# Patient Record
Sex: Female | Born: 1980
Health system: Southern US, Community
[De-identification: ages and names within clinical notes are randomized; demographics above are authoritative.]

## PROBLEM LIST (undated history)

## (undated) ENCOUNTER — Inpatient Hospital Stay (HOSPITAL_COMMUNITY): Payer: Self-pay

## (undated) DIAGNOSIS — I10 Essential (primary) hypertension: Secondary | ICD-10-CM

## (undated) DIAGNOSIS — J45909 Unspecified asthma, uncomplicated: Secondary | ICD-10-CM

## (undated) DIAGNOSIS — R51 Headache: Secondary | ICD-10-CM

## (undated) DIAGNOSIS — D649 Anemia, unspecified: Secondary | ICD-10-CM

## (undated) DIAGNOSIS — R519 Headache, unspecified: Secondary | ICD-10-CM

## (undated) DIAGNOSIS — Z8619 Personal history of other infectious and parasitic diseases: Secondary | ICD-10-CM

## (undated) HISTORY — DX: Anemia, unspecified: D64.9

## (undated) HISTORY — PX: NO PAST SURGERIES: SHX2092

## (undated) HISTORY — DX: Personal history of other infectious and parasitic diseases: Z86.19

---

## 2004-12-06 ENCOUNTER — Ambulatory Visit (HOSPITAL_COMMUNITY): Admission: RE | Admit: 2004-12-06 | Discharge: 2004-12-06 | Payer: Self-pay | Admitting: *Deleted

## 2005-01-28 ENCOUNTER — Ambulatory Visit (HOSPITAL_COMMUNITY): Admission: RE | Admit: 2005-01-28 | Discharge: 2005-01-28 | Payer: Self-pay | Admitting: *Deleted

## 2005-05-05 ENCOUNTER — Inpatient Hospital Stay (HOSPITAL_COMMUNITY): Admission: AD | Admit: 2005-05-05 | Discharge: 2005-05-05 | Payer: Self-pay | Admitting: *Deleted

## 2005-05-05 ENCOUNTER — Ambulatory Visit: Payer: Self-pay | Admitting: Obstetrics and Gynecology

## 2005-05-05 ENCOUNTER — Inpatient Hospital Stay (HOSPITAL_COMMUNITY): Admission: AD | Admit: 2005-05-05 | Discharge: 2005-05-08 | Payer: Self-pay | Admitting: *Deleted

## 2007-01-02 ENCOUNTER — Inpatient Hospital Stay (HOSPITAL_COMMUNITY): Admission: AD | Admit: 2007-01-02 | Discharge: 2007-01-02 | Payer: Self-pay | Admitting: Obstetrics & Gynecology

## 2007-03-11 ENCOUNTER — Ambulatory Visit (HOSPITAL_COMMUNITY): Admission: RE | Admit: 2007-03-11 | Discharge: 2007-03-11 | Payer: Self-pay | Admitting: Family Medicine

## 2007-04-20 ENCOUNTER — Ambulatory Visit (HOSPITAL_COMMUNITY): Admission: RE | Admit: 2007-04-20 | Discharge: 2007-04-20 | Payer: Self-pay | Admitting: Family Medicine

## 2007-04-23 ENCOUNTER — Observation Stay: Payer: Self-pay

## 2007-05-19 ENCOUNTER — Inpatient Hospital Stay (HOSPITAL_COMMUNITY): Admission: AD | Admit: 2007-05-19 | Discharge: 2007-05-22 | Payer: Self-pay | Admitting: Obstetrics & Gynecology

## 2007-05-19 ENCOUNTER — Ambulatory Visit: Payer: Self-pay | Admitting: Obstetrics & Gynecology

## 2007-05-20 ENCOUNTER — Encounter: Payer: Self-pay | Admitting: Obstetrics & Gynecology

## 2007-05-20 DIAGNOSIS — O1415 Severe pre-eclampsia, complicating the puerperium: Secondary | ICD-10-CM

## 2009-02-19 IMAGING — US US OB FOLLOW-UP
1 series · 14 of 28 positions shown · non-contrast
Comparison: none

OBSTETRICAL ULTRASOUND:

 This ultrasound exam was performed in the [HOSPITAL] Ultrasound Department.  The OB US report was generated in the AS system, and faxed to the ordering physician.  This report is also available in [REDACTED] PACS.

[Series 1: us ob follow-up · 0.32mm/px · 14 of 38 slices shown]
[im 2/38]
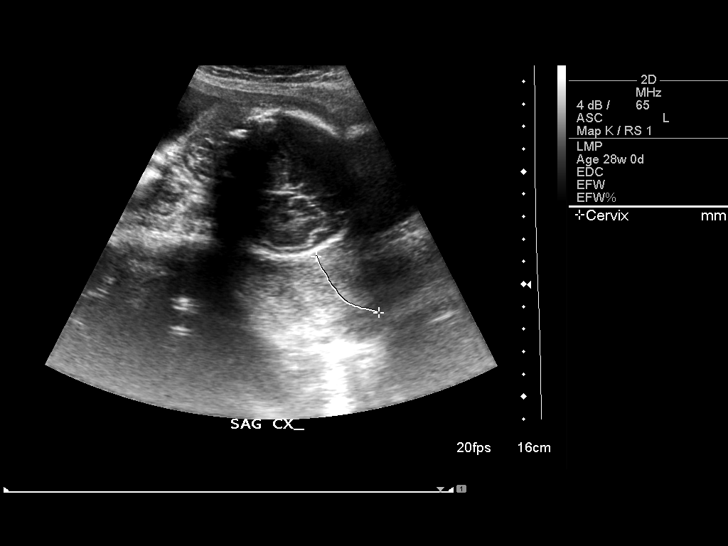
[im 5/38]
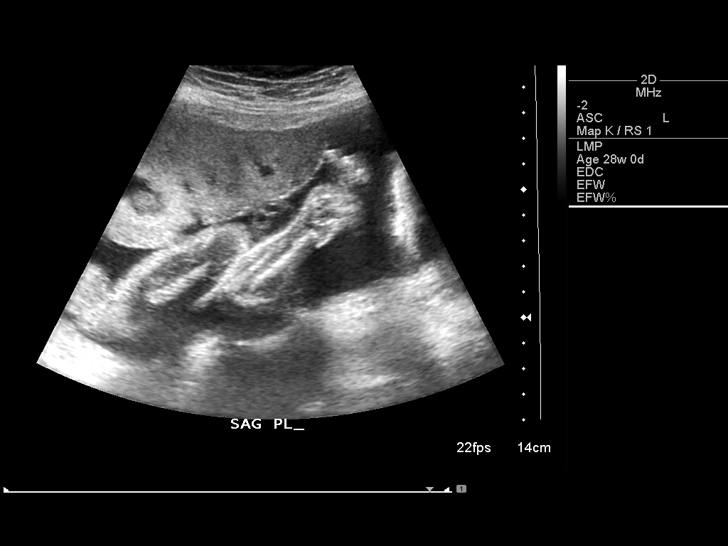
[im 7/38]
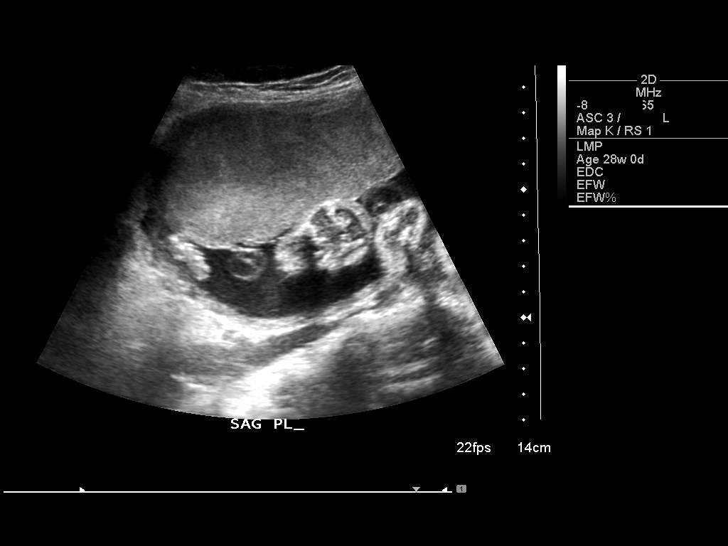
[im 10/38]
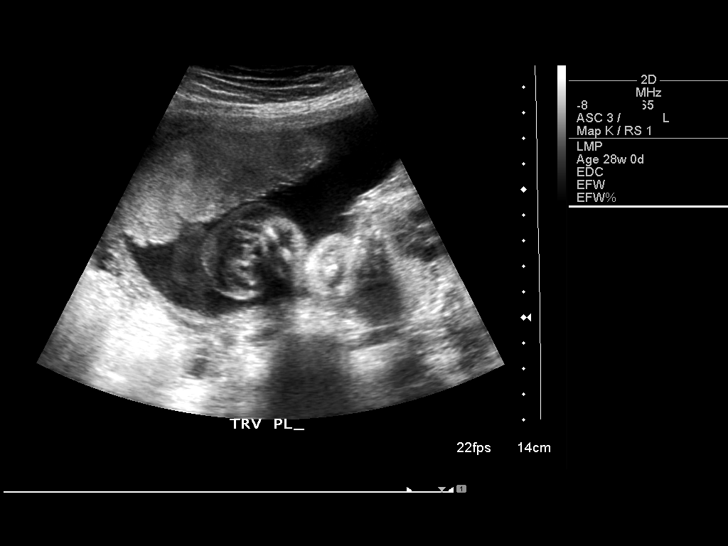
[im 13/38]
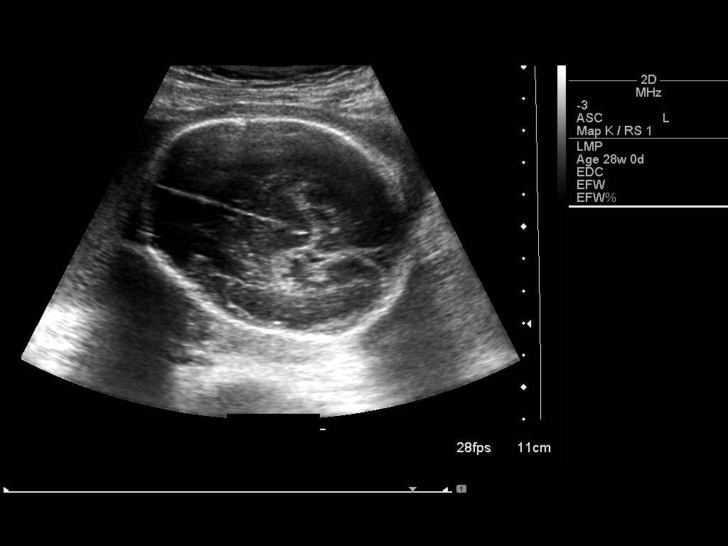
[im 16/38]
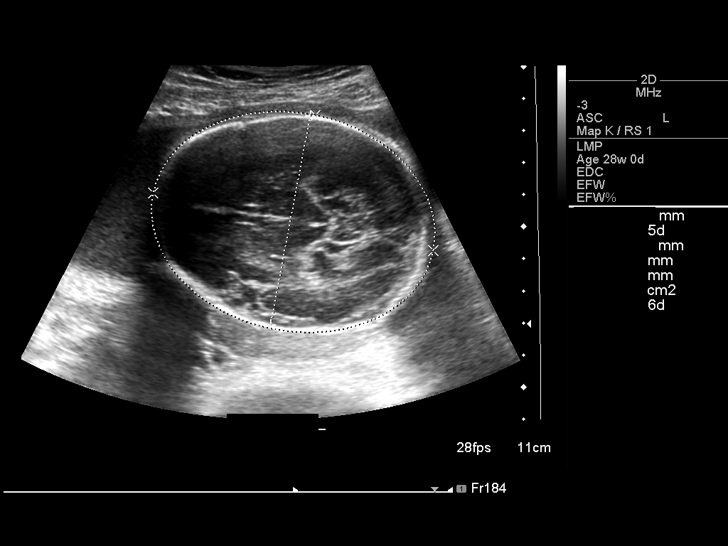
[im 18/38]
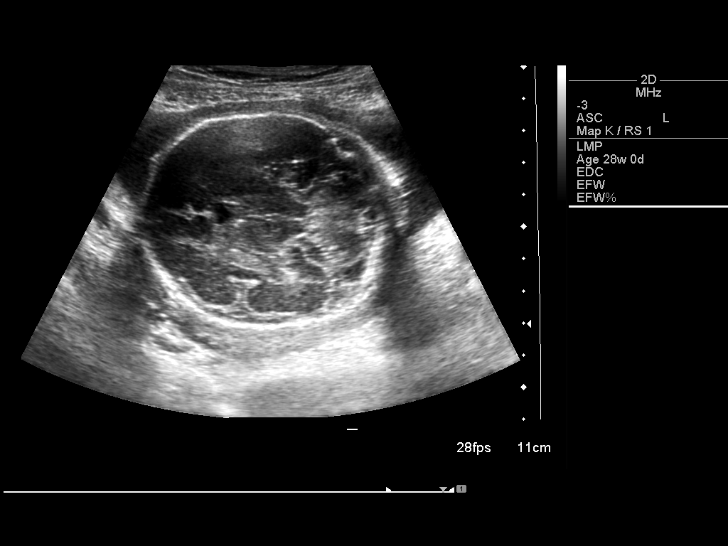
[im 21/38]
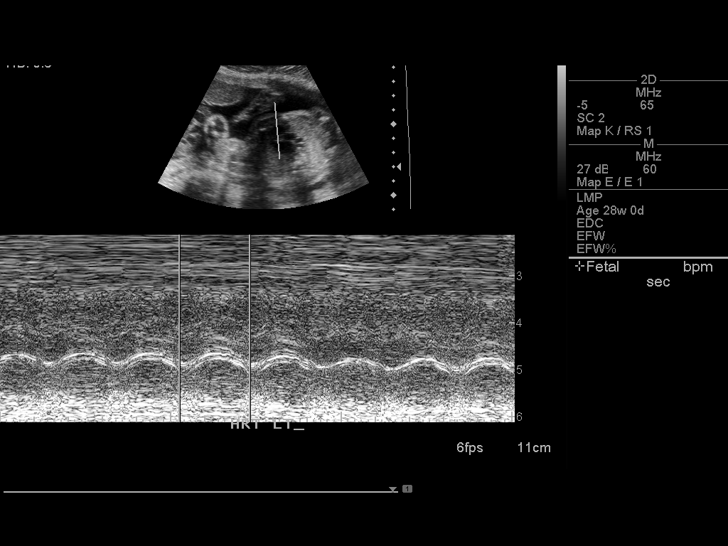
[im 24/38]
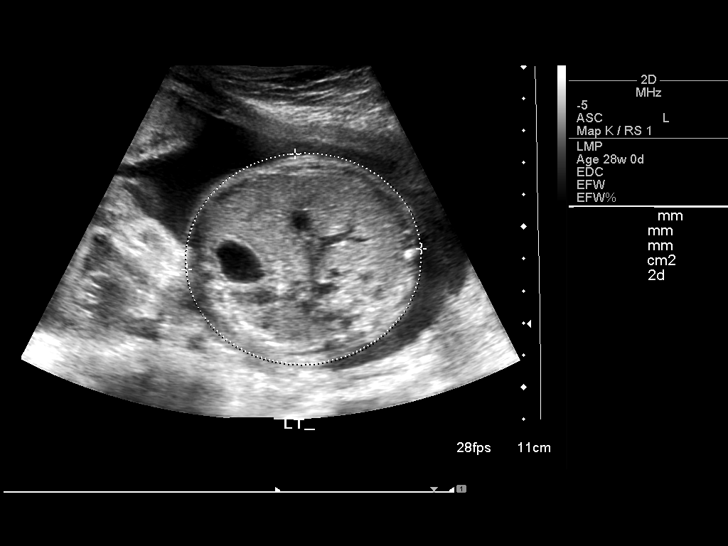
[im 27/38]
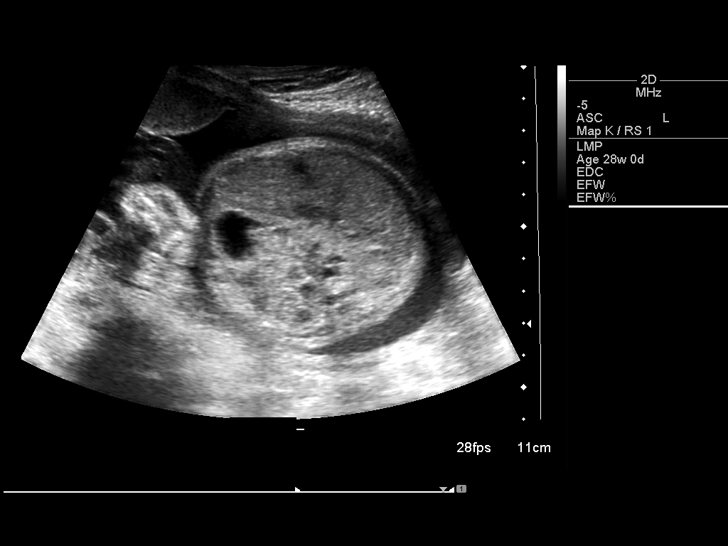
[im 29/38]
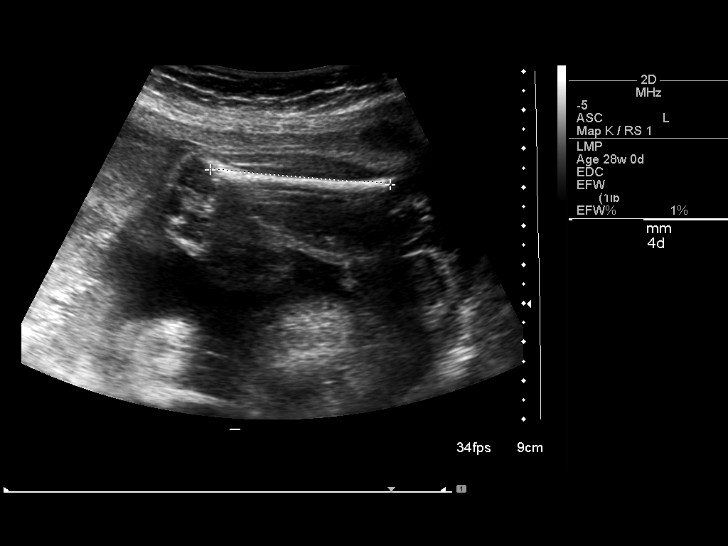
[im 32/38]
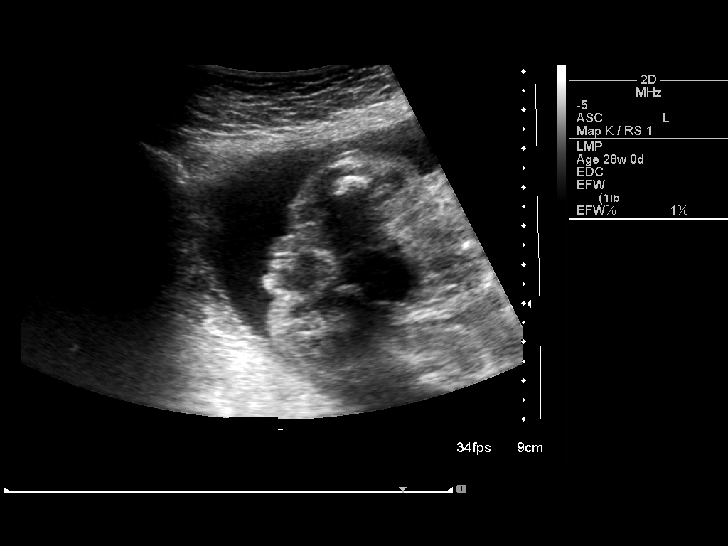
[im 35/38]
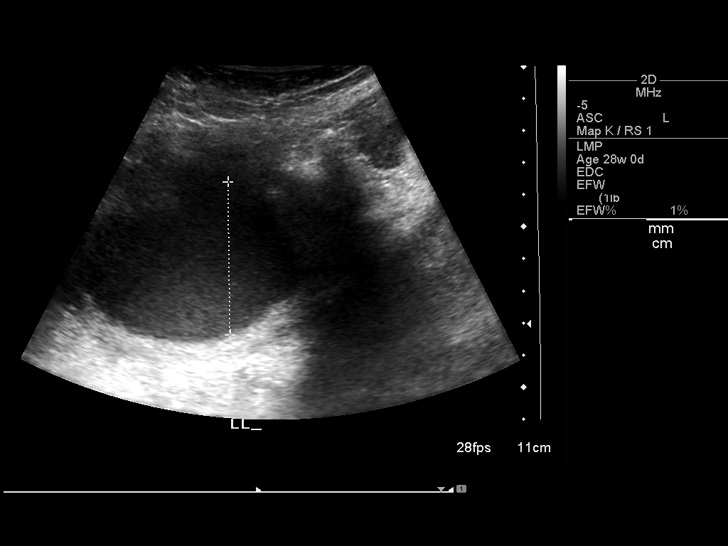
[im 38/38]
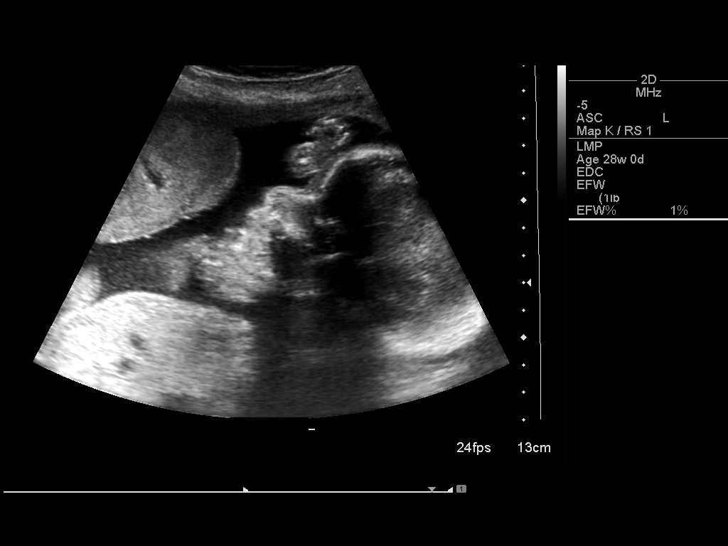

[14 of 28 positions shown; findings below may reference images not displayed]

IMPRESSION: See AS Obstetric US report.

## 2010-12-12 ENCOUNTER — Other Ambulatory Visit: Payer: Self-pay | Admitting: Family Medicine

## 2010-12-12 DIAGNOSIS — Z3689 Encounter for other specified antenatal screening: Secondary | ICD-10-CM

## 2010-12-13 ENCOUNTER — Ambulatory Visit (HOSPITAL_COMMUNITY)
Admission: RE | Admit: 2010-12-13 | Discharge: 2010-12-13 | Disposition: A | Discharge status: Home / Self Care | Payer: Medicaid Other | Source: Ambulatory Visit | Attending: Family Medicine | Admitting: Family Medicine

## 2010-12-13 DIAGNOSIS — O09299 Supervision of pregnancy with other poor reproductive or obstetric history, unspecified trimester: Secondary | ICD-10-CM | POA: Insufficient documentation

## 2010-12-13 DIAGNOSIS — Z363 Encounter for antenatal screening for malformations: Secondary | ICD-10-CM | POA: Insufficient documentation

## 2010-12-13 DIAGNOSIS — Z1389 Encounter for screening for other disorder: Secondary | ICD-10-CM | POA: Insufficient documentation

## 2010-12-13 DIAGNOSIS — Z3689 Encounter for other specified antenatal screening: Secondary | ICD-10-CM

## 2010-12-13 DIAGNOSIS — O358XX0 Maternal care for other (suspected) fetal abnormality and damage, not applicable or unspecified: Secondary | ICD-10-CM | POA: Insufficient documentation

## 2010-12-20 ENCOUNTER — Other Ambulatory Visit: Payer: Self-pay

## 2010-12-20 DIAGNOSIS — O09299 Supervision of pregnancy with other poor reproductive or obstetric history, unspecified trimester: Secondary | ICD-10-CM

## 2010-12-20 LAB — POCT URINALYSIS DIPSTICK
Nitrite: NEGATIVE
Urine Glucose, Fasting: NEGATIVE mg/dL
Urobilinogen, UA: 0.2 mg/dL (ref 0.0–1.0)
pH: 7 (ref 5.0–8.0)

## 2011-01-10 ENCOUNTER — Encounter: Payer: Self-pay | Admitting: Obstetrics & Gynecology

## 2011-01-10 ENCOUNTER — Other Ambulatory Visit: Payer: Self-pay

## 2011-01-10 DIAGNOSIS — O09299 Supervision of pregnancy with other poor reproductive or obstetric history, unspecified trimester: Secondary | ICD-10-CM

## 2011-01-10 LAB — POCT URINALYSIS DIPSTICK
Nitrite: NEGATIVE
Protein, ur: 100 mg/dL — AB
pH: 7.5 (ref 5.0–8.0)

## 2011-01-31 ENCOUNTER — Other Ambulatory Visit: Payer: Self-pay | Admitting: Family Medicine

## 2011-01-31 ENCOUNTER — Other Ambulatory Visit: Payer: Self-pay | Admitting: Obstetrics & Gynecology

## 2011-01-31 DIAGNOSIS — O09299 Supervision of pregnancy with other poor reproductive or obstetric history, unspecified trimester: Secondary | ICD-10-CM

## 2011-01-31 DIAGNOSIS — Z331 Pregnant state, incidental: Secondary | ICD-10-CM

## 2011-01-31 DIAGNOSIS — O4440 Low lying placenta NOS or without hemorrhage, unspecified trimester: Secondary | ICD-10-CM

## 2011-01-31 LAB — POCT URINALYSIS DIP (DEVICE)
Nitrite: NEGATIVE
Protein, ur: 30 mg/dL — AB
Urobilinogen, UA: 1 mg/dL (ref 0.0–1.0)

## 2011-02-21 ENCOUNTER — Other Ambulatory Visit: Payer: Self-pay | Admitting: Obstetrics & Gynecology

## 2011-02-21 ENCOUNTER — Ambulatory Visit (HOSPITAL_COMMUNITY)
Admission: RE | Admit: 2011-02-21 | Discharge: 2011-02-21 | Disposition: A | Discharge status: Home / Self Care | Payer: Medicaid Other | Source: Ambulatory Visit | Attending: Obstetrics & Gynecology | Admitting: Obstetrics & Gynecology

## 2011-02-21 DIAGNOSIS — O09299 Supervision of pregnancy with other poor reproductive or obstetric history, unspecified trimester: Secondary | ICD-10-CM

## 2011-02-21 DIAGNOSIS — O4400 Placenta previa specified as without hemorrhage, unspecified trimester: Secondary | ICD-10-CM | POA: Insufficient documentation

## 2011-02-21 DIAGNOSIS — O4440 Low lying placenta NOS or without hemorrhage, unspecified trimester: Secondary | ICD-10-CM

## 2011-02-21 DIAGNOSIS — Z331 Pregnant state, incidental: Secondary | ICD-10-CM

## 2011-02-21 LAB — POCT URINALYSIS DIP (DEVICE)
Glucose, UA: NEGATIVE mg/dL
Nitrite: NEGATIVE
Urobilinogen, UA: 0.2 mg/dL (ref 0.0–1.0)

## 2011-02-28 ENCOUNTER — Other Ambulatory Visit: Payer: Self-pay | Admitting: Family Medicine

## 2011-02-28 DIAGNOSIS — O09299 Supervision of pregnancy with other poor reproductive or obstetric history, unspecified trimester: Secondary | ICD-10-CM

## 2011-02-28 DIAGNOSIS — Z331 Pregnant state, incidental: Secondary | ICD-10-CM

## 2011-02-28 LAB — POCT URINALYSIS DIP (DEVICE)
Bilirubin Urine: NEGATIVE
Glucose, UA: NEGATIVE mg/dL
Nitrite: NEGATIVE
pH: 7.5 (ref 5.0–8.0)

## 2011-03-04 ENCOUNTER — Other Ambulatory Visit: Payer: Medicaid Other

## 2011-03-04 DIAGNOSIS — O09299 Supervision of pregnancy with other poor reproductive or obstetric history, unspecified trimester: Secondary | ICD-10-CM

## 2011-03-07 ENCOUNTER — Other Ambulatory Visit: Payer: Self-pay | Admitting: Obstetrics and Gynecology

## 2011-03-07 ENCOUNTER — Other Ambulatory Visit: Payer: Medicaid Other

## 2011-03-07 DIAGNOSIS — Z331 Pregnant state, incidental: Secondary | ICD-10-CM

## 2011-03-07 DIAGNOSIS — O09299 Supervision of pregnancy with other poor reproductive or obstetric history, unspecified trimester: Secondary | ICD-10-CM

## 2011-03-07 DIAGNOSIS — O368190 Decreased fetal movements, unspecified trimester, not applicable or unspecified: Secondary | ICD-10-CM

## 2011-03-07 LAB — POCT URINALYSIS DIP (DEVICE)
Ketones, ur: NEGATIVE mg/dL
Protein, ur: NEGATIVE mg/dL

## 2011-03-11 ENCOUNTER — Other Ambulatory Visit: Payer: Medicaid Other

## 2011-03-11 DIAGNOSIS — O09299 Supervision of pregnancy with other poor reproductive or obstetric history, unspecified trimester: Secondary | ICD-10-CM

## 2011-03-14 ENCOUNTER — Other Ambulatory Visit: Payer: Self-pay | Admitting: Physician Assistant

## 2011-03-14 ENCOUNTER — Other Ambulatory Visit: Payer: Self-pay | Admitting: Obstetrics & Gynecology

## 2011-03-14 ENCOUNTER — Other Ambulatory Visit: Payer: Medicaid Other

## 2011-03-14 DIAGNOSIS — Z331 Pregnant state, incidental: Secondary | ICD-10-CM

## 2011-03-14 DIAGNOSIS — O09299 Supervision of pregnancy with other poor reproductive or obstetric history, unspecified trimester: Secondary | ICD-10-CM

## 2011-03-14 LAB — POCT URINALYSIS DIP (DEVICE)
Glucose, UA: NEGATIVE mg/dL
Nitrite: NEGATIVE
Specific Gravity, Urine: 1.02 (ref 1.005–1.030)
Urobilinogen, UA: 0.2 mg/dL (ref 0.0–1.0)

## 2011-03-18 ENCOUNTER — Other Ambulatory Visit: Payer: Medicaid Other

## 2011-03-18 DIAGNOSIS — O09299 Supervision of pregnancy with other poor reproductive or obstetric history, unspecified trimester: Secondary | ICD-10-CM

## 2011-03-21 ENCOUNTER — Other Ambulatory Visit: Payer: Medicaid Other

## 2011-03-21 ENCOUNTER — Other Ambulatory Visit: Payer: Self-pay | Admitting: Obstetrics & Gynecology

## 2011-03-21 DIAGNOSIS — O09299 Supervision of pregnancy with other poor reproductive or obstetric history, unspecified trimester: Secondary | ICD-10-CM

## 2011-03-21 LAB — POCT URINALYSIS DIP (DEVICE)
Bilirubin Urine: NEGATIVE
Glucose, UA: NEGATIVE mg/dL
Nitrite: NEGATIVE

## 2011-03-22 ENCOUNTER — Ambulatory Visit (HOSPITAL_COMMUNITY)
Admission: RE | Admit: 2011-03-22 | Discharge: 2011-03-22 | Disposition: A | Discharge status: Home / Self Care | Payer: Medicaid Other | Source: Ambulatory Visit | Attending: Obstetrics & Gynecology | Admitting: Obstetrics & Gynecology

## 2011-03-22 DIAGNOSIS — Z3689 Encounter for other specified antenatal screening: Secondary | ICD-10-CM | POA: Insufficient documentation

## 2011-03-22 DIAGNOSIS — O09299 Supervision of pregnancy with other poor reproductive or obstetric history, unspecified trimester: Secondary | ICD-10-CM

## 2011-03-25 ENCOUNTER — Other Ambulatory Visit: Payer: Medicaid Other

## 2011-03-25 DIAGNOSIS — O09299 Supervision of pregnancy with other poor reproductive or obstetric history, unspecified trimester: Secondary | ICD-10-CM

## 2011-03-28 ENCOUNTER — Other Ambulatory Visit: Payer: Self-pay | Admitting: Obstetrics and Gynecology

## 2011-03-28 ENCOUNTER — Other Ambulatory Visit: Payer: Medicaid Other

## 2011-03-28 DIAGNOSIS — O09299 Supervision of pregnancy with other poor reproductive or obstetric history, unspecified trimester: Secondary | ICD-10-CM

## 2011-03-28 LAB — POCT URINALYSIS DIP (DEVICE)
Bilirubin Urine: NEGATIVE
Glucose, UA: NEGATIVE mg/dL
Ketones, ur: NEGATIVE mg/dL

## 2011-04-02 ENCOUNTER — Other Ambulatory Visit: Payer: Medicaid Other

## 2011-04-02 DIAGNOSIS — O09299 Supervision of pregnancy with other poor reproductive or obstetric history, unspecified trimester: Secondary | ICD-10-CM

## 2011-04-04 ENCOUNTER — Other Ambulatory Visit: Payer: Self-pay | Admitting: Obstetrics and Gynecology

## 2011-04-04 ENCOUNTER — Other Ambulatory Visit: Payer: Medicaid Other

## 2011-04-04 DIAGNOSIS — O09299 Supervision of pregnancy with other poor reproductive or obstetric history, unspecified trimester: Secondary | ICD-10-CM

## 2011-04-04 LAB — POCT URINALYSIS DIP (DEVICE)
Bilirubin Urine: NEGATIVE
Glucose, UA: NEGATIVE mg/dL
Ketones, ur: NEGATIVE mg/dL
Specific Gravity, Urine: 1.015 (ref 1.005–1.030)

## 2011-04-08 ENCOUNTER — Other Ambulatory Visit: Payer: Medicaid Other

## 2011-04-08 DIAGNOSIS — O09299 Supervision of pregnancy with other poor reproductive or obstetric history, unspecified trimester: Secondary | ICD-10-CM

## 2011-04-11 ENCOUNTER — Other Ambulatory Visit: Payer: Medicaid Other

## 2011-04-11 ENCOUNTER — Other Ambulatory Visit: Payer: Self-pay | Admitting: Obstetrics & Gynecology

## 2011-04-11 DIAGNOSIS — O09299 Supervision of pregnancy with other poor reproductive or obstetric history, unspecified trimester: Secondary | ICD-10-CM

## 2011-04-11 LAB — POCT URINALYSIS DIP (DEVICE)
Bilirubin Urine: NEGATIVE
Glucose, UA: NEGATIVE mg/dL
Specific Gravity, Urine: 1.01 (ref 1.005–1.030)

## 2011-04-15 ENCOUNTER — Other Ambulatory Visit: Payer: Self-pay | Admitting: Obstetrics & Gynecology

## 2011-04-15 ENCOUNTER — Other Ambulatory Visit: Payer: Medicaid Other

## 2011-04-15 DIAGNOSIS — O4190X0 Disorder of amniotic fluid and membranes, unspecified, unspecified trimester, not applicable or unspecified: Secondary | ICD-10-CM

## 2011-04-15 DIAGNOSIS — O09299 Supervision of pregnancy with other poor reproductive or obstetric history, unspecified trimester: Secondary | ICD-10-CM

## 2011-04-16 ENCOUNTER — Ambulatory Visit (HOSPITAL_COMMUNITY): Payer: Medicaid Other

## 2011-04-18 ENCOUNTER — Ambulatory Visit (HOSPITAL_COMMUNITY)
Admission: RE | Admit: 2011-04-18 | Discharge: 2011-04-18 | Disposition: A | Discharge status: Home / Self Care | Payer: Medicaid Other | Source: Ambulatory Visit | Attending: Obstetrics and Gynecology | Admitting: Obstetrics and Gynecology

## 2011-04-18 ENCOUNTER — Other Ambulatory Visit: Payer: Self-pay | Admitting: Obstetrics & Gynecology

## 2011-04-18 ENCOUNTER — Other Ambulatory Visit: Payer: Medicaid Other

## 2011-04-18 DIAGNOSIS — O09299 Supervision of pregnancy with other poor reproductive or obstetric history, unspecified trimester: Secondary | ICD-10-CM | POA: Insufficient documentation

## 2011-04-18 DIAGNOSIS — Z3689 Encounter for other specified antenatal screening: Secondary | ICD-10-CM | POA: Insufficient documentation

## 2011-04-18 LAB — POCT URINALYSIS DIP (DEVICE)
Bilirubin Urine: NEGATIVE
Glucose, UA: NEGATIVE mg/dL
Ketones, ur: NEGATIVE mg/dL
Leukocytes, UA: NEGATIVE
pH: 7 (ref 5.0–8.0)

## 2011-04-22 ENCOUNTER — Other Ambulatory Visit: Payer: Medicaid Other

## 2011-04-22 DIAGNOSIS — O09299 Supervision of pregnancy with other poor reproductive or obstetric history, unspecified trimester: Secondary | ICD-10-CM

## 2011-04-25 ENCOUNTER — Other Ambulatory Visit: Payer: Self-pay | Admitting: Family Medicine

## 2011-04-25 ENCOUNTER — Ambulatory Visit (HOSPITAL_COMMUNITY)
Admission: RE | Admit: 2011-04-25 | Discharge: 2011-04-25 | Disposition: A | Discharge status: Home / Self Care | Payer: Medicaid Other | Source: Ambulatory Visit | Attending: Obstetrics & Gynecology | Admitting: Obstetrics & Gynecology

## 2011-04-25 ENCOUNTER — Other Ambulatory Visit: Payer: Medicaid Other

## 2011-04-25 DIAGNOSIS — O09299 Supervision of pregnancy with other poor reproductive or obstetric history, unspecified trimester: Secondary | ICD-10-CM

## 2011-04-25 DIAGNOSIS — O4190X0 Disorder of amniotic fluid and membranes, unspecified, unspecified trimester, not applicable or unspecified: Secondary | ICD-10-CM

## 2011-04-25 DIAGNOSIS — Z3689 Encounter for other specified antenatal screening: Secondary | ICD-10-CM | POA: Insufficient documentation

## 2011-04-25 LAB — POCT URINALYSIS DIP (DEVICE)
Bilirubin Urine: NEGATIVE
Ketones, ur: NEGATIVE mg/dL
Protein, ur: NEGATIVE mg/dL
Specific Gravity, Urine: 1.01 (ref 1.005–1.030)

## 2011-04-29 ENCOUNTER — Other Ambulatory Visit: Payer: Medicaid Other

## 2011-04-29 DIAGNOSIS — O09299 Supervision of pregnancy with other poor reproductive or obstetric history, unspecified trimester: Secondary | ICD-10-CM

## 2011-05-02 ENCOUNTER — Other Ambulatory Visit: Payer: Medicaid Other

## 2011-05-02 ENCOUNTER — Other Ambulatory Visit: Payer: Self-pay | Admitting: Obstetrics & Gynecology

## 2011-05-02 ENCOUNTER — Ambulatory Visit (HOSPITAL_COMMUNITY)
Admission: RE | Admit: 2011-05-02 | Discharge: 2011-05-02 | Disposition: A | Discharge status: Home / Self Care | Payer: Medicaid Other | Source: Ambulatory Visit | Attending: Obstetrics & Gynecology | Admitting: Obstetrics & Gynecology

## 2011-05-02 DIAGNOSIS — Z3689 Encounter for other specified antenatal screening: Secondary | ICD-10-CM | POA: Insufficient documentation

## 2011-05-02 DIAGNOSIS — O4190X0 Disorder of amniotic fluid and membranes, unspecified, unspecified trimester, not applicable or unspecified: Secondary | ICD-10-CM

## 2011-05-02 DIAGNOSIS — O09299 Supervision of pregnancy with other poor reproductive or obstetric history, unspecified trimester: Secondary | ICD-10-CM

## 2011-05-05 ENCOUNTER — Inpatient Hospital Stay (HOSPITAL_COMMUNITY)
Admission: AD | Admit: 2011-05-05 | Discharge: 2011-05-07 | DRG: 775 | Disposition: A | Discharge status: Home / Self Care | Payer: Medicaid Other | Source: Ambulatory Visit | Attending: Obstetrics & Gynecology | Admitting: Obstetrics & Gynecology

## 2011-05-05 LAB — CBC
HCT: 33.2 % — ABNORMAL LOW (ref 36.0–46.0)
Hemoglobin: 10.8 g/dL — ABNORMAL LOW (ref 12.0–15.0)
MCV: 76 fL — ABNORMAL LOW (ref 78.0–100.0)
RDW: 15 % (ref 11.5–15.5)
WBC: 10.3 10*3/uL (ref 4.0–10.5)

## 2011-05-05 LAB — RPR: RPR Ser Ql: NONREACTIVE

## 2011-05-06 ENCOUNTER — Other Ambulatory Visit: Payer: Medicaid Other

## 2011-05-06 LAB — CBC
Hemoglobin: 9.5 g/dL — ABNORMAL LOW (ref 12.0–15.0)
MCH: 24.7 pg — ABNORMAL LOW (ref 26.0–34.0)
MCHC: 32.4 g/dL (ref 30.0–36.0)
RDW: 15 % (ref 11.5–15.5)

## 2011-05-31 DIAGNOSIS — F32A Depression, unspecified: Secondary | ICD-10-CM

## 2011-05-31 DIAGNOSIS — F329 Major depressive disorder, single episode, unspecified: Secondary | ICD-10-CM | POA: Insufficient documentation

## 2011-06-12 ENCOUNTER — Ambulatory Visit (INDEPENDENT_AMBULATORY_CARE_PROVIDER_SITE_OTHER): Payer: Medicaid Other | Admitting: Obstetrics and Gynecology

## 2011-06-12 ENCOUNTER — Encounter: Payer: Self-pay | Admitting: Obstetrics and Gynecology

## 2011-06-12 MED ORDER — NATALCARE PIC 60-1 MG PO TABS: 1.0000 | ORAL_TABLET | Freq: Every day | ORAL | Status: DC

## 2011-06-12 NOTE — Progress Notes (Signed)
Subjective:     Patient ID: Danielle Hoover, female   DOB: 1980/12/15, 30 y.o.   MRN: [id number]  HPI the patient is a gravida 3 para 12/05/2000 and delivered on July 1 L. infant weighing 6 lbs. 14 oz. Apgar 99. Patient had no tears delivery went fine and she has done well with her nursing the past 6 weeks her only complaint is a mild vaginal itch. She did not need a Pap smear in collects February her last 1 was normal   Review of Systems Review of systems is negative with exception of present illness    Objective:   Physical Exam Patient's blood pressure was normal at 107/76 pulse is 86.  Reason is a well-developed [place] young woman in no distress. Breasts are lactating symmetrical no masses. Abdomen is soft nontender without masses no organomegaly. Genitalia external normal BUS within normal limits vagina is clean and well rugated cervix is clean and parous no significant vaginal discharge wet prep was taken. Uterus is normal size shape consistency.    Assessment:    normal postpartum examination    Plan:     Patient wants no contraception so we've told her to continue on her birth control pills and was sent in order to her pharmacy.

## 2011-06-14 LAB — WET PREP, GENITAL
Trich, Wet Prep: NONE SEEN
Yeast Wet Prep HPF POC: NONE SEEN

## 2011-06-16 ENCOUNTER — Inpatient Hospital Stay (INDEPENDENT_AMBULATORY_CARE_PROVIDER_SITE_OTHER)
Admission: RE | Admit: 2011-06-16 | Discharge: 2011-06-16 | Disposition: A | Discharge status: Home / Self Care | Payer: Medicaid Other | Source: Ambulatory Visit | Attending: Family Medicine | Admitting: Family Medicine

## 2011-06-16 ENCOUNTER — Emergency Department (HOSPITAL_COMMUNITY): Payer: Medicaid Other

## 2011-06-16 ENCOUNTER — Emergency Department (HOSPITAL_COMMUNITY)
Admission: EM | Admit: 2011-06-16 | Discharge: 2011-06-17 | Disposition: A | Discharge status: Home / Self Care | Payer: Medicaid Other | Attending: Emergency Medicine | Admitting: Emergency Medicine

## 2011-06-16 DIAGNOSIS — R109 Unspecified abdominal pain: Secondary | ICD-10-CM | POA: Insufficient documentation

## 2011-06-16 DIAGNOSIS — R1011 Right upper quadrant pain: Secondary | ICD-10-CM

## 2011-06-16 DIAGNOSIS — R319 Hematuria, unspecified: Secondary | ICD-10-CM

## 2011-06-16 DIAGNOSIS — K819 Cholecystitis, unspecified: Secondary | ICD-10-CM | POA: Insufficient documentation

## 2011-06-16 LAB — POCT URINALYSIS DIP (DEVICE)
Bilirubin Urine: NEGATIVE
Bilirubin Urine: NEGATIVE
Glucose, UA: NEGATIVE mg/dL
Glucose, UA: NEGATIVE mg/dL
Ketones, ur: NEGATIVE mg/dL
Leukocytes, UA: NEGATIVE
Nitrite: NEGATIVE
Nitrite: NEGATIVE
Protein, ur: 30 mg/dL — AB
Specific Gravity, Urine: 1.02 (ref 1.005–1.030)
Specific Gravity, Urine: 1.02 (ref 1.005–1.030)
Urobilinogen, UA: 0.2 mg/dL (ref 0.0–1.0)
Urobilinogen, UA: 0.2 mg/dL (ref 0.0–1.0)
pH: 7 (ref 5.0–8.0)
pH: 7.5 (ref 5.0–8.0)

## 2011-06-17 LAB — CBC
HCT: 36.4 % (ref 36.0–46.0)
Hemoglobin: 12.2 g/dL (ref 12.0–15.0)
MCH: 24.4 pg — ABNORMAL LOW (ref 26.0–34.0)
MCHC: 33.5 g/dL (ref 30.0–36.0)
MCV: 72.9 fL — ABNORMAL LOW (ref 78.0–100.0)
RDW: 15.1 % (ref 11.5–15.5)

## 2011-06-17 LAB — URINALYSIS, ROUTINE W REFLEX MICROSCOPIC
Bilirubin Urine: NEGATIVE
Leukocytes, UA: NEGATIVE
Nitrite: NEGATIVE
Specific Gravity, Urine: 1.018 (ref 1.005–1.030)
Urobilinogen, UA: 0.2 mg/dL (ref 0.0–1.0)

## 2011-06-17 LAB — COMPREHENSIVE METABOLIC PANEL
Alkaline Phosphatase: 103 U/L (ref 39–117)
BUN: 9 mg/dL (ref 6–23)
Calcium: 9.3 mg/dL (ref 8.4–10.5)
Creatinine, Ser: <0.47 mg/dL — ABNORMAL LOW (ref 0.50–1.10)
Glucose, Bld: 93 mg/dL (ref 70–99)
Total Protein: 6.6 g/dL (ref 6.0–8.3)

## 2011-06-17 LAB — URINE MICROSCOPIC-ADD ON

## 2011-06-17 LAB — DIFFERENTIAL
Eosinophils Relative: 4 % (ref 0–5)
Lymphocytes Relative: 31 % (ref 12–46)
Monocytes Absolute: 0.5 10*3/uL (ref 0.1–1.0)
Monocytes Relative: 5 % (ref 3–12)
Neutro Abs: 6 10*3/uL (ref 1.7–7.7)

## 2011-06-17 LAB — LIPASE, BLOOD: Lipase: 34 U/L (ref 11–59)

## 2011-08-19 LAB — COMPREHENSIVE METABOLIC PANEL
ALT: 13
AST: 23
Albumin: 2.1 — ABNORMAL LOW
Alkaline Phosphatase: 281 — ABNORMAL HIGH
BUN: 5 — ABNORMAL LOW
CO2: 27
Calcium: 6.8 — ABNORMAL LOW
Calcium: 8.2 — ABNORMAL LOW
Chloride: 104
Creatinine, Ser: 0.49
GFR calc Af Amer: >60
GFR calc non Af Amer: >60
Glucose, Bld: 83
Potassium: 3.8
Total Bilirubin: 0.4
Total Protein: 5.5 — ABNORMAL LOW

## 2011-08-19 LAB — TYPE AND SCREEN
ABO/RH(D): AB POS
PT AG Type: NEGATIVE

## 2011-08-19 LAB — CBC
HCT: 31.2 — ABNORMAL LOW
HCT: 32.5 — ABNORMAL LOW
Hemoglobin: 10.9 — ABNORMAL LOW
MCHC: 32.8
MCHC: 33.4
MCV: 77.4 — ABNORMAL LOW
Platelets: 231
RBC: 4.03
RDW: 14.1 — ABNORMAL HIGH
WBC: 11.7 — ABNORMAL HIGH

## 2011-08-19 LAB — TORCH-IGM(TOXO/ RUB/ CMV/ HSV) W TITER
HSV IgM Antibody Titer: 0.54 IV
Rubella IgM Index: 0.39 IV
Toxoplasma IgM: 0.48 IV

## 2011-08-19 LAB — URINALYSIS, DIPSTICK ONLY
Bilirubin Urine: NEGATIVE
Glucose, UA: NEGATIVE
Protein, ur: 100 — AB
Urobilinogen, UA: 0.2

## 2011-08-19 LAB — CARDIAC PANEL(CRET KIN+CKTOT+MB+TROPI)
CK, MB: 0.8
CK, MB: 1.4
Relative Index: INVALID
Total CK: 75
Total CK: 76
Troponin I: 0.02
Troponin I: 0.04

## 2011-08-19 LAB — MAGNESIUM: Magnesium: 6 — ABNORMAL HIGH

## 2011-08-19 LAB — URIC ACID: Uric Acid, Serum: 7

## 2011-08-19 LAB — TORCH TITERS-IGG(TOXO/ RUB/ CMV/ HSV): Rubella IgG Scr: 15 IU/mL

## 2011-08-19 LAB — LACTATE DEHYDROGENASE: LDH: 180

## 2014-08-15 ENCOUNTER — Inpatient Hospital Stay (HOSPITAL_COMMUNITY)
Admission: AD | Admit: 2014-08-15 | Discharge: 2014-08-15 | Disposition: A | Discharge status: Home / Self Care | Payer: BC Managed Care – PPO | Source: Ambulatory Visit | Attending: Family Medicine | Admitting: Family Medicine

## 2014-08-15 ENCOUNTER — Inpatient Hospital Stay (HOSPITAL_COMMUNITY): Payer: BC Managed Care – PPO

## 2014-08-15 ENCOUNTER — Encounter (HOSPITAL_COMMUNITY): Payer: Self-pay | Admitting: *Deleted

## 2014-08-15 DIAGNOSIS — O468X1 Other antepartum hemorrhage, first trimester: Secondary | ICD-10-CM

## 2014-08-15 DIAGNOSIS — Z3A01 Less than 8 weeks gestation of pregnancy: Secondary | ICD-10-CM | POA: Diagnosis not present

## 2014-08-15 DIAGNOSIS — O208 Other hemorrhage in early pregnancy: Secondary | ICD-10-CM | POA: Diagnosis not present

## 2014-08-15 DIAGNOSIS — O418X10 Other specified disorders of amniotic fluid and membranes, first trimester, not applicable or unspecified: Secondary | ICD-10-CM

## 2014-08-15 DIAGNOSIS — O4691 Antepartum hemorrhage, unspecified, first trimester: Secondary | ICD-10-CM

## 2014-08-15 DIAGNOSIS — R1033 Periumbilical pain: Secondary | ICD-10-CM | POA: Insufficient documentation

## 2014-08-15 DIAGNOSIS — R109 Unspecified abdominal pain: Secondary | ICD-10-CM | POA: Diagnosis present

## 2014-08-15 DIAGNOSIS — O209 Hemorrhage in early pregnancy, unspecified: Secondary | ICD-10-CM

## 2014-08-15 HISTORY — DX: Unspecified asthma, uncomplicated: J45.909

## 2014-08-15 HISTORY — DX: Headache, unspecified: R51.9

## 2014-08-15 HISTORY — DX: Headache: R51

## 2014-08-15 LAB — CBC
HEMATOCRIT: 39.5 % (ref 36.0–46.0)
Hemoglobin: 13.7 g/dL (ref 12.0–15.0)
MCH: 26.6 pg (ref 26.0–34.0)
MCHC: 34.7 g/dL (ref 30.0–36.0)
MCV: 76.7 fL — AB (ref 78.0–100.0)
Platelets: 287 10*3/uL (ref 150–400)
RBC: 5.15 MIL/uL — ABNORMAL HIGH (ref 3.87–5.11)
RDW: 14.4 % (ref 11.5–15.5)
WBC: 10 10*3/uL (ref 4.0–10.5)

## 2014-08-15 LAB — URINALYSIS, ROUTINE W REFLEX MICROSCOPIC
Bilirubin Urine: NEGATIVE
GLUCOSE, UA: NEGATIVE mg/dL
Ketones, ur: NEGATIVE mg/dL
NITRITE: NEGATIVE
PH: 6.5 (ref 5.0–8.0)
Protein, ur: NEGATIVE mg/dL
Specific Gravity, Urine: <1.005 — ABNORMAL LOW (ref 1.005–1.030)
Urobilinogen, UA: 0.2 mg/dL (ref 0.0–1.0)

## 2014-08-15 LAB — WET PREP, GENITAL
CLUE CELLS WET PREP: NONE SEEN
Trich, Wet Prep: NONE SEEN
Yeast Wet Prep HPF POC: NONE SEEN

## 2014-08-15 LAB — URINE MICROSCOPIC-ADD ON

## 2014-08-15 LAB — HIV ANTIBODY (ROUTINE TESTING W REFLEX): HIV 1&2 Ab, 4th Generation: NONREACTIVE

## 2014-08-15 LAB — POCT PREGNANCY, URINE: Preg Test, Ur: POSITIVE — AB

## 2014-08-15 LAB — HCG, QUANTITATIVE, PREGNANCY: HCG, BETA CHAIN, QUANT, S: 125952 m[IU]/mL — AB (ref <5)

## 2014-08-15 NOTE — Discharge Instructions (Signed)

## 2014-08-15 NOTE — MAU Note (Signed)
Sees blood when wiping post voiding.

## 2014-08-15 NOTE — MAU Provider Note (Signed)
Chief Complaint: Abdominal Pain   None    SUBJECTIVE HPI: Danielle Hoover is a 33 y.o. [id number] at Unknown by LMP who presents to maternity admissions reporting abdominal cramping off and on x 2-3 days.  Today she noticed light pink spotting when wiping.  Most of her pain is low in the front of her abdomen but she also has umbilical pain and burning pain in her chest which are both intermittent.  She denies vaginal bleeding, vaginal itching/burning, urinary symptoms, h/a, dizziness, n/v, or fever/chills.    Past Medical History  Diagnosis Date  . Anemia     during pregnancy 2006  . Headache   . Asthma    Past Surgical History  Procedure Laterality Date  . No past surgeries     History   Social History  . Marital Status: Legally Separated    Spouse Name: N/A    Number of Children: N/A  . Years of Education: N/A   Occupational History  . Not on file.   Social History Main Topics  . Smoking status: Never Smoker   . Smokeless tobacco: Never Used  . Alcohol Use: No  . Drug Use: No  . Sexual Activity: Yes    Birth Control/ Protection: None     Comment: last week   Other Topics Concern  . Not on file   Social History Narrative  . No narrative on file   No current facility-administered medications on file prior to encounter.   No current outpatient prescriptions on file prior to encounter.   No Known Allergies  ROS: Pertinent items in HPI  OBJECTIVE Blood pressure 119/69, pulse 80, temperature 98.5 F (36.9 C), temperature source Oral, resp. rate 16, height 4\' 10"  (1.473 m), weight 46.777 kg (103 lb 2 oz), last menstrual period 06/21/2014, not currently breastfeeding. GENERAL: Well-developed, well-nourished female in no acute distress.  HEENT: Normocephalic HEART: normal rate RESP: normal effort ABDOMEN: Soft, non-tender EXTREMITIES: Nontender, no edema NEURO: Alert and oriented Pelvic exam: Cervix pink, visually closed, without lesion, small amount thin clear discharge,  vaginal walls and external genitalia normal Bimanual exam: Cervix 0/long/high, firm, anterior, positive CMT, uterus mildly tender, slightly enlarged, adnexa without tenderness, enlargement, or mass  LAB RESULTS Results for orders placed during the hospital encounter of 08/15/14 (from the past 24 hour(s))  URINALYSIS, ROUTINE W REFLEX MICROSCOPIC     Status: Abnormal   Collection Time    08/15/14  3:30 PM      Result Value Ref Range   Color, Urine STRAW (*) YELLOW   APPearance CLEAR  CLEAR   Specific Gravity, Urine <1.005 (*) 1.005 - 1.030   pH 6.5  5.0 - 8.0   Glucose, UA NEGATIVE  NEGATIVE mg/dL   Hgb urine dipstick LARGE (*) NEGATIVE   Bilirubin Urine NEGATIVE  NEGATIVE   Ketones, ur NEGATIVE  NEGATIVE mg/dL   Protein, ur NEGATIVE  NEGATIVE mg/dL   Urobilinogen, UA 0.2  0.0 - 1.0 mg/dL   Nitrite NEGATIVE  NEGATIVE   Leukocytes, UA TRACE (*) NEGATIVE  URINE MICROSCOPIC-ADD ON     Status: Abnormal   Collection Time    08/15/14  3:30 PM      Result Value Ref Range   Squamous Epithelial / LPF FEW (*) RARE   WBC, UA 0-2  <3 WBC/hpf   RBC / HPF 0-2  <3 RBC/hpf  POCT PREGNANCY, URINE     Status: Abnormal   Collection Time    08/15/14  3:40 PM  Result Value Ref Range   Preg Test, Ur POSITIVE (*) NEGATIVE  HCG, QUANTITATIVE, PREGNANCY     Status: Abnormal   Collection Time    08/15/14  4:10 PM      Result Value Ref Range   hCG, Beta Francene Finders [id number] (*) <5 mIU/mL  CBC     Status: Abnormal   Collection Time    08/15/14  4:10 PM      Result Value Ref Range   WBC 10.0  4.0 - 10.5 K/uL   RBC 5.15 (*) 3.87 - 5.11 MIL/uL   Hemoglobin 13.7  12.0 - 15.0 g/dL   HCT 78.2  95.6 - 21.3 %   MCV 76.7 (*) 78.0 - 100.0 fL   MCH 26.6  26.0 - 34.0 pg   MCHC 34.7  30.0 - 36.0 g/dL   RDW 08.6  57.8 - 46.9 %   Platelets 287  150 - 400 K/uL  WET PREP, GENITAL     Status: Abnormal   Collection Time    08/15/14  4:16 PM      Result Value Ref Range   Yeast Wet Prep HPF POC NONE SEEN   NONE SEEN   Trich, Wet Prep NONE SEEN  NONE SEEN   Clue Cells Wet Prep HPF POC NONE SEEN  NONE SEEN   WBC, Wet Prep HPF POC FEW (*) NONE SEEN    IMAGING US Ob Comp Less 14 Wks  08/15/2014   CLINICAL DATA:  Heavy bleeding.  Early pregnancy.  Abdominal pain.  EXAM: OBSTETRIC <14 WK ULTRASOUND  TECHNIQUE: Transabdominal ultrasound was performed for evaluation of the gestation as well as the maternal uterus and adnexal regions.  COMPARISON:  None.  FINDINGS: Intrauterine gestational sac: Present  Yolk sac:  Present  Embryo:  Present  Cardiac Activity: Present  Heart Rate: 162 bpm  MSD:   mm    w     d  CRL:   1.0 cm     7 w 1 d                  [place] EDC: 04/02/2015  Maternal uterus/adnexae: Moderate size subchorionic hemorrhage, 2.9 by 0.7 by 2.1 cm. Corpus luteum cyst noted in the right ovary. No free pelvic fluid.  IMPRESSION: 1. Single living intrauterine pregnancy measuring at 7 weeks 1 day gestation. Moderate size subchorionic hemorrhage.   Electronically Signed   By: Herbie Baltimore M.D.   On: 08/15/2014 17:29   US Ob Transvaginal  08/15/2014   CLINICAL DATA:  Heavy bleeding.  Early pregnancy.  Abdominal pain.  EXAM: OBSTETRIC <14 WK ULTRASOUND  TECHNIQUE: Transabdominal ultrasound was performed for evaluation of the gestation as well as the maternal uterus and adnexal regions.  COMPARISON:  None.  FINDINGS: Intrauterine gestational sac: Present  Yolk sac:  Present  Embryo:  Present  Cardiac Activity: Present  Heart Rate: 162 bpm  MSD:   mm    w     d  CRL:   1.0 cm     7 w 1 d                  [place] EDC: 04/02/2015  Maternal uterus/adnexae: Moderate size subchorionic hemorrhage, 2.9 by 0.7 by 2.1 cm. Corpus luteum cyst noted in the right ovary. No free pelvic fluid.  IMPRESSION: 1. Single living intrauterine pregnancy measuring at 7 weeks 1 day gestation. Moderate size subchorionic hemorrhage.   Electronically Signed   By: Herbie Baltimore  M.D.   On: 08/15/2014 17:29    ASSESSMENT 1. Vaginal bleeding  in pregnancy, first trimester   2. Subchorionic hemorrhage in first trimester     PLAN Discharge home with bleeding precautions Begin care with prenatal provider of your choice Zantac BID PRN for heartburn   Follow-up Information   [facility]. (As needed for emergencies)    Contact information:   [address] [id number] [place] [id number] [phone]      Sharen CounterLisa Leftwich-Kirby Certified Nurse-Midwife 08/15/2014  5:54 PM

## 2014-08-15 NOTE — MAU Note (Signed)
Pt states here for lower abd pain and bleeding that began today. Denies abnormal vaginal discharge. Has suprapubic pain every time she voids.

## 2014-08-15 NOTE — MAU Provider Note (Signed)
Attestation of Attending Supervision of Advanced Practitioner (PA/CNM/NP): Evaluation and management procedures were performed by the Advanced Practitioner under my supervision and collaboration.  I have reviewed the Advanced Practitioner's note and chart, and I agree with the management and plan.  Amiri Riechers S, MD [facility] Attending 08/15/2014 7:47 PM   

## 2014-08-16 ENCOUNTER — Encounter (HOSPITAL_COMMUNITY): Payer: Self-pay | Admitting: *Deleted

## 2014-08-16 LAB — URINE CULTURE
CULTURE: NO GROWTH
Colony Count: NO GROWTH

## 2014-08-16 LAB — GC/CHLAMYDIA PROBE AMP
CT Probe RNA: POSITIVE — AB
GC PROBE AMP APTIMA: NEGATIVE

## 2014-08-17 ENCOUNTER — Telehealth: Payer: Self-pay | Admitting: General Practice

## 2014-08-17 DIAGNOSIS — A749 Chlamydial infection, unspecified: Secondary | ICD-10-CM

## 2014-08-17 DIAGNOSIS — O98811 Other maternal infectious and parasitic diseases complicating pregnancy, first trimester: Principal | ICD-10-CM

## 2014-08-17 MED ORDER — AZITHROMYCIN 250 MG PO TABS: 1000.0000 mg | ORAL_TABLET | Freq: Once | ORAL | Status: DC

## 2014-08-17 NOTE — Telephone Encounter (Signed)
Med ordered. Called patient and informed her of medication being sent to pharmacy. Patient verbalized understanding and had no other questions

## 2014-08-17 NOTE — Telephone Encounter (Signed)
Message copied by Kathee DeltonHILLMAN, CARRIE L on Wed Aug 17, 2014  9:56 AM ------      Message from: ArcadiaSMITH, MARNI W      Created: Wed Aug 17, 2014  9:26 AM       Patient returned call, notified her of positive chlamydia.  Patient did speak [place].  Patient has not been treated and will [facility]  Instructed patient to notify her partner for treatment.  Instructed patient to abstain from sex for seven days after both have completed their treatment. ------

## 2014-09-05 ENCOUNTER — Encounter (HOSPITAL_COMMUNITY): Payer: Self-pay | Admitting: *Deleted

## 2014-10-03 LAB — OB RESULTS CONSOLE RUBELLA ANTIBODY, IGM: Rubella: IMMUNE

## 2014-10-03 LAB — OB RESULTS CONSOLE ABO/RH: RH TYPE: POSITIVE

## 2014-10-03 LAB — OB RESULTS CONSOLE GC/CHLAMYDIA
Chlamydia: POSITIVE
GC PROBE AMP, GENITAL: NEGATIVE

## 2014-10-03 LAB — CULTURE, OB URINE: Urine Culture, OB: NO GROWTH

## 2014-10-03 LAB — OB RESULTS CONSOLE VARICELLA ZOSTER ANTIBODY, IGG: Varicella: IMMUNE

## 2014-10-03 LAB — OB RESULTS CONSOLE ANTIBODY SCREEN: Antibody Screen: NEGATIVE

## 2014-10-03 LAB — GLUCOSE TOLERANCE, 1 HOUR (50G) W/O FASTING: GLUCOSE 1 HOUR GTT: 81

## 2014-10-03 LAB — OB RESULTS CONSOLE HGB/HCT, BLOOD
HEMATOCRIT: 38 %
Hemoglobin: 12.5 g/dL

## 2014-10-03 LAB — OB RESULTS CONSOLE HIV ANTIBODY (ROUTINE TESTING): HIV: NONREACTIVE

## 2014-10-03 LAB — CYTOLOGY - PAP: CYTOLOGY - PAP: NEGATIVE

## 2014-10-03 LAB — DRUG SCREEN, URINE
DRUG SCREEN, URINE: NEGATIVE
DRUG SCREEN, URINE: NEGATIVE

## 2014-10-03 LAB — OB RESULTS CONSOLE HEPATITIS B SURFACE ANTIGEN: Hepatitis B Surface Ag: NEGATIVE

## 2014-10-03 LAB — CYSTIC FIBROSIS DIAGNOSTIC STUDY: Interpretation-CFDNA:: NEGATIVE

## 2014-10-03 LAB — SICKLE CELL SCREEN: Sickle Cell Screen: NORMAL

## 2014-10-03 LAB — OB RESULTS CONSOLE PLATELET COUNT: PLATELETS: 221 10*3/uL

## 2014-10-10 DIAGNOSIS — A749 Chlamydial infection, unspecified: Secondary | ICD-10-CM

## 2014-10-10 DIAGNOSIS — O09292 Supervision of pregnancy with other poor reproductive or obstetric history, second trimester: Secondary | ICD-10-CM

## 2014-10-10 DIAGNOSIS — Z603 Acculturation difficulty: Secondary | ICD-10-CM

## 2014-10-10 DIAGNOSIS — O09299 Supervision of pregnancy with other poor reproductive or obstetric history, unspecified trimester: Secondary | ICD-10-CM | POA: Insufficient documentation

## 2014-10-10 DIAGNOSIS — O09899 Supervision of other high risk pregnancies, unspecified trimester: Secondary | ICD-10-CM | POA: Insufficient documentation

## 2014-10-10 DIAGNOSIS — Z758 Other problems related to medical facilities and other health care: Secondary | ICD-10-CM | POA: Insufficient documentation

## 2014-10-10 DIAGNOSIS — O09892 Supervision of other high risk pregnancies, second trimester: Secondary | ICD-10-CM

## 2014-10-10 DIAGNOSIS — Z789 Other specified health status: Secondary | ICD-10-CM

## 2014-10-13 ENCOUNTER — Ambulatory Visit (INDEPENDENT_AMBULATORY_CARE_PROVIDER_SITE_OTHER): Payer: BC Managed Care – PPO | Admitting: Family Medicine

## 2014-10-13 ENCOUNTER — Encounter: Payer: Self-pay | Admitting: Family Medicine

## 2014-10-13 VITALS — BP 111/65 | HR 97 | Temp 98.0°F | Wt 101.7 lb

## 2014-10-13 DIAGNOSIS — R319 Hematuria, unspecified: Secondary | ICD-10-CM

## 2014-10-13 DIAGNOSIS — O09299 Supervision of pregnancy with other poor reproductive or obstetric history, unspecified trimester: Secondary | ICD-10-CM | POA: Insufficient documentation

## 2014-10-13 DIAGNOSIS — O0991 Supervision of high risk pregnancy, unspecified, first trimester: Secondary | ICD-10-CM

## 2014-10-13 DIAGNOSIS — O09291 Supervision of pregnancy with other poor reproductive or obstetric history, first trimester: Secondary | ICD-10-CM

## 2014-10-13 DIAGNOSIS — O09292 Supervision of pregnancy with other poor reproductive or obstetric history, second trimester: Secondary | ICD-10-CM

## 2014-10-13 DIAGNOSIS — O0990 Supervision of high risk pregnancy, unspecified, unspecified trimester: Secondary | ICD-10-CM | POA: Insufficient documentation

## 2014-10-13 LAB — POCT URINALYSIS DIP (DEVICE)
BILIRUBIN URINE: NEGATIVE
Glucose, UA: NEGATIVE mg/dL
Ketones, ur: NEGATIVE mg/dL
LEUKOCYTES UA: NEGATIVE
Nitrite: NEGATIVE
PH: 7 (ref 5.0–8.0)
Protein, ur: 30 mg/dL — AB
Specific Gravity, Urine: 1.025 (ref 1.005–1.030)
Urobilinogen, UA: 1 mg/dL (ref 0.0–1.0)

## 2014-10-13 MED ORDER — ASPIRIN 81 MG PO CHEW: 81.0000 mg | CHEWABLE_TABLET | Freq: Every day | ORAL | Status: DC

## 2014-10-13 NOTE — Progress Notes (Signed)
Anatomy U/S with [facility] 10/27/14 @ 11a.  [facility]  Estanislado SpireJarai interpreter Joaquin BendLek Siu present.

## 2014-10-13 NOTE — Progress Notes (Signed)
[facility] for h/o 32 wk IUFD with pre-eclampsia Quad today Schedule u/s for anatomy Will need serial u/s for growth and 2x/wk testing at 32 wks. Urine culture for hematuria today

## 2014-10-13 NOTE — Patient Instructions (Signed)
Second Trimester of Pregnancy The second trimester is from week 13 through week 28, months 4 through 6. The second trimester is often a time when you feel your best. Your body has also adjusted to being pregnant, and you begin to feel better physically. Usually, morning sickness has lessened or quit completely, you may have more energy, and you may have an increase in appetite. The second trimester is also a time when the fetus is growing rapidly. At the end of the sixth month, the fetus is about 9 inches long and weighs about 1 pounds. You will likely begin to feel the baby move (quickening) between 18 and 20 weeks of the pregnancy. BODY CHANGES Your body goes through many changes during pregnancy. The changes vary from woman to woman.   Your weight will continue to increase. You will notice your lower abdomen bulging out.  You may begin to get stretch marks on your hips, abdomen, and breasts.  You may develop headaches that can be relieved by medicines approved by your health care provider.  You may urinate more often because the fetus is pressing on your bladder.  You may develop or continue to have heartburn as a result of your pregnancy.  You may develop constipation because certain hormones are causing the muscles that push waste through your intestines to slow down.  You may develop hemorrhoids or swollen, bulging veins (varicose veins).  You may have back pain because of the weight gain and pregnancy hormones relaxing your joints between the bones in your pelvis and as a result of a shift in weight and the muscles that support your balance.  Your breasts will continue to grow and be tender.  Your gums may bleed and may be sensitive to brushing and flossing.  Dark spots or blotches (chloasma, mask of pregnancy) may develop on your face. This will likely fade after the baby is born.  A dark line from your belly button to the pubic area (linea nigra) may appear. This will likely fade  after the baby is born.  You may have changes in your hair. These can include thickening of your hair, rapid growth, and changes in texture. Some women also have hair loss during or after pregnancy, or hair that feels dry or thin. Your hair will most likely return to normal after your baby is born. WHAT TO EXPECT AT YOUR PRENATAL VISITS During a routine prenatal visit:  You will be weighed to make sure you and the fetus are growing normally.  Your blood pressure will be taken.  Your abdomen will be measured to track your baby's growth.  The fetal heartbeat will be listened to.  Any test results from the previous visit will be discussed. Your health care provider may ask you:  How you are feeling.  If you are feeling the baby move.  If you have had any abnormal symptoms, such as leaking fluid, bleeding, severe headaches, or abdominal cramping.  If you have any questions. Other tests that may be performed during your second trimester include:  Blood tests that check for:  Low iron levels (anemia).  Gestational diabetes (between 24 and 28 weeks).  Rh antibodies.  Urine tests to check for infections, diabetes, or protein in the urine.  An ultrasound to confirm the proper growth and development of the baby.  An amniocentesis to check for possible genetic problems.  Fetal screens for spina bifida and Down syndrome. HOME CARE INSTRUCTIONS   Avoid all smoking, herbs, alcohol, and unprescribed   drugs. These chemicals affect the formation and growth of the baby.  Follow your health care provider's instructions regarding medicine use. There are medicines that are either safe or unsafe to take during pregnancy.  Exercise only as directed by your health care provider. Experiencing uterine cramps is a good sign to stop exercising.  Continue to eat regular, healthy meals.  Wear a good support bra for breast tenderness.  Do not use hot tubs, steam rooms, or saunas.  Wear your  seat belt at all times when driving.  Avoid raw meat, uncooked cheese, cat litter boxes, and soil used by cats. These carry germs that can cause birth defects in the baby.  Take your prenatal vitamins.  Try taking a stool softener (if your health care provider approves) if you develop constipation. Eat more high-fiber foods, such as fresh vegetables or fruit and whole grains. Drink plenty of fluids to keep your urine clear or pale yellow.  Take warm sitz baths to soothe any pain or discomfort caused by hemorrhoids. Use hemorrhoid cream if your health care provider approves.  If you develop varicose veins, wear support hose. Elevate your feet for 15 minutes, 3-4 times a day. Limit salt in your diet.  Avoid heavy lifting, wear low heel shoes, and practice good posture.  Rest with your legs elevated if you have leg cramps or low back pain.  Visit your dentist if you have not gone yet during your pregnancy. Use a soft toothbrush to brush your teeth and be gentle when you floss.  A sexual relationship may be continued unless your health care provider directs you otherwise.  Continue to go to all your prenatal visits as directed by your health care provider. SEEK MEDICAL CARE IF:   You have dizziness.  You have mild pelvic cramps, pelvic pressure, or nagging pain in the abdominal area.  You have persistent nausea, vomiting, or diarrhea.  You have a bad smelling vaginal discharge.  You have pain with urination. SEEK IMMEDIATE MEDICAL CARE IF:   You have a fever.  You are leaking fluid from your vagina.  You have spotting or bleeding from your vagina.  You have severe abdominal cramping or pain.  You have rapid weight gain or loss.  You have shortness of breath with chest pain.  You notice sudden or extreme swelling of your face, hands, ankles, feet, or legs.  You have not felt your baby move in over an hour.  You have severe headaches that do not go away with  medicine.  You have vision changes. Document Released: 10/15/2001 Document Revised: 10/26/2013 Document Reviewed: 12/22/2012 ExitCare Patient Information 2015 [employer]. This information is not intended to replace advice given to you by your health care provider. Make sure you discuss any questions you have with your health care provider.  

## 2014-10-13 NOTE — Progress Notes (Signed)
C/o of occasional pelvic pain.  [facility] visit. [facility] Records in hand-- have already been abstracted but need to be scanned in.  Up to date on lab work.  Joaquin BendLek Siu used as interpreter for this encounter.

## 2014-10-15 LAB — CULTURE, OB URINE
Colony Count: NO GROWTH
Organism ID, Bacteria: NO GROWTH

## 2014-10-18 ENCOUNTER — Encounter: Payer: Self-pay | Admitting: *Deleted

## 2014-10-18 LAB — AFP, QUAD SCREEN
AFP: 66 ng/mL
Age Alone: 1:534 {titer}
Curr Gest Age: 15.4 wks.days
Down Syndrome Scr Risk Est: 1:23500 {titer}
HCG, Total: 80.28 IU/mL
INH: 260.1 pg/mL
INTERPRETATION-AFP: NEGATIVE
MOM FOR HCG: 1.24
MoM for AFP: 1.44
MoM for INH: 1.14
Open Spina bifida: NEGATIVE
TRI 18 SCR RISK EST: NEGATIVE
Trisomy 18 (Edward) Syndrome Interp.: 1:73100 {titer}
UE3 MOM: 1.05
UE3 VALUE: 1.09 ng/mL

## 2014-10-27 ENCOUNTER — Ambulatory Visit (HOSPITAL_COMMUNITY)
Admission: RE | Admit: 2014-10-27 | Discharge: 2014-10-27 | Disposition: A | Discharge status: Home / Self Care | Payer: BC Managed Care – PPO | Source: Ambulatory Visit | Attending: Family Medicine | Admitting: Family Medicine

## 2014-10-27 ENCOUNTER — Encounter (HOSPITAL_COMMUNITY): Payer: Self-pay

## 2014-10-27 DIAGNOSIS — O0991 Supervision of high risk pregnancy, unspecified, first trimester: Secondary | ICD-10-CM | POA: Insufficient documentation

## 2014-10-27 NOTE — ED Notes (Signed)
Language interpreter Fernanda DrumH Wier Siu with pt.

## 2014-10-30 DIAGNOSIS — O09299 Supervision of pregnancy with other poor reproductive or obstetric history, unspecified trimester: Secondary | ICD-10-CM | POA: Insufficient documentation

## 2014-10-30 DIAGNOSIS — Z3A18 18 weeks gestation of pregnancy: Secondary | ICD-10-CM | POA: Insufficient documentation

## 2014-10-30 DIAGNOSIS — Z3689 Encounter for other specified antenatal screening: Secondary | ICD-10-CM | POA: Insufficient documentation

## 2014-11-04 DIAGNOSIS — Z8619 Personal history of other infectious and parasitic diseases: Secondary | ICD-10-CM

## 2014-11-04 HISTORY — DX: Personal history of other infectious and parasitic diseases: Z86.19

## 2014-11-04 NOTE — L&D Delivery Note (Signed)
Patient is 34 y.o. [id number] 4363w2d admitted in active labor, hx of IUFD at 32w, hx of chlamydia x 2 current pregnancy   Delivery Note At 11:54 AM a viable female was delivered via Vaginal, Spontaneous Delivery (Presentation: ; Occiput Anterior).  APGAR: 8, 9; weight pending Placenta status: Intact, Spontaneous.  Cord: 3 vessels with the following complications: None.    Mother was afebrile, however at time of delivery was tachycardic with fetal tachycardia to 180-190s.  No abx or diagnosis of chorioamnionitis, no foul smelling amniotic fluid at time of delivery.  Anesthesia: Epidural  Episiotomy: None Lacerations: Labial Suture Repair: 4.0 monocryl Est. Blood Loss (mL):  258mL   Mom to postpartum.  Baby to Couplet care / Skin to Skin.  Danielle Hoover ROCIO 03/16/2015, 12:18 PM

## 2014-11-10 ENCOUNTER — Ambulatory Visit (INDEPENDENT_AMBULATORY_CARE_PROVIDER_SITE_OTHER): Payer: Medicaid Other | Admitting: Family

## 2014-11-10 VITALS — BP 109/65 | HR 97 | Temp 98.4°F | Wt 102.0 lb

## 2014-11-10 DIAGNOSIS — O0991 Supervision of high risk pregnancy, unspecified, first trimester: Secondary | ICD-10-CM

## 2014-11-10 LAB — POCT URINALYSIS DIP (DEVICE)
Glucose, UA: NEGATIVE mg/dL
KETONES UR: NEGATIVE mg/dL
Leukocytes, UA: NEGATIVE
Nitrite: NEGATIVE
PH: 6.5 (ref 5.0–8.0)
PROTEIN: 30 mg/dL — AB
Specific Gravity, Urine: 1.02 (ref 1.005–1.030)
Urobilinogen, UA: 2 mg/dL — ABNORMAL HIGH (ref 0.0–1.0)

## 2014-11-10 NOTE — Progress Notes (Signed)
Pt reports doing well.  Reviewed anatomy ultrasound result and labs.  Taking ASA as directed.  Plan to do chlamydia test of cure at next visit.

## 2014-11-10 NOTE — Progress Notes (Signed)
No [place] interpreter available per [employer]. Pacific interpreter Gravity(vietnamese) [phone]#[id number].

## 2014-12-01 ENCOUNTER — Ambulatory Visit (INDEPENDENT_AMBULATORY_CARE_PROVIDER_SITE_OTHER): Payer: Medicaid Other | Admitting: Obstetrics & Gynecology

## 2014-12-01 VITALS — BP 104/55 | HR 93 | Temp 97.8°F | Wt 106.1 lb

## 2014-12-01 DIAGNOSIS — Z3492 Encounter for supervision of normal pregnancy, unspecified, second trimester: Secondary | ICD-10-CM

## 2014-12-01 DIAGNOSIS — Z3A23 23 weeks gestation of pregnancy: Secondary | ICD-10-CM

## 2014-12-01 DIAGNOSIS — A749 Chlamydial infection, unspecified: Secondary | ICD-10-CM

## 2014-12-01 DIAGNOSIS — O98812 Other maternal infectious and parasitic diseases complicating pregnancy, second trimester: Secondary | ICD-10-CM

## 2014-12-01 LAB — POCT URINALYSIS DIP (DEVICE)
Glucose, UA: NEGATIVE mg/dL
Ketones, ur: NEGATIVE mg/dL
Leukocytes, UA: NEGATIVE
NITRITE: NEGATIVE
PH: 6.5 (ref 5.0–8.0)
Protein, ur: 30 mg/dL — AB
Specific Gravity, Urine: 1.02 (ref 1.005–1.030)
Urobilinogen, UA: 4 mg/dL — ABNORMAL HIGH (ref 0.0–1.0)

## 2014-12-01 NOTE — Progress Notes (Signed)
Sciatic pain on left side for 4 weeks. Instructions for comfort  Measures given. Robitussin o k cough

## 2014-12-01 NOTE — Progress Notes (Signed)
Patient reports pain starting at left hip and down to feet

## 2014-12-01 NOTE — Patient Instructions (Signed)
?au Th?n Kinh T?a (Sciatica) ?au th?n kinh t?a l ?au, y?u, t ho?c ?au bu?t d?c theo ???ng ?i c?a dy th?n kinh hng. Dy th?n kinh b?t ??u ? th?t l?ng v ch?y xu?ng pha m?t sau m?i chn. Dy th?n kinh ny ?i?u khi?n cc c? ? c?ng chn v ? m?t sau c?a ??u g?i, trong khi c?ng cho c?m gic ? m?t sau c?a ?i, c?ng chn v lng bn chn. ?au th?n kinh t?a l tri?u ch?ng c?a m?t ch?ng b?nh khc. V d?, t?n th??ng dy th?n kinh ho?c m?t s? tnh tr?ng c? th?, ch?ng h?n nh? thot v? ??a ??m ho?c m? x??ng trn c?t s?ng, k?p ho?c t ? ln dy th?n kinh hng. ?i?u ny gy ?au, y?u ho?c nh?ng c?m gic khc, th??ng k?t h?p v?i ?au th?n kinh t?a. Ni chung, ?au th?n kinh t?a ch? ?nh h??ng ??n m?t bn c?a c? th?.  NGUYN NHNRadicular Pain Radicular pain in either the arm or leg is usually from a bulging or herniated disk in the spine. A piece of the herniated disk may press against the nerves as the nerves exit the spine. This causes pain which is felt at the tips of the nerves down the arm or leg. Other causes of radicular pain may include:  Fractures.  Heart disease.  Cancer.  An abnormal and usually degenerative state of the nervous system or nerves (neuropathy). Diagnosis may require CT or MRI scanning to determine the primary cause.  Nerves that start at the neck (nerve roots) may cause radicular pain in the outer shoulder and arm. It can spread down to the thumb and fingers. The symptoms vary depending on which nerve root has been affected. In most cases radicular pain improves with conservative treatment. Neck problems may require physical therapy, a neck collar, or cervical traction. Treatment may take many weeks, and surgery may be considered if the symptoms do not improve.  Conservative treatment is also recommended for sciatica. Sciatica causes pain to radiate from the lower back or buttock area down the leg into the foot. Often there is a history of back problems. Most patients with sciatica are  better after 2 to 4 weeks of rest and other supportive care. Short term bed rest can reduce the disk pressure considerably. Sitting, however, is not a good position since this increases the pressure on the disk. You should avoid bending, lifting, and all other activities which make the problem worse. Traction can be used in severe cases. Surgery is usually reserved for patients who do not improve within the first months of treatment. Only take over-the-counter or prescription medicines for pain, discomfort, or fever as directed by your caregiver. Narcotics and muscle relaxants may help by relieving more severe pain and spasm and by providing mild sedation. Cold or massage can give significant relief. Spinal manipulation is not recommended. It can increase the degree of disc protrusion. Epidural steroid injections are often effective treatment for radicular pain. These injections deliver medicine to the spinal nerve in the space between the protective covering of the spinal cord and back bones (vertebrae). Your caregiver can give you more information about steroid injections. These injections are most effective when given within two weeks of the onset of pain.  You should see your caregiver for follow up care as recommended. A program for neck and back injury rehabilitation with stretching and strengthening exercises is an important part of management.  SEEK IMMEDIATE MEDICAL CARE IF:  You develop increin.[employer]Care, [employer]. This information  is not intended to replace advice given to you by your health care provider. Make sure you discuss any questions you have with your heaased pain, weakness, or numbness in your arm or leg.  You develop difficulty with bladder or bowel control.  You develop abdominal palth care prov Document Released: 11/28/2004 Document Revised: 01/13/2012 Document Reviewed: 02/13/2009 ExitCare Patient Information 2015 [employer].   Thot v? ho?c tr??t ??a ??m.  B?nh thoi ha ??a  ??m.  R?i lo?n ?au lin quan ??n c? h?p ? mng (h?i ch?ng piriformis).  Ch?n th??ng ho?c gy x??ng ch?u.  [place]ang [place].  Kh?i u (hi?m). TRI?U CH?NG Cc tri?u ch?ng c th? khc nhau t? nh? ??n r?t n?ng. Cc tri?u ch?ng th??ng ?i t? th?t l?ng xu?ng mng v xu?ng m?t sau c?a chn. Cc tri?u ch?ng c th? bao g?m:  ?au bu?t nh? ho?c ?au m ? ? th?t l?ng, chn ho?c hng.  T ? m?t sau c?a b?p chn ho?c gan bn chn.  C?m gic nng rt ? th?t l?ng, chn ho?c hng.  ?au nhi ? th?t l?ng, chn ho?c hng.  Y?u chn.  ?au l?ng nghim tr?ng gy kh kh?n cho vi?c ?i l?i. Cc tri?u ch?ng ny c th? t?i t? h?n khi c ho, h?t h?i, c??i ho?c ng?i ho?c ??ng lu. Ngoi ra, bo ph c th? lm tr?m tr?ng thm cc tri?u ch?ng. CH?N ?ON Chuyn gia ch?m [place] s?c kh?e s? khm th?c th? ?? tm cc tri?u ch?ng ph? bi?n c?a ?au th?n kinh t?a. Chuyn gia ch?m [place] s?c kh?e c th? yu c?u b?n th?c hi?n nh?ng ??ng tc ho?c ho?t ??ng nh?t ??nh gy ?au dy th?n kinh hng. Cc ki?m tra khc c th? ???c th?c hi?n ?? tm ra nguyn nhn ?au th?n kinh t?a. Cc bi t?p ny c th? bao g?m:  Xt nghi?m mu.  Ch?p X quang.  Ki?m tra hnh ?nh, ch?ng h?n nh? MRI ho?c ch?p CT. ?I?U TR? ?i?u tr? nh?m vo nguyn nhn gy ?au th?n kinh t?a. ?i khi, khng c?n ?i?u tr?, c?n ?au v c?m gic kh ch?u s? t? h?t. N?u c?n ?i?u tr?, chuyn gia ch?m [place] s?c kh?e c th? ?? ngh?:  Thu?c gi?m ?au khng c?n k toa.  Thu?c k toa, ch?ng h?n nh? thu?c khng vim, gin c? ho?c thu?c gi?m ?au gy ng?.  Ch??m nng ho?c ? l?nh vo ch? ?au.  Tim steroid ?? gi?m ?au, gi?m kch thch v gi?m vim quanh dy th?n kinh.  Gi?m ho?t ??ng trong th?i gian ?au.  T?p th? d?c v ko gin ?? t?ng s?c b?n ? b?ng v c?i thi?n ?? m?m d?o c?a c?t s?ng. Chuyn gia ch?m [place] s?c kh?e c th? ?? ngh? gi?m cn n?u th?a cn khi?n ?au l?ng t?i t? h?n.  V?t l tr? li?u.  Ph?u thu?t ?? lo?i b? nh?ng g ?ang gy s?c p ho?c k?p dy th?n kinh, ch?ng h?n nh? m?  x??ng ho?c m?t ph?n c?a thot v? ??a ??m. H??NG D?N CH?M [place] T?I NH  Ch? s? d?ng thu?c khng c?n k toa ho?c thu?c c?n k toa ?? gi?m ?au ho?c gi?m c?m gic kh ch?u theo ch? d?n c?a chuyn gia ch?m [place] s?c kh?e c?a b?n.  Ch??m ? vo khu v?c b? ?nh h??ng trong 20 pht, 3-4 l?n m?i ngy trong 48-72 gi? ??u tin. Sau ? th? ch??m nng theo cng cch.  T?p th? d?c, ko gin ho?c th?c hi?n cc ho?t ??ng thng th??ng  c?a b?n n?u nh?ng ho?t ??ng ny khng lm tr?m tr?ng thm c?n ?au c?a b?n.  Tham d? cc bu?i v?t l tr? li?u theo ch? d?n c?a chuyn gia ch?m [place] s?c kh?e.  Tun th? m?i cu?c h?n khm l?i theo ch? d?n c?a chuyn gia ch?m [place] s?c kh?e.  Khng mang giy cao gt ho?c giy khng h? tr? thch h?p.  Ki?m tra n?m c?a b?n xem n c qu m?m khng. N?m c?ng ch?c c th? gi?m ?au v gi?m c?m gic kh ch?u c?a b?n. HY NGAY L?P T?C ?I KHM N?U:  B?n ?i ngoi ho?c ?i ti?u m?t ki?m sot (khng t? ch?).  B?n b? y?u gia t?ng ? th?t l?ng, khung ch?u, mng v chn.  B?n c b? t?y ?? ho?c s?ng ? l?ng.  B?n c c?m gic nng rt khi ?i ti?u.  B?n b? ?au n?ng h?n khi n?m xu?ng ho?c khi t?nh gi?c vo ban ?m.  C?n ?au t?i t? h?n so v?i c? ?au b?n b? trong qu kh?.  C?n ?au ko di h?n 4 tu?n.  B?n ??t nhin gi?m cn m khng c l do. ??M B?O B?N:  Hi?u cc h??ng d?n ny.  S? theo di tnh tr?ng c?a mnh.  S? yu c?u tr? gip ngay l?p t?c n?u b?n c?m th?y khng ?? ho?c tnh tr?ng tr?m tr?ng h?n. Document Released: 04/21/2012 Document Revised: 06/23/2013 [facility] Patient Information 2015 [place] PaulExitCare, MarylandLLC. This information is not intended to replace advice given to you by your health care provider. Make sure you discuss any questions you have with your health care provider.

## 2014-12-02 LAB — GC/CHLAMYDIA PROBE AMP
CT Probe RNA: POSITIVE — AB
GC Probe RNA: NEGATIVE

## 2014-12-05 ENCOUNTER — Telehealth: Payer: Self-pay

## 2014-12-05 DIAGNOSIS — A749 Chlamydial infection, unspecified: Secondary | ICD-10-CM

## 2014-12-05 MED ORDER — AZITHROMYCIN 500 MG PO TABS: 1000.0000 mg | ORAL_TABLET | Freq: Once | ORAL | Status: DC

## 2014-12-05 NOTE — Telephone Encounter (Signed)
Zithromax 1gm e-prescribed to pharmacy per protocol for +chlamydia. Attempted to contact patient (per prior telephone encounter in 08/2014 patient does speak english). No answer. Left message stating we are calling with results, please call clinic. Per chart review, patient tested positive for this back on 08/15/14 as well. Patient needs to be informed of results and medication at pharmacy-- also advised to have partner treated and abstain from intercourse for at least 7 days after treatment to prevent re-infection. [facility]

## 2014-12-05 NOTE — Telephone Encounter (Signed)
-----   Message from Adam PhenixJames G Arnold, MD sent at 12/02/2014 12:48 PM EST ----- Needs treatment per protocol

## 2014-12-07 NOTE — Telephone Encounter (Signed)
Attempted to contact patient. Left message stating we are calling with results, please call clinic. Called EC who states patient is at work-- states he will have patient call when she gets home. Due to being unable to reach patient during business hours, will send letter.

## 2014-12-29 ENCOUNTER — Ambulatory Visit (INDEPENDENT_AMBULATORY_CARE_PROVIDER_SITE_OTHER): Payer: Self-pay | Admitting: Family Medicine

## 2014-12-29 VITALS — BP 101/68 | HR 90 | Temp 97.9°F | Wt 107.3 lb

## 2014-12-29 DIAGNOSIS — O368121 Decreased fetal movements, second trimester, fetus 1: Secondary | ICD-10-CM

## 2014-12-29 DIAGNOSIS — Z23 Encounter for immunization: Secondary | ICD-10-CM

## 2014-12-29 DIAGNOSIS — O0991 Supervision of high risk pregnancy, unspecified, first trimester: Secondary | ICD-10-CM

## 2014-12-29 LAB — POCT URINALYSIS DIP (DEVICE)
Bilirubin Urine: NEGATIVE
Glucose, UA: NEGATIVE mg/dL
KETONES UR: NEGATIVE mg/dL
Leukocytes, UA: NEGATIVE
Nitrite: NEGATIVE
Protein, ur: NEGATIVE mg/dL
SPECIFIC GRAVITY, URINE: 1.015 (ref 1.005–1.030)
Urobilinogen, UA: 0.2 mg/dL (ref 0.0–1.0)
pH: 6.5 (ref 5.0–8.0)

## 2014-12-29 MED ORDER — TETANUS-DIPHTH-ACELL PERTUSSIS 5-2.5-18.5 LF-MCG/0.5 IM SUSP
0.5000 mL | Freq: Once | INTRAMUSCULAR | Status: AC
  Administered 2014-12-29: 0.5 mL via INTRAMUSCULAR

## 2014-12-29 NOTE — Progress Notes (Signed)
Interpreter Lek Siu Pain- ligament pain on R side Pt reports not feeling baby move this am

## 2014-12-29 NOTE — Progress Notes (Signed)
Patient is 34 y.o. [id number].  denies LOF, VB, contractions, vaginal discharge. - reports no fetal movement x 2 days, reports falling down stairs 2-10.  When she fell she hit her bottom and unclear if she hit her belly or not => reinforced fetal kick counts and need to go to [facility] for evaluation for trauma - Left sided pain: nothing improves/worsens, irregular BM => discussed round ligament comfort measures = >NST reactive, 2 contractions noted, encourage PO hydration

## 2014-12-29 NOTE — Progress Notes (Signed)
Dr. Loreta AveAcosta reviewed strip, no new orders.  Pt reports feeling some fetal movement during testing.

## 2014-12-30 LAB — HIV ANTIBODY (ROUTINE TESTING W REFLEX): HIV 1&2 Ab, 4th Generation: NONREACTIVE

## 2014-12-30 LAB — CBC
HEMATOCRIT: 31.5 % — AB (ref 36.0–46.0)
Hemoglobin: 10.6 g/dL — ABNORMAL LOW (ref 12.0–15.0)
MCH: 27.9 pg (ref 26.0–34.0)
MCHC: 33.7 g/dL (ref 30.0–36.0)
MCV: 82.9 fL (ref 78.0–100.0)
MPV: 9.1 fL (ref 8.6–12.4)
Platelets: 198 10*3/uL (ref 150–400)
RBC: 3.8 MIL/uL — ABNORMAL LOW (ref 3.87–5.11)
RDW: 14.4 % (ref 11.5–15.5)
WBC: 8.9 10*3/uL (ref 4.0–10.5)

## 2014-12-30 LAB — GLUCOSE TOLERANCE, 1 HOUR (50G) W/O FASTING: Glucose, 1 Hour GTT: 91 mg/dL (ref 70–140)

## 2014-12-30 LAB — RPR

## 2015-01-03 ENCOUNTER — Encounter: Payer: Self-pay | Admitting: General Practice

## 2015-01-26 ENCOUNTER — Ambulatory Visit (INDEPENDENT_AMBULATORY_CARE_PROVIDER_SITE_OTHER): Payer: Self-pay | Admitting: Obstetrics & Gynecology

## 2015-01-26 VITALS — BP 108/62 | HR 98 | Temp 98.2°F | Wt 111.6 lb

## 2015-01-26 DIAGNOSIS — A749 Chlamydial infection, unspecified: Secondary | ICD-10-CM

## 2015-01-26 DIAGNOSIS — O98813 Other maternal infectious and parasitic diseases complicating pregnancy, third trimester: Secondary | ICD-10-CM

## 2015-01-26 DIAGNOSIS — O0993 Supervision of high risk pregnancy, unspecified, third trimester: Secondary | ICD-10-CM

## 2015-01-26 LAB — POCT URINALYSIS DIP (DEVICE)
BILIRUBIN URINE: NEGATIVE
Glucose, UA: NEGATIVE mg/dL
Ketones, ur: NEGATIVE mg/dL
Leukocytes, UA: NEGATIVE
NITRITE: NEGATIVE
PH: 7 (ref 5.0–8.0)
PROTEIN: NEGATIVE mg/dL
Specific Gravity, Urine: 1.02 (ref 1.005–1.030)
UROBILINOGEN UA: 1 mg/dL (ref 0.0–1.0)

## 2015-01-26 NOTE — Progress Notes (Signed)
Interpreter- [employer]Lus [place] Pain- "belly tightens"  Pt reports having pink-tinged discharge

## 2015-01-26 NOTE — Progress Notes (Signed)
Slight spotting, irregular contractions noted. Scant pink mucus noted, CT andGC tested, precautions given. US  And NST next week

## 2015-01-26 NOTE — Patient Instructions (Signed)
Third Trimester of Pregnancy The third trimester is from week 29 through week 42, months 7 through 9. The third trimester is a time when the fetus is growing rapidly. At the end of the ninth month, the fetus is about 20 inches in length and weighs 6-10 pounds.  BODY CHANGES Your body goes through many changes during pregnancy. The changes vary from woman to woman.   Your weight will continue to increase. You can expect to gain 25-35 pounds (11-16 kg) by the end of the pregnancy.  You may begin to get stretch marks on your hips, abdomen, and breasts.  You may urinate more often because the fetus is moving lower into your pelvis and pressing on your bladder.  You may develop or continue to have heartburn as a result of your pregnancy.  You may develop constipation because certain hormones are causing the muscles that push waste through your intestines to slow down.  You may develop hemorrhoids or swollen, bulging veins (varicose veins).  You may have pelvic pain because of the weight gain and pregnancy hormones relaxing your joints between the bones in your pelvis. Backaches may result from overexertion of the muscles supporting your posture.  You may have changes in your hair. These can include thickening of your hair, rapid growth, and changes in texture. Some women also have hair loss during or after pregnancy, or hair that feels dry or thin. Your hair will most likely return to normal after your baby is born.  Your breasts will continue to grow and be tender. A yellow discharge may leak from your breasts called colostrum.  Your belly button may stick out.  You may feel short of breath because of your expanding uterus.  You may notice the fetus "dropping," or moving lower in your abdomen.  You may have a bloody mucus discharge. This usually occurs a few days to a week before labor begins.  Your cervix becomes thin and soft (effaced) near your due date. WHAT TO EXPECT AT YOUR PRENATAL  EXAMS  You will have prenatal exams every 2 weeks until week 36. Then, you will have weekly prenatal exams. During a routine prenatal visit:  You will be weighed to make sure you and the fetus are growing normally.  Your blood pressure is taken.  Your abdomen will be measured to track your baby's growth.  The fetal heartbeat will be listened to.  Any test results from the previous visit will be discussed.  You may have a cervical check near your due date to see if you have effaced. At around 36 weeks, your caregiver will check your cervix. At the same time, your caregiver will also perform a test on the secretions of the vaginal tissue. This test is to determine if a type of bacteria, Group B streptococcus, is present. Your caregiver will explain this further. Your caregiver may ask you:  What your birth plan is.  How you are feeling.  If you are feeling the baby move.  If you have had any abnormal symptoms, such as leaking fluid, bleeding, severe headaches, or abdominal cramping.  If you have any questions. Other tests or screenings that may be performed during your third trimester include:  Blood tests that check for low iron levels (anemia).  Fetal testing to check the health, activity level, and growth of the fetus. Testing is done if you have certain medical conditions or if there are problems during the pregnancy. FALSE LABOR You may feel small, irregular contractions that   eventually go away. These are called Braxton Hicks contractions, or false labor. Contractions may last for hours, days, or even weeks before true labor sets in. If contractions come at regular intervals, intensify, or become painful, it is best to be seen by your caregiver.  SIGNS OF LABOR   Menstrual-like cramps.  Contractions that are 5 minutes apart or less.  Contractions that start on the top of the uterus and spread down to the lower abdomen and back.  A sense of increased pelvic pressure or back  pain.  A watery or bloody mucus discharge that comes from the vagina. If you have any of these signs before the 37th week of pregnancy, call your caregiver right away. You need to go to the hospital to get checked immediately. HOME CARE INSTRUCTIONS   Avoid all smoking, herbs, alcohol, and unprescribed drugs. These chemicals affect the formation and growth of the baby.  Follow your caregiver's instructions regarding medicine use. There are medicines that are either safe or unsafe to take during pregnancy.  Exercise only as directed by your caregiver. Experiencing uterine cramps is a good sign to stop exercising.  Continue to eat regular, healthy meals.  Wear a good support bra for breast tenderness.  Do not use hot tubs, steam rooms, or saunas.  Wear your seat belt at all times when driving.  Avoid raw meat, uncooked cheese, cat litter boxes, and soil used by cats. These carry germs that can cause birth defects in the baby.  Take your prenatal vitamins.  Try taking a stool softener (if your caregiver approves) if you develop constipation. Eat more high-fiber foods, such as fresh vegetables or fruit and whole grains. Drink plenty of fluids to keep your urine clear or pale yellow.  Take warm sitz baths to soothe any pain or discomfort caused by hemorrhoids. Use hemorrhoid cream if your caregiver approves.  If you develop varicose veins, wear support hose. Elevate your feet for 15 minutes, 3-4 times a day. Limit salt in your diet.  Avoid heavy lifting, wear low heal shoes, and practice good posture.  Rest a lot with your legs elevated if you have leg cramps or low back pain.  Visit your dentist if you have not gone during your pregnancy. Use a soft toothbrush to brush your teeth and be gentle when you floss.  A sexual relationship may be continued unless your caregiver directs you otherwise.  Do not travel far distances unless it is absolutely necessary and only with the approval  of your caregiver.  Take prenatal classes to understand, practice, and ask questions about the labor and delivery.  Make a trial run to the hospital.  Pack your hospital bag.  Prepare the baby's nursery.  Continue to go to all your prenatal visits as directed by your caregiver. SEEK MEDICAL CARE IF:  You are unsure if you are in labor or if your water has broken.  You have dizziness.  You have mild pelvic cramps, pelvic pressure, or nagging pain in your abdominal area.  You have persistent nausea, vomiting, or diarrhea.  You have a bad smelling vaginal discharge.  You have pain with urination. SEEK IMMEDIATE MEDICAL CARE IF:   You have a fever.  You are leaking fluid from your vagina.  You have spotting or bleeding from your vagina.  You have severe abdominal cramping or pain.  You have rapid weight loss or gain.  You have shortness of breath with chest pain.  You notice sudden or extreme swelling   of your face, hands, ankles, feet, or legs.  You have not felt your baby move in over an hour.  You have severe headaches that do not go away with medicine.  You have vision changes. Document Released: 10/15/2001 Document Revised: 10/26/2013 Document Reviewed: 12/22/2012 ExitCare Patient Information 2015 [employer]. This information is not intended to replace advice given to you by your health care provider. Make sure you discuss any questions you have with your health care provider.  

## 2015-01-26 NOTE — Progress Notes (Signed)
U/S 02/01/15 @ 11a with Radiology (no am appts avail 03/31).  Estanislado SpireJarai interpreter H'lus Dirden present.

## 2015-01-27 LAB — GC/CHLAMYDIA PROBE AMP
CT Probe RNA: POSITIVE — AB
GC Probe RNA: NEGATIVE

## 2015-01-30 ENCOUNTER — Telehealth: Payer: Self-pay

## 2015-01-30 MED ORDER — AZITHROMYCIN 500 MG PO TABS: 1000.0000 mg | ORAL_TABLET | Freq: Once | ORAL | Status: DC

## 2015-01-30 NOTE — Telephone Encounter (Addendum)
[facility] [phone]. Called patient with pacific interpreter [facility]. No answer. Left message stating we are calling with results, please call clinic.

## 2015-01-30 NOTE — Telephone Encounter (Signed)
-----   Message from Adam PhenixJames G Arnold, MD sent at 01/30/2015 12:54 PM EDT ----- Pos CT, Zithromax reordered

## 2015-01-30 NOTE — Addendum Note (Signed)
Addended by: Adam PhenixARNOLD, Aaleeyah Bias G on: 01/30/2015 12:53 PM   Modules accepted: Orders

## 2015-02-01 ENCOUNTER — Ambulatory Visit (HOSPITAL_COMMUNITY)
Admission: RE | Admit: 2015-02-01 | Discharge: 2015-02-01 | Disposition: A | Discharge status: Home / Self Care | Payer: BLUE CROSS/BLUE SHIELD | Source: Ambulatory Visit | Attending: Obstetrics & Gynecology | Admitting: Obstetrics & Gynecology

## 2015-02-01 DIAGNOSIS — Z3A32 32 weeks gestation of pregnancy: Secondary | ICD-10-CM | POA: Insufficient documentation

## 2015-02-01 DIAGNOSIS — Z36 Encounter for antenatal screening of mother: Secondary | ICD-10-CM | POA: Insufficient documentation

## 2015-02-01 DIAGNOSIS — O09293 Supervision of pregnancy with other poor reproductive or obstetric history, third trimester: Secondary | ICD-10-CM | POA: Insufficient documentation

## 2015-02-01 DIAGNOSIS — O09292 Supervision of pregnancy with other poor reproductive or obstetric history, second trimester: Secondary | ICD-10-CM | POA: Insufficient documentation

## 2015-02-01 DIAGNOSIS — O0993 Supervision of high risk pregnancy, unspecified, third trimester: Secondary | ICD-10-CM

## 2015-02-01 NOTE — Telephone Encounter (Signed)
Pt came to clinic following US today per my request. [facility]. I informed her of +Chlamydia infection which requires treatment with Rx. I also stated that she has tested positive for this infection at 2 other times during this pregnancy. I asked if she obtained the previously prescribed medication for the infection and she stated that she is not sure. I advised of the importance of receiving treatment and of her partner receiving treatment. She stated that she is aware of the STI clinic @ [facility] for her partner. I reviewed that she MUST wait for 2 weeks after they have BOTH received medication before having sexual intercourse again or she risks re-infection. She voiced understanding and stated that she will obtain her medication from the pharmacy today. Pt has clinic appt tomorrow and this was reviewed as well. I will confirm at that time that she has received her medication.

## 2015-02-02 ENCOUNTER — Ambulatory Visit (INDEPENDENT_AMBULATORY_CARE_PROVIDER_SITE_OTHER): Payer: BLUE CROSS/BLUE SHIELD | Admitting: Family Medicine

## 2015-02-02 VITALS — BP 108/62 | HR 97 | Wt 109.9 lb

## 2015-02-02 DIAGNOSIS — O98313 Other infections with a predominantly sexual mode of transmission complicating pregnancy, third trimester: Secondary | ICD-10-CM

## 2015-02-02 DIAGNOSIS — O09293 Supervision of pregnancy with other poor reproductive or obstetric history, third trimester: Secondary | ICD-10-CM | POA: Diagnosis not present

## 2015-02-02 DIAGNOSIS — A749 Chlamydial infection, unspecified: Secondary | ICD-10-CM

## 2015-02-02 LAB — POCT URINALYSIS DIP (DEVICE)
BILIRUBIN URINE: NEGATIVE
GLUCOSE, UA: NEGATIVE mg/dL
Ketones, ur: NEGATIVE mg/dL
NITRITE: NEGATIVE
Protein, ur: NEGATIVE mg/dL
Specific Gravity, Urine: 1.01 (ref 1.005–1.030)
UROBILINOGEN UA: 4 mg/dL — AB (ref 0.0–1.0)
pH: 6 (ref 5.0–8.0)

## 2015-02-02 MED ORDER — AZITHROMYCIN 500 MG PO TABS: 1000.0000 mg | ORAL_TABLET | Freq: Once | ORAL | Status: DC

## 2015-02-02 NOTE — Patient Instructions (Signed)
Third Trimester of Pregnancy The third trimester is from week 29 through week 42, months 7 through 9. The third trimester is a time when the fetus is growing rapidly. At the end of the ninth month, the fetus is about 20 inches in length and weighs 6-10 pounds.  BODY CHANGES Your body goes through many changes during pregnancy. The changes vary from woman to woman.   Your weight will continue to increase. You can expect to gain 25-35 pounds (11-16 kg) by the end of the pregnancy.  You may begin to get stretch marks on your hips, abdomen, and breasts.  You may urinate more often because the fetus is moving lower into your pelvis and pressing on your bladder.  You may develop or continue to have heartburn as a result of your pregnancy.  You may develop constipation because certain hormones are causing the muscles that push waste through your intestines to slow down.  You may develop hemorrhoids or swollen, bulging veins (varicose veins).  You may have pelvic pain because of the weight gain and pregnancy hormones relaxing your joints between the bones in your pelvis. Backaches may result from overexertion of the muscles supporting your posture.  You may have changes in your hair. These can include thickening of your hair, rapid growth, and changes in texture. Some women also have hair loss during or after pregnancy, or hair that feels dry or thin. Your hair will most likely return to normal after your baby is born.  Your breasts will continue to grow and be tender. A yellow discharge may leak from your breasts called colostrum.  Your belly button may stick out.  You may feel short of breath because of your expanding uterus.  You may notice the fetus "dropping," or moving lower in your abdomen.  You may have a bloody mucus discharge. This usually occurs a few days to a week before labor begins.  Your cervix becomes thin and soft (effaced) near your due date. WHAT TO EXPECT AT YOUR PRENATAL  EXAMS  You will have prenatal exams every 2 weeks until week 36. Then, you will have weekly prenatal exams. During a routine prenatal visit:  You will be weighed to make sure you and the fetus are growing normally.  Your blood pressure is taken.  Your abdomen will be measured to track your baby's growth.  The fetal heartbeat will be listened to.  Any test results from the previous visit will be discussed.  You may have a cervical check near your due date to see if you have effaced. At around 36 weeks, your caregiver will check your cervix. At the same time, your caregiver will also perform a test on the secretions of the vaginal tissue. This test is to determine if a type of bacteria, Group B streptococcus, is present. Your caregiver will explain this further. Your caregiver may ask you:  What your birth plan is.  How you are feeling.  If you are feeling the baby move.  If you have had any abnormal symptoms, such as leaking fluid, bleeding, severe headaches, or abdominal cramping.  If you have any questions. Other tests or screenings that may be performed during your third trimester include:  Blood tests that check for low iron levels (anemia).  Fetal testing to check the health, activity level, and growth of the fetus. Testing is done if you have certain medical conditions or if there are problems during the pregnancy. FALSE LABOR You may feel small, irregular contractions that   eventually go away. These are called Braxton Hicks contractions, or false labor. Contractions may last for hours, days, or even weeks before true labor sets in. If contractions come at regular intervals, intensify, or become painful, it is best to be seen by your caregiver.  SIGNS OF LABOR   Menstrual-like cramps.  Contractions that are 5 minutes apart or less.  Contractions that start on the top of the uterus and spread down to the lower abdomen and back.  A sense of increased pelvic pressure or back  pain.  A watery or bloody mucus discharge that comes from the vagina. If you have any of these signs before the 37th week of pregnancy, call your caregiver right away. You need to go to the hospital to get checked immediately. HOME CARE INSTRUCTIONS   Avoid all smoking, herbs, alcohol, and unprescribed drugs. These chemicals affect the formation and growth of the baby.  Follow your caregiver's instructions regarding medicine use. There are medicines that are either safe or unsafe to take during pregnancy.  Exercise only as directed by your caregiver. Experiencing uterine cramps is a good sign to stop exercising.  Continue to eat regular, healthy meals.  Wear a good support bra for breast tenderness.  Do not use hot tubs, steam rooms, or saunas.  Wear your seat belt at all times when driving.  Avoid raw meat, uncooked cheese, cat litter boxes, and soil used by cats. These carry germs that can cause birth defects in the baby.  Take your prenatal vitamins.  Try taking a stool softener (if your caregiver approves) if you develop constipation. Eat more high-fiber foods, such as fresh vegetables or fruit and whole grains. Drink plenty of fluids to keep your urine clear or pale yellow.  Take warm sitz baths to soothe any pain or discomfort caused by hemorrhoids. Use hemorrhoid cream if your caregiver approves.  If you develop varicose veins, wear support hose. Elevate your feet for 15 minutes, 3-4 times a day. Limit salt in your diet.  Avoid heavy lifting, wear low heal shoes, and practice good posture.  Rest a lot with your legs elevated if you have leg cramps or low back pain.  Visit your dentist if you have not gone during your pregnancy. Use a soft toothbrush to brush your teeth and be gentle when you floss.  A sexual relationship may be continued unless your caregiver directs you otherwise.  Do not travel far distances unless it is absolutely necessary and only with the approval  of your caregiver.  Take prenatal classes to understand, practice, and ask questions about the labor and delivery.  Make a trial run to the hospital.  Pack your hospital bag.  Prepare the baby's nursery.  Continue to go to all your prenatal visits as directed by your caregiver. SEEK MEDICAL CARE IF:  You are unsure if you are in labor or if your water has broken.  You have dizziness.  You have mild pelvic cramps, pelvic pressure, or nagging pain in your abdominal area.  You have persistent nausea, vomiting, or diarrhea.  You have a bad smelling vaginal discharge.  You have pain with urination. SEEK IMMEDIATE MEDICAL CARE IF:   You have a fever.  You are leaking fluid from your vagina.  You have spotting or bleeding from your vagina.  You have severe abdominal cramping or pain.  You have rapid weight loss or gain.  You have shortness of breath with chest pain.  You notice sudden or extreme swelling   of your face, hands, ankles, feet, or legs.  You have not felt your baby move in over an hour.  You have severe headaches that do not go away with medicine.  You have vision changes. Document Released: 10/15/2001 Document Revised: 10/26/2013 Document Reviewed: 12/22/2012 ExitCare Patient Information 2015 [employer]. This information is not intended to replace advice given to you by your health care provider. Make sure you discuss any questions you have with your health care provider.  

## 2015-02-02 NOTE — Progress Notes (Signed)
NST reactive Partner refusing to be treated - per patient, does not want to be examined.  Prescription written for patient's husband for azithromycin Counseled patient that chlamydia can cause blindness and that they should not have sex for 10 days.

## 2015-02-02 NOTE — Progress Notes (Signed)
Used interpreter H-lus [place]. States ran out of prenatal vitamins. Given choice of getting free PNV from health department or send in RX. States will go to health department. Patient states she has not gotten the zithromax yet. We discussed the importance of her taking the zithromax as soon as possible. We discussed importance of her partner also getting treatment from health department or his doctor and no sexual contact until both treated for at least 2 weeks. We discussed she has tested positive before . She states her partner refuses to go to doctor and get treated.

## 2015-02-02 NOTE — Progress Notes (Signed)
Moderate hgb, high amounts of urobilinogen and trace leuks present in urine

## 2015-02-06 ENCOUNTER — Ambulatory Visit (INDEPENDENT_AMBULATORY_CARE_PROVIDER_SITE_OTHER): Payer: BLUE CROSS/BLUE SHIELD | Admitting: *Deleted

## 2015-02-06 VITALS — BP 97/54 | HR 101

## 2015-02-06 DIAGNOSIS — O09293 Supervision of pregnancy with other poor reproductive or obstetric history, third trimester: Secondary | ICD-10-CM | POA: Diagnosis not present

## 2015-02-09 ENCOUNTER — Ambulatory Visit (INDEPENDENT_AMBULATORY_CARE_PROVIDER_SITE_OTHER): Payer: BLUE CROSS/BLUE SHIELD | Admitting: Obstetrics and Gynecology

## 2015-02-09 ENCOUNTER — Encounter: Payer: Self-pay | Admitting: Obstetrics and Gynecology

## 2015-02-09 VITALS — BP 108/60 | HR 83 | Wt 115.0 lb

## 2015-02-09 DIAGNOSIS — O09293 Supervision of pregnancy with other poor reproductive or obstetric history, third trimester: Secondary | ICD-10-CM

## 2015-02-09 DIAGNOSIS — O0993 Supervision of high risk pregnancy, unspecified, third trimester: Secondary | ICD-10-CM

## 2015-02-09 DIAGNOSIS — A749 Chlamydial infection, unspecified: Secondary | ICD-10-CM

## 2015-02-09 DIAGNOSIS — O98813 Other maternal infectious and parasitic diseases complicating pregnancy, third trimester: Secondary | ICD-10-CM

## 2015-02-09 DIAGNOSIS — Z789 Other specified health status: Secondary | ICD-10-CM

## 2015-02-09 DIAGNOSIS — O09893 Supervision of other high risk pregnancies, third trimester: Secondary | ICD-10-CM

## 2015-02-09 LAB — POCT URINALYSIS DIP (DEVICE)
Bilirubin Urine: NEGATIVE
GLUCOSE, UA: NEGATIVE mg/dL
KETONES UR: NEGATIVE mg/dL
Nitrite: NEGATIVE
Protein, ur: NEGATIVE mg/dL
SPECIFIC GRAVITY, URINE: 1.01 (ref 1.005–1.030)
Urobilinogen, UA: 0.2 mg/dL (ref 0.0–1.0)
pH: 6.5 (ref 5.0–8.0)

## 2015-02-09 NOTE — Progress Notes (Signed)
Patient is doing well without complaints. FM/PTL precautions reviewed. F/U growth ultrasound scheduled NST reviewed and reactive

## 2015-02-09 NOTE — Progress Notes (Signed)
Mod hgb and trace leuks in urine.

## 2015-02-14 ENCOUNTER — Ambulatory Visit (INDEPENDENT_AMBULATORY_CARE_PROVIDER_SITE_OTHER): Payer: BLUE CROSS/BLUE SHIELD | Admitting: *Deleted

## 2015-02-14 VITALS — BP 110/58 | HR 100

## 2015-02-14 DIAGNOSIS — O09293 Supervision of pregnancy with other poor reproductive or obstetric history, third trimester: Secondary | ICD-10-CM | POA: Diagnosis not present

## 2015-02-14 NOTE — Progress Notes (Signed)
NST reactive.

## 2015-02-16 ENCOUNTER — Ambulatory Visit (INDEPENDENT_AMBULATORY_CARE_PROVIDER_SITE_OTHER): Payer: BLUE CROSS/BLUE SHIELD | Admitting: Family Medicine

## 2015-02-16 VITALS — BP 98/58 | HR 99 | Wt 112.5 lb

## 2015-02-16 DIAGNOSIS — O09293 Supervision of pregnancy with other poor reproductive or obstetric history, third trimester: Secondary | ICD-10-CM

## 2015-02-16 DIAGNOSIS — O0993 Supervision of high risk pregnancy, unspecified, third trimester: Secondary | ICD-10-CM

## 2015-02-16 LAB — POCT URINALYSIS DIP (DEVICE)
Bilirubin Urine: NEGATIVE
Glucose, UA: NEGATIVE mg/dL
Ketones, ur: NEGATIVE mg/dL
Nitrite: NEGATIVE
PH: 7 (ref 5.0–8.0)
Protein, ur: NEGATIVE mg/dL
Specific Gravity, Urine: 1.015 (ref 1.005–1.030)
UROBILINOGEN UA: 4 mg/dL — AB (ref 0.0–1.0)

## 2015-02-16 LAB — US OB LIMITED

## 2015-02-16 NOTE — Progress Notes (Signed)
Candelaria CelesteWier H Siu used for interpreter

## 2015-02-16 NOTE — Progress Notes (Signed)
UA showed small leukocytes, mod blood, elevated urobil

## 2015-02-16 NOTE — Progress Notes (Signed)
NST reactive. AFI 16.5 No concerns.  FM/PTL precautions reviewed.

## 2015-02-17 NOTE — Progress Notes (Signed)
NST reactive.

## 2015-02-20 ENCOUNTER — Ambulatory Visit (INDEPENDENT_AMBULATORY_CARE_PROVIDER_SITE_OTHER): Payer: BLUE CROSS/BLUE SHIELD | Admitting: *Deleted

## 2015-02-20 ENCOUNTER — Inpatient Hospital Stay (HOSPITAL_COMMUNITY)
Admission: AD | Admit: 2015-02-20 | Discharge: 2015-02-20 | Disposition: A | Discharge status: Home / Self Care | Payer: BLUE CROSS/BLUE SHIELD | Source: Ambulatory Visit | Attending: Obstetrics and Gynecology | Admitting: Obstetrics and Gynecology

## 2015-02-20 ENCOUNTER — Encounter (HOSPITAL_COMMUNITY): Payer: Self-pay | Admitting: *Deleted

## 2015-02-20 VITALS — BP 109/65 | HR 134

## 2015-02-20 DIAGNOSIS — O26899 Other specified pregnancy related conditions, unspecified trimester: Secondary | ICD-10-CM | POA: Diagnosis present

## 2015-02-20 DIAGNOSIS — R011 Cardiac murmur, unspecified: Secondary | ICD-10-CM | POA: Diagnosis not present

## 2015-02-20 DIAGNOSIS — Z3A34 34 weeks gestation of pregnancy: Secondary | ICD-10-CM | POA: Diagnosis not present

## 2015-02-20 DIAGNOSIS — O26893 Other specified pregnancy related conditions, third trimester: Secondary | ICD-10-CM

## 2015-02-20 DIAGNOSIS — O09293 Supervision of pregnancy with other poor reproductive or obstetric history, third trimester: Secondary | ICD-10-CM | POA: Diagnosis not present

## 2015-02-20 DIAGNOSIS — R000 Tachycardia, unspecified: Secondary | ICD-10-CM | POA: Diagnosis present

## 2015-02-20 DIAGNOSIS — R0602 Shortness of breath: Secondary | ICD-10-CM | POA: Diagnosis present

## 2015-02-20 DIAGNOSIS — D649 Anemia, unspecified: Secondary | ICD-10-CM | POA: Insufficient documentation

## 2015-02-20 DIAGNOSIS — J069 Acute upper respiratory infection, unspecified: Secondary | ICD-10-CM | POA: Diagnosis not present

## 2015-02-20 DIAGNOSIS — O99013 Anemia complicating pregnancy, third trimester: Secondary | ICD-10-CM | POA: Diagnosis not present

## 2015-02-20 DIAGNOSIS — R0600 Dyspnea, unspecified: Secondary | ICD-10-CM

## 2015-02-20 DIAGNOSIS — O4703 False labor before 37 completed weeks of gestation, third trimester: Secondary | ICD-10-CM

## 2015-02-20 DIAGNOSIS — O9989 Other specified diseases and conditions complicating pregnancy, childbirth and the puerperium: Secondary | ICD-10-CM | POA: Diagnosis not present

## 2015-02-20 HISTORY — DX: Essential (primary) hypertension: I10

## 2015-02-20 LAB — URINE MICROSCOPIC-ADD ON

## 2015-02-20 LAB — COMPREHENSIVE METABOLIC PANEL
ALBUMIN: 2.6 g/dL — AB (ref 3.5–5.2)
ALT: 8 U/L (ref 0–35)
ANION GAP: 8 (ref 5–15)
AST: 18 U/L (ref 0–37)
Alkaline Phosphatase: 192 U/L — ABNORMAL HIGH (ref 39–117)
BUN: 5 mg/dL — AB (ref 6–23)
CHLORIDE: 103 mmol/L (ref 96–112)
CO2: 24 mmol/L (ref 19–32)
CREATININE: 0.39 mg/dL — AB (ref 0.50–1.10)
Calcium: 8.1 mg/dL — ABNORMAL LOW (ref 8.4–10.5)
GFR calc Af Amer: >90 mL/min (ref >90)
GFR calc non Af Amer: >90 mL/min (ref >90)
Glucose, Bld: 80 mg/dL (ref 70–99)
Potassium: 3.3 mmol/L — ABNORMAL LOW (ref 3.5–5.1)
Sodium: 135 mmol/L (ref 135–145)
TOTAL PROTEIN: 6 g/dL (ref 6.0–8.3)
Total Bilirubin: 0.4 mg/dL (ref 0.3–1.2)

## 2015-02-20 LAB — URINALYSIS, ROUTINE W REFLEX MICROSCOPIC
Bilirubin Urine: NEGATIVE
GLUCOSE, UA: NEGATIVE mg/dL
Ketones, ur: NEGATIVE mg/dL
Nitrite: NEGATIVE
PROTEIN: NEGATIVE mg/dL
Specific Gravity, Urine: 1.01 (ref 1.005–1.030)
UROBILINOGEN UA: 1 mg/dL (ref 0.0–1.0)
pH: 6.5 (ref 5.0–8.0)

## 2015-02-20 LAB — CBC
HEMATOCRIT: 27.3 % — AB (ref 36.0–46.0)
Hemoglobin: 9.2 g/dL — ABNORMAL LOW (ref 12.0–15.0)
MCH: 27 pg (ref 26.0–34.0)
MCHC: 33.7 g/dL (ref 30.0–36.0)
MCV: 80.1 fL (ref 78.0–100.0)
Platelets: 166 10*3/uL (ref 150–400)
RBC: 3.41 MIL/uL — ABNORMAL LOW (ref 3.87–5.11)
RDW: 13.8 % (ref 11.5–15.5)
WBC: 8.9 10*3/uL (ref 4.0–10.5)

## 2015-02-20 MED ORDER — LACTATED RINGERS IV BOLUS (SEPSIS)
1000.0000 mL | Freq: Once | INTRAVENOUS | Status: AC
  Administered 2015-02-20: 1000 mL via INTRAVENOUS

## 2015-02-20 MED ORDER — NIFEDIPINE 10 MG PO CAPS
10.0000 mg | ORAL_CAPSULE | ORAL | Status: DC | PRN
  Administered 2015-02-20 (×2): 10 mg via ORAL
  Filled 2015-02-20 (×3): qty 1

## 2015-02-20 MED ORDER — OXYCODONE-ACETAMINOPHEN 5-325 MG PO TABS
2.0000 | ORAL_TABLET | Freq: Once | ORAL | Status: AC
  Administered 2015-02-20: 2 via ORAL
  Filled 2015-02-20: qty 2

## 2015-02-20 NOTE — [facility] Note (Signed)
Pt sent from Clinic for SOB & tachycardia.  Pt states she has had SOB since yesterday, was not aware she was tachycardic until she was seen in clinic this a.m.  Has lower abd & back pain, also chest pain since yesterday.  Denies bleeding or LOF.

## 2015-02-20 NOTE — Progress Notes (Signed)
4/18 NST reviewed and reactive 

## 2015-02-20 NOTE — Discharge Instructions (Signed)
Shortness of Breath Shortness of breath means you have trouble breathing. It could also mean that you have a medical problem. You should get immediate medical care for shortness of breath. CAUSES   Not enough oxygen in the air such as with high altitudes or a smoke-filled room.  Certain lung diseases, infections, or problems.  Heart disease or conditions, such as angina or heart failure.  Low red blood cells (anemia).  Poor physical fitness, which can cause shortness of breath when you exercise.  Chest or back injuries or stiffness.  Being overweight.  Smoking.  Anxiety, which can make you feel like you are not getting enough air. DIAGNOSIS  Serious medical problems can often be found during your physical exam. Tests may also be done to determine why you are having shortness of breath. Tests may include:  Chest X-rays.  Lung function tests.  Blood tests.  An electrocardiogram (ECG).  An ambulatory electrocardiogram. An ambulatory ECG records your heartbeat patterns over a 24-hour period.  Exercise testing.  A transthoracic echocardiogram (TTE). During echocardiography, sound waves are used to evaluate how blood flows through your heart.  A transesophageal echocardiogram (TEE).  Imaging scans. Your health care provider may not be able to find a cause for your shortness of breath after your exam. In this case, it is important to have a follow-up exam with your health care provider as directed.  TREATMENT  Treatment for shortness of breath depends on the cause of your symptoms and can vary greatly. HOME CARE INSTRUCTIONS   Do not smoke. Smoking is a common cause of shortness of breath. If you smoke, ask for help to quit.  Avoid being around chemicals or things that may bother your breathing, such as paint fumes and dust.  Rest as needed. Slowly resume your usual activities.  If medicines were prescribed, take them as directed for the full length of time directed. This  includes oxygen and any inhaled medicines.  Keep all follow-up appointments as directed by your health care provider. SEEK MEDICAL CARE IF:   Your condition does not improve in the time expected.  You have a hard time doing your normal activities even with rest.  You have any new symptoms. SEEK IMMEDIATE MEDICAL CARE IF:   Your shortness of breath gets worse.  You feel light-headed, faint, or develop a cough not controlled with medicines.  You start coughing up blood.  You have pain with breathing.  You have chest pain or pain in your arms, shoulders, or abdomen.  You have a fever.  You are unable to walk up stairs or exercise the way you normally do. MAKE SURE YOU:  Understand these instructions.  Will watch your condition.  Will get help right away if you are not doing well or get worse. Document Released: 07/16/2001 Document Revised: 10/26/2013 Document Reviewed: 01/06/2012 [facility] Patient Information 2015 [place], [place]. This information is not intended to replace advice given to you by your health care provider. Make sure you discuss any questions you have with your health care provider.  Third Trimester of Pregnancy The third trimester is from week 29 through week 42, months 7 through 9. The third trimester is a time when the fetus is growing rapidly. At the end of the ninth month, the fetus is about 20 inches in length and weighs 6-10 pounds.  BODY CHANGES Your body goes through many changes during pregnancy. The changes vary from woman to woman.   Your weight will continue to increase. You can  expect to gain 25-35 pounds (11-16 kg) by the end of the pregnancy.  You may begin to get stretch marks on your hips, abdomen, and breasts.  You may urinate more often because the fetus is moving lower into your pelvis and pressing on your bladder.  You may develop or continue to have heartburn as a result of your pregnancy.  You may develop constipation because certain  hormones are causing the muscles that push waste through your intestines to slow down.  You may develop hemorrhoids or swollen, bulging veins (varicose veins).  You may have pelvic pain because of the weight gain and pregnancy hormones relaxing your joints between the bones in your pelvis. Backaches may result from overexertion of the muscles supporting your posture.  You may have changes in your hair. These can include thickening of your hair, rapid growth, and changes in texture. Some women also have hair loss during or after pregnancy, or hair that feels dry or thin. Your hair will most likely return to normal after your baby is born.  Your breasts will continue to grow and be tender. A yellow discharge may leak from your breasts called colostrum.  Your belly button may stick out.  You may feel short of breath because of your expanding uterus.  You may notice the fetus "dropping," or moving lower in your abdomen.  You may have a bloody mucus discharge. This usually occurs a few days to a week before labor begins.  Your cervix becomes thin and soft (effaced) near your due date. WHAT TO EXPECT AT YOUR PRENATAL EXAMS  You will have prenatal exams every 2 weeks until week 36. Then, you will have weekly prenatal exams. During a routine prenatal visit:  You will be weighed to make sure you and the fetus are growing normally.  Your blood pressure is taken.  Your abdomen will be measured to track your baby's growth.  The fetal heartbeat will be listened to.  Any test results from the previous visit will be discussed.  You may have a cervical check near your due date to see if you have effaced. At around 36 weeks, your caregiver will check your cervix. At the same time, your caregiver will also perform a test on the secretions of the vaginal tissue. This test is to determine if a type of bacteria, Group B streptococcus, is present. Your caregiver will explain this further. Your caregiver  may ask you:  What your birth plan is.  How you are feeling.  If you are feeling the baby move.  If you have had any abnormal symptoms, such as leaking fluid, bleeding, severe headaches, or abdominal cramping.  If you have any questions. Other tests or screenings that may be performed during your third trimester include:  Blood tests that check for low iron levels (anemia).  Fetal testing to check the health, activity level, and growth of the fetus. Testing is done if you have certain medical conditions or if there are problems during the pregnancy. FALSE LABOR You may feel small, irregular contractions that eventually go away. These are called Braxton Hicks contractions, or false labor. Contractions may last for hours, days, or even weeks before true labor sets in. If contractions come at regular intervals, intensify, or become painful, it is best to be seen by your caregiver.  SIGNS OF LABOR   Menstrual-like cramps.  Contractions that are 5 minutes apart or less.  Contractions that start on the top of the uterus and spread down to  the lower abdomen and back.  A sense of increased pelvic pressure or back pain.  A watery or bloody mucus discharge that comes from the vagina. If you have any of these signs before the 37th week of pregnancy, call your caregiver right away. You need to go to the hospital to get checked immediately. HOME CARE INSTRUCTIONS   Avoid all smoking, herbs, alcohol, and unprescribed drugs. These chemicals affect the formation and growth of the baby.  Follow your caregiver's instructions regarding medicine use. There are medicines that are either safe or unsafe to take during pregnancy.  Exercise only as directed by your caregiver. Experiencing uterine cramps is a good sign to stop exercising.  Continue to eat regular, healthy meals.  Wear a good support bra for breast tenderness.  Do not use hot tubs, steam rooms, or saunas.  Wear your seat belt at all  times when driving.  Avoid raw meat, uncooked cheese, cat litter boxes, and soil used by cats. These carry germs that can cause birth defects in the baby.  Take your prenatal vitamins.  Try taking a stool softener (if your caregiver approves) if you develop constipation. Eat more high-fiber foods, such as fresh vegetables or fruit and whole grains. Drink plenty of fluids to keep your urine clear or pale yellow.  Take warm sitz baths to soothe any pain or discomfort caused by hemorrhoids. Use hemorrhoid cream if your caregiver approves.  If you develop varicose veins, wear support hose. Elevate your feet for 15 minutes, 3-4 times a day. Limit salt in your diet.  Avoid heavy lifting, wear low heal shoes, and practice good posture.  Rest a lot with your legs elevated if you have leg cramps or low back pain.  Visit your dentist if you have not gone during your pregnancy. Use a soft toothbrush to brush your teeth and be gentle when you floss.  A sexual relationship may be continued unless your caregiver directs you otherwise.  Do not travel far distances unless it is absolutely necessary and only with the approval of your caregiver.  Take prenatal classes to understand, practice, and ask questions about the labor and delivery.  Make a trial run to the hospital.  Pack your hospital bag.  Prepare the baby's nursery.  Continue to go to all your prenatal visits as directed by your caregiver. SEEK MEDICAL CARE IF:  You are unsure if you are in labor or if your water has broken.  You have dizziness.  You have mild pelvic cramps, pelvic pressure, or nagging pain in your abdominal area.  You have persistent nausea, vomiting, or diarrhea.  You have a bad smelling vaginal discharge.  You have pain with urination. SEEK IMMEDIATE MEDICAL CARE IF:   You have a fever.  You are leaking fluid from your vagina.  You have spotting or bleeding from your vagina.  You have severe abdominal  cramping or pain.  You have rapid weight loss or gain.  You have shortness of breath with chest pain.  You notice sudden or extreme swelling of your face, hands, ankles, feet, or legs.  You have not felt your baby move in over an hour.  You have severe headaches that do not go away with medicine.  You have vision changes. Document Released: 10/15/2001 Document Revised: 10/26/2013 Document Reviewed: 12/22/2012 [facility] Patient Information 2015 [place], [place]. This information is not intended to replace advice given to you by your health care provider. Make sure you discuss any questions you have  with your health care provider. ° °

## 2015-02-20 NOTE — MAU Provider Note (Signed)
Chief Complaint:  Shortness of Breath   First Provider Initiated Contact with Patient 02/20/15 [phone]     HPI: Danielle Hoover is a 34 y.o. [id number] at 1237w6d who presents [facility] for maternal tachycardia and SOB. Pt reports few episode of SOB in the past week. Also has had nasal congestion and sore throat  since yesterday and increase contractions x a few days. Denies fever, chills, cough, wheezing, palpitations, chest pain, calf pain, recent travel or prolonged immobilization, leakage of fluid or vaginal bleeding. Good fetal movement. Unaware of tachycardia. Per Hx in Epic pt has Hx Asthma, but pt denies and states she has never been Rx'd albuterol or given any kind of breathing Tx. Hx Pre-E, but no other Hx cardiac Dz. Had CT Angiography done 05/2007 for Chest pain after Dx Fetal Demise--Normal.    Pregnancy Course: Complicated by Hx Pre-E and and IUFD  At 31.5 weeks in that pregnancy. In antenatal testing.   Past Medical History: Past Medical History  Diagnosis Date  . Anemia     during pregnancy 2006  . Headache   . Asthma--Pt denies   . Hypertension     Past obstetric history: OB History  Gravida Para Term Preterm AB SAB TAB Ectopic Multiple Living  4 3 2 1      2     # Outcome Date GA Lbr Len/2nd Weight Sex Delivery Anes PTL Lv  4 Current           3 Term 05/05/11 319w5d  6 lb 14 oz (3.118 kg) M Vag-Spont     2 Preterm 05/20/07 2122w5d    Vag-Spont  N FD     Complications: Hypertension in pregnancy, preeclampsia, severe, delivered/postpartum     Comments: IUFD/ no fetal heart activity U/S on 05/19/07  1 Term 05/06/05 5513w3d 24:00 6 lb 2 oz (2.778 kg) F Vag-Spont EPI       Comments: anemia      Past Surgical History: Past Surgical History  Procedure Laterality Date  . No past surgeries       Family History: Family History  Problem Relation Age of Onset  . Alcohol abuse Neg Hx   . Arthritis Neg Hx   . Asthma Neg Hx   . Birth defects Neg Hx   .  Cancer Neg Hx   . COPD Neg Hx   . Depression Neg Hx   . Diabetes Neg Hx   . Drug abuse Neg Hx   . Early death Neg Hx   . Hearing loss Neg Hx   . Heart disease Neg Hx   . Hyperlipidemia Neg Hx   . Hypertension Neg Hx   . Kidney disease Neg Hx   . Learning disabilities Neg Hx   . Mental illness Neg Hx   . Mental retardation Neg Hx   . Miscarriages / Stillbirths Neg Hx   . Stroke Neg Hx   . Vision loss Neg Hx   . Varicose Veins Neg Hx     Social History: History  Substance Use Topics  . Smoking status: Never Smoker   . Smokeless tobacco: Never Used  . Alcohol Use: No    Allergies: No Known Allergies  Meds:  Prescriptions prior to admission  Medication Sig Dispense Refill Last Dose  . aspirin 81 MG chewable tablet Chew 1 tablet (81 mg total) by mouth daily. 60 tablet 3 02/19/2015 at Unknown time  . prenatal vitamin w/FE, FA (PRENATAL 1 + 1) 27-1  MG TABS tablet Take 1 tablet by mouth daily at 12 noon.   02/19/2015 at Unknown time   ROS:  Review of Systems  Constitutional: Positive for chills (one episode last night. None since. ). Negative for fever and diaphoresis.  HENT: Positive for congestion and sore throat. Negative for ear pain.   Eyes: Negative for blurred vision.  Respiratory: Positive for shortness of breath. Negative for cough, hemoptysis, sputum production and wheezing.   Cardiovascular: Negative for chest pain, palpitations and leg swelling.  Gastrointestinal: Positive for abdominal pain (contractrions only). Negative for heartburn, nausea and vomiting.  Genitourinary:       Neg for VB, LOF.  Musculoskeletal: Positive for back pain (w/ contractions.). Negative for myalgias.  Neurological: Negative for dizziness, loss of consciousness, weakness and headaches.   Physical Exam  Blood pressure 99/63, pulse 133, temperature 98.6 F (37 C), temperature source Oral, resp. rate 18, last menstrual period 06/21/2014, SpO2 98 %.  Patient Vitals for the past 24 hrs:  BP  Temp Temp src Pulse Resp SpO2  02/20/15 1412 93/60 mmHg - - (!) 127 18 -  02/20/15 1148 - - - (!) 133 - 98 %  02/20/15 1143 - - - (!) 141 - 99 %  02/20/15 1138 - - - (!) 139 - 99 %  02/20/15 1133 - - - (!) 138 - 98 %  02/20/15 1104 99/63 mmHg - - - - -  02/20/15 0916 - - - (!) 141 - 100 %  02/20/15 0909 97/61 mmHg 98.6 F (37 C) Oral (!) 143 18 100 %   GENERAL: Well-developed, well-nourished female in no acute distress. No Cyanosis or Pallor.  HEART: Tachycardia. Regular rhythm. 3/6 Systolic Murmur.  RESP: normal effort and rate. CTAB.  GI: Abd soft, non-tender, gravid appropriate for gestational age.  MS: Extremities nontender, no edema, normal ROM NEURO: Alert and oriented x 4.  GU: NEFG, physiologic discharge, no blood.  Dilation: 1.5 Effacement (%): 50 Station: -3 Exam by:: Morrison Old RN  FHT:  Baseline 140 , moderate variability, accelerations present, no decelerations Contractions: q 2-6 mins, mild   Labs: Results for orders placed or performed during the hospital encounter of 02/20/15 (from the past 24 hour(s))  Urinalysis, Routine w reflex microscopic     Status: Abnormal   Collection Time: 02/20/15  8:50 AM  Result Value Ref Range   Color, Urine YELLOW YELLOW   APPearance CLEAR CLEAR   Specific Gravity, Urine 1.010 1.005 - 1.030   pH 6.5 5.0 - 8.0   Glucose, UA NEGATIVE NEGATIVE mg/dL   Hgb urine dipstick MODERATE (A) NEGATIVE   Bilirubin Urine NEGATIVE NEGATIVE   Ketones, ur NEGATIVE NEGATIVE mg/dL   Protein, ur NEGATIVE NEGATIVE mg/dL   Urobilinogen, UA 1.0 0.0 - 1.0 mg/dL   Nitrite NEGATIVE NEGATIVE   Leukocytes, UA TRACE (A) NEGATIVE  Urine microscopic-add on     Status: Abnormal   Collection Time: 02/20/15  8:50 AM  Result Value Ref Range   Squamous Epithelial / LPF FEW (A) RARE   WBC, UA 0-2 <3 WBC/hpf   RBC / HPF 0-2 <3 RBC/hpf   Bacteria, UA RARE RARE  CBC     Status: Abnormal   Collection Time: 02/20/15 10:15 AM  Result Value Ref Range   WBC 8.9  4.0 - 10.5 K/uL   RBC 3.41 (L) 3.87 - 5.11 MIL/uL   Hemoglobin 9.2 (L) 12.0 - 15.0 g/dL   HCT 19.1 (L) 47.8 - 29.5 %  MCV 80.1 78.0 - 100.0 fL   MCH 27.0 26.0 - 34.0 pg   MCHC 33.7 30.0 - 36.0 g/dL   RDW 30.8 65.7 - 84.6 %   Platelets 166 150 - 400 K/uL  Comprehensive metabolic panel     Status: Abnormal   Collection Time: 02/20/15 10:41 AM  Result Value Ref Range   Sodium 135 135 - 145 mmol/L   Potassium 3.3 (L) 3.5 - 5.1 mmol/L   Chloride 103 96 - 112 mmol/L   CO2 24 19 - 32 mmol/L   Glucose, Bld 80 70 - 99 mg/dL   BUN 5 (L) 6 - 23 mg/dL   Creatinine, Ser 9.62 (L) 0.50 - 1.10 mg/dL   Calcium 8.1 (L) 8.4 - 10.5 mg/dL   Total Protein 6.0 6.0 - 8.3 g/dL   Albumin 2.6 (L) 3.5 - 5.2 g/dL   AST 18 0 - 37 U/L   ALT 8 0 - 35 U/L   Alkaline Phosphatase 192 (H) 39 - 117 U/L   Total Bilirubin 0.4 0.3 - 1.2 mg/dL   GFR calc non Af Amer >90 >90 mL/min   GFR calc Af Amer >90 >90 mL/min   Anion gap 8 5 - 15    EKG: Sinus tachycardia w/ nonspecific abnormal T wave abnormality   MAU Course: Reviewed EKG w/ Dr. Johney Frame, cardiologist. Unconcerned for MI, emergent cardia condition. If SOB concerning for PE, work-up for that. If low suspicion for PE, order OP Echo. Wells score 1.5 (low). CNM has low suspicion for PE due to few risk factors, well-appearing pt, Sx possibly partially due to preterm contractions.  Mild improvement in tachycardia w/ PO hydration. Pt more uncomfortable w. UC's. IV bolus and procardia ordered.   SOB resolved. Tachycardia continues, but pt asymptomatic. Cervix unchanged. Will D/C home w/ OP cardiology F/U and Echo per consult w/ Dr. Jolayne Panther.   Assessment: 1. Shortness of breath due to pregnancy, third trimester   2. Preterm contractions, third trimester   3. Sinus tachycardia   4. Heart murmur previously undiagnosed   5. Acute upper respiratory infection   6. Anemia complicating pregnancy in third trimester    Plan: Discharge home in stable condition per  consult w/ Dr. Jolayne Panther.  Preterm labor precautions and fetal kick counts. Increase fluids and rest. PE precautions. URI comfort measures. Follow-up Information    Follow up with [facility] On 02/23/2015.   Specialty:  Obstetrics and Gynecology   Why:  for NST and follow-up of Shortness of breath, tachycardia   Contact information:   [address] [place] [address] [phone]      Follow up with [facility] [place] [facility] AD[facility].   Why:  As needed if symptoms worsen   Contact information:   [address] [id number] mc [place] [address] [phone]      Follow up with Hillis Range, MD.   Specialty:  Cardiology   Why:  will call to schedule Echo and cardiology appointment   Contact information:   [address] [place] [address] [phone]         Medication List    TAKE these medications        aspirin 81 MG chewable tablet  Chew 1 tablet (81 mg total) by mouth daily.     prenatal vitamin w/FE, FA 27-1 MG Tabs tablet  Take 1 tablet by mouth daily at 12 noon.       Dorathy Kinsman,  CNM 02/20/2015 2:03 PM

## 2015-02-20 NOTE — MAU Note (Signed)
Sent from clinic for further evaluation of SOB and tachycardia.

## 2015-02-20 NOTE — Progress Notes (Signed)
Interpreter Elenor LegatoWier Siu present for encounter.  Pt reports that she is having difficulty breathing since yesterday. She is also having back pain and pain in both legs. Pulse Ox applied during NST due to tachycardia observed w/BP reading.  After NST completed, pt status reviewed with Dr. Penne LashLeggett.  Pt taken to MAU for further evaluation.

## 2015-02-23 ENCOUNTER — Ambulatory Visit (INDEPENDENT_AMBULATORY_CARE_PROVIDER_SITE_OTHER): Payer: BLUE CROSS/BLUE SHIELD | Admitting: Obstetrics & Gynecology

## 2015-02-23 VITALS — BP 99/58 | HR 87 | Wt 114.0 lb

## 2015-02-23 DIAGNOSIS — O09293 Supervision of pregnancy with other poor reproductive or obstetric history, third trimester: Secondary | ICD-10-CM | POA: Diagnosis not present

## 2015-02-23 DIAGNOSIS — O2343 Unspecified infection of urinary tract in pregnancy, third trimester: Secondary | ICD-10-CM

## 2015-02-23 DIAGNOSIS — N390 Urinary tract infection, site not specified: Secondary | ICD-10-CM

## 2015-02-23 DIAGNOSIS — O0993 Supervision of high risk pregnancy, unspecified, third trimester: Secondary | ICD-10-CM

## 2015-02-23 LAB — POCT URINALYSIS DIP (DEVICE)
BILIRUBIN URINE: NEGATIVE
GLUCOSE, UA: NEGATIVE mg/dL
KETONES UR: NEGATIVE mg/dL
Nitrite: NEGATIVE
Protein, ur: NEGATIVE mg/dL
Specific Gravity, Urine: 1.015 (ref 1.005–1.030)
UROBILINOGEN UA: 4 mg/dL — AB (ref 0.0–1.0)
pH: 7 (ref 5.0–8.0)

## 2015-02-23 NOTE — Progress Notes (Signed)
OBF/NST/AFI Interpreter Elenor LegatoWier Siu for encounter

## 2015-02-23 NOTE — Patient Instructions (Signed)
Third Trimester of Pregnancy The third trimester is from week 29 through week 42, months 7 through 9. The third trimester is a time when the fetus is growing rapidly. At the end of the ninth month, the fetus is about 20 inches in length and weighs 6-10 pounds.  BODY CHANGES Your body goes through many changes during pregnancy. The changes vary from woman to woman.   Your weight will continue to increase. You can expect to gain 25-35 pounds (11-16 kg) by the end of the pregnancy.  You may begin to get stretch marks on your hips, abdomen, and breasts.  You may urinate more often because the fetus is moving lower into your pelvis and pressing on your bladder.  You may develop or continue to have heartburn as a result of your pregnancy.  You may develop constipation because certain hormones are causing the muscles that push waste through your intestines to slow down.  You may develop hemorrhoids or swollen, bulging veins (varicose veins).  You may have pelvic pain because of the weight gain and pregnancy hormones relaxing your joints between the bones in your pelvis. Backaches may result from overexertion of the muscles supporting your posture.  You may have changes in your hair. These can include thickening of your hair, rapid growth, and changes in texture. Some women also have hair loss during or after pregnancy, or hair that feels dry or thin. Your hair will most likely return to normal after your baby is born.  Your breasts will continue to grow and be tender. A yellow discharge may leak from your breasts called colostrum.  Your belly button may stick out.  You may feel short of breath because of your expanding uterus.  You may notice the fetus "dropping," or moving lower in your abdomen.  You may have a bloody mucus discharge. This usually occurs a few days to a week before labor begins.  Your cervix becomes thin and soft (effaced) near your due date. WHAT TO EXPECT AT YOUR PRENATAL  EXAMS  You will have prenatal exams every 2 weeks until week 36. Then, you will have weekly prenatal exams. During a routine prenatal visit:  You will be weighed to make sure you and the fetus are growing normally.  Your blood pressure is taken.  Your abdomen will be measured to track your baby's growth.  The fetal heartbeat will be listened to.  Any test results from the previous visit will be discussed.  You may have a cervical check near your due date to see if you have effaced. At around 36 weeks, your caregiver will check your cervix. At the same time, your caregiver will also perform a test on the secretions of the vaginal tissue. This test is to determine if a type of bacteria, Group B streptococcus, is present. Your caregiver will explain this further. Your caregiver may ask you:  What your birth plan is.  How you are feeling.  If you are feeling the baby move.  If you have had any abnormal symptoms, such as leaking fluid, bleeding, severe headaches, or abdominal cramping.  If you have any questions. Other tests or screenings that may be performed during your third trimester include:  Blood tests that check for low iron levels (anemia).  Fetal testing to check the health, activity level, and growth of the fetus. Testing is done if you have certain medical conditions or if there are problems during the pregnancy. FALSE LABOR You may feel small, irregular contractions that   eventually go away. These are called Braxton Hicks contractions, or false labor. Contractions may last for hours, days, or even weeks before true labor sets in. If contractions come at regular intervals, intensify, or become painful, it is best to be seen by your caregiver.  SIGNS OF LABOR   Menstrual-like cramps.  Contractions that are 5 minutes apart or less.  Contractions that start on the top of the uterus and spread down to the lower abdomen and back.  A sense of increased pelvic pressure or back  pain.  A watery or bloody mucus discharge that comes from the vagina. If you have any of these signs before the 37th week of pregnancy, call your caregiver right away. You need to go to the hospital to get checked immediately. HOME CARE INSTRUCTIONS   Avoid all smoking, herbs, alcohol, and unprescribed drugs. These chemicals affect the formation and growth of the baby.  Follow your caregiver's instructions regarding medicine use. There are medicines that are either safe or unsafe to take during pregnancy.  Exercise only as directed by your caregiver. Experiencing uterine cramps is a good sign to stop exercising.  Continue to eat regular, healthy meals.  Wear a good support bra for breast tenderness.  Do not use hot tubs, steam rooms, or saunas.  Wear your seat belt at all times when driving.  Avoid raw meat, uncooked cheese, cat litter boxes, and soil used by cats. These carry germs that can cause birth defects in the baby.  Take your prenatal vitamins.  Try taking a stool softener (if your caregiver approves) if you develop constipation. Eat more high-fiber foods, such as fresh vegetables or fruit and whole grains. Drink plenty of fluids to keep your urine clear or pale yellow.  Take warm sitz baths to soothe any pain or discomfort caused by hemorrhoids. Use hemorrhoid cream if your caregiver approves.  If you develop varicose veins, wear support hose. Elevate your feet for 15 minutes, 3-4 times a day. Limit salt in your diet.  Avoid heavy lifting, wear low heal shoes, and practice good posture.  Rest a lot with your legs elevated if you have leg cramps or low back pain.  Visit your dentist if you have not gone during your pregnancy. Use a soft toothbrush to brush your teeth and be gentle when you floss.  A sexual relationship may be continued unless your caregiver directs you otherwise.  Do not travel far distances unless it is absolutely necessary and only with the approval  of your caregiver.  Take prenatal classes to understand, practice, and ask questions about the labor and delivery.  Make a trial run to the hospital.  Pack your hospital bag.  Prepare the baby's nursery.  Continue to go to all your prenatal visits as directed by your caregiver. SEEK MEDICAL CARE IF:  You are unsure if you are in labor or if your water has broken.  You have dizziness.  You have mild pelvic cramps, pelvic pressure, or nagging pain in your abdominal area.  You have persistent nausea, vomiting, or diarrhea.  You have a bad smelling vaginal discharge.  You have pain with urination. SEEK IMMEDIATE MEDICAL CARE IF:   You have a fever.  You are leaking fluid from your vagina.  You have spotting or bleeding from your vagina.  You have severe abdominal cramping or pain.  You have rapid weight loss or gain.  You have shortness of breath with chest pain.  You notice sudden or extreme swelling   of your face, hands, ankles, feet, or legs.  You have not felt your baby move in over an hour.  You have severe headaches that do not go away with medicine.  You have vision changes. Document Released: 10/15/2001 Document Revised: 10/26/2013 Document Reviewed: 12/22/2012 ExitCare Patient Information 2015 [employer]. This information is not intended to replace advice given to you by your health care provider. Make sure you discuss any questions you have with your health care provider.  

## 2015-02-23 NOTE — Progress Notes (Signed)
NST reactive,  AFI nl. Growth US in 3 weeks.

## 2015-02-23 NOTE — Progress Notes (Signed)
Moderate leuks and moderate Hgb on Udip today - cx sent

## 2015-02-24 LAB — CULTURE, OB URINE: Colony Count: 30000

## 2015-02-27 ENCOUNTER — Ambulatory Visit (INDEPENDENT_AMBULATORY_CARE_PROVIDER_SITE_OTHER): Payer: BLUE CROSS/BLUE SHIELD | Admitting: *Deleted

## 2015-02-27 VITALS — BP 104/63 | HR 104

## 2015-02-27 DIAGNOSIS — O09293 Supervision of pregnancy with other poor reproductive or obstetric history, third trimester: Secondary | ICD-10-CM | POA: Diagnosis not present

## 2015-02-27 NOTE — Progress Notes (Signed)
NST performed today was reviewed and was found to be reactive.  Continue recommended antenatal testing and prenatal care.  

## 2015-03-02 ENCOUNTER — Ambulatory Visit (INDEPENDENT_AMBULATORY_CARE_PROVIDER_SITE_OTHER): Payer: BLUE CROSS/BLUE SHIELD | Admitting: Obstetrics & Gynecology

## 2015-03-02 VITALS — BP 101/63 | HR 90 | Wt 112.8 lb

## 2015-03-02 DIAGNOSIS — O0993 Supervision of high risk pregnancy, unspecified, third trimester: Secondary | ICD-10-CM

## 2015-03-02 DIAGNOSIS — O09293 Supervision of pregnancy with other poor reproductive or obstetric history, third trimester: Secondary | ICD-10-CM

## 2015-03-02 LAB — POCT URINALYSIS DIP (DEVICE)
Bilirubin Urine: NEGATIVE
GLUCOSE, UA: NEGATIVE mg/dL
Ketones, ur: NEGATIVE mg/dL
LEUKOCYTES UA: NEGATIVE
NITRITE: NEGATIVE
PH: 7.5 (ref 5.0–8.0)
Protein, ur: NEGATIVE mg/dL
SPECIFIC GRAVITY, URINE: 1.015 (ref 1.005–1.030)
Urobilinogen, UA: 2 mg/dL — ABNORMAL HIGH (ref 0.0–1.0)

## 2015-03-02 LAB — OB RESULTS CONSOLE GBS: STREP GROUP B AG: NEGATIVE

## 2015-03-02 LAB — OB RESULTS CONSOLE GC/CHLAMYDIA: CHLAMYDIA, DNA PROBE: NEGATIVE

## 2015-03-02 NOTE — Patient Instructions (Signed)

## 2015-03-02 NOTE — Progress Notes (Signed)
No complaints, GBS CT GC done. NST today

## 2015-03-02 NOTE — Progress Notes (Signed)
Moderate Hgb on udip 

## 2015-03-04 LAB — CULTURE, BETA STREP (GROUP B ONLY)

## 2015-03-06 ENCOUNTER — Ambulatory Visit (INDEPENDENT_AMBULATORY_CARE_PROVIDER_SITE_OTHER): Payer: BLUE CROSS/BLUE SHIELD | Admitting: *Deleted

## 2015-03-06 VITALS — BP 97/58 | HR 111

## 2015-03-06 DIAGNOSIS — O09293 Supervision of pregnancy with other poor reproductive or obstetric history, third trimester: Secondary | ICD-10-CM | POA: Diagnosis not present

## 2015-03-06 LAB — GC/CHLAMYDIA PROBE AMP
CT Probe RNA: NEGATIVE
GC Probe RNA: NEGATIVE

## 2015-03-06 NOTE — Progress Notes (Signed)
Interpreter - "Danielle Hoover" present for encounter. Pt denies SOB and states that she has not had SOB since 4/18.  Cardiology referral appt changed to 5/12 @ 1130. Waiting for call back from office to schedule 2D echocardiogram.

## 2015-03-09 ENCOUNTER — Ambulatory Visit (INDEPENDENT_AMBULATORY_CARE_PROVIDER_SITE_OTHER): Payer: BLUE CROSS/BLUE SHIELD | Admitting: Obstetrics & Gynecology

## 2015-03-09 VITALS — BP 98/67 | HR 89 | Wt 113.7 lb

## 2015-03-09 DIAGNOSIS — O09293 Supervision of pregnancy with other poor reproductive or obstetric history, third trimester: Secondary | ICD-10-CM | POA: Diagnosis not present

## 2015-03-09 LAB — POCT URINALYSIS DIP (DEVICE)
BILIRUBIN URINE: NEGATIVE
Glucose, UA: NEGATIVE mg/dL
KETONES UR: NEGATIVE mg/dL
NITRITE: NEGATIVE
Protein, ur: NEGATIVE mg/dL
SPECIFIC GRAVITY, URINE: 1.015 (ref 1.005–1.030)
pH: 6.5 (ref 5.0–8.0)

## 2015-03-09 LAB — US OB LIMITED

## 2015-03-09 NOTE — Patient Instructions (Signed)
Third Trimester of Pregnancy The third trimester is from week 29 through week 42, months 7 through 9. The third trimester is a time when the fetus is growing rapidly. At the end of the ninth month, the fetus is about 20 inches in length and weighs 6-10 pounds.  BODY CHANGES Your body goes through many changes during pregnancy. The changes vary from woman to woman.   Your weight will continue to increase. You can expect to gain 25-35 pounds (11-16 kg) by the end of the pregnancy.  You may begin to get stretch marks on your hips, abdomen, and breasts.  You may urinate more often because the fetus is moving lower into your pelvis and pressing on your bladder.  You may develop or continue to have heartburn as a result of your pregnancy.  You may develop constipation because certain hormones are causing the muscles that push waste through your intestines to slow down.  You may develop hemorrhoids or swollen, bulging veins (varicose veins).  You may have pelvic pain because of the weight gain and pregnancy hormones relaxing your joints between the bones in your pelvis. Backaches may result from overexertion of the muscles supporting your posture.  You may have changes in your hair. These can include thickening of your hair, rapid growth, and changes in texture. Some women also have hair loss during or after pregnancy, or hair that feels dry or thin. Your hair will most likely return to normal after your baby is born.  Your breasts will continue to grow and be tender. A yellow discharge may leak from your breasts called colostrum.  Your belly button may stick out.  You may feel short of breath because of your expanding uterus.  You may notice the fetus "dropping," or moving lower in your abdomen.  You may have a bloody mucus discharge. This usually occurs a few days to a week before labor begins.  Your cervix becomes thin and soft (effaced) near your due date. WHAT TO EXPECT AT YOUR PRENATAL  EXAMS  You will have prenatal exams every 2 weeks until week 36. Then, you will have weekly prenatal exams. During a routine prenatal visit:  You will be weighed to make sure you and the fetus are growing normally.  Your blood pressure is taken.  Your abdomen will be measured to track your baby's growth.  The fetal heartbeat will be listened to.  Any test results from the previous visit will be discussed.  You may have a cervical check near your due date to see if you have effaced. At around 36 weeks, your caregiver will check your cervix. At the same time, your caregiver will also perform a test on the secretions of the vaginal tissue. This test is to determine if a type of bacteria, Group B streptococcus, is present. Your caregiver will explain this further. Your caregiver may ask you:  What your birth plan is.  How you are feeling.  If you are feeling the baby move.  If you have had any abnormal symptoms, such as leaking fluid, bleeding, severe headaches, or abdominal cramping.  If you have any questions. Other tests or screenings that may be performed during your third trimester include:  Blood tests that check for low iron levels (anemia).  Fetal testing to check the health, activity level, and growth of the fetus. Testing is done if you have certain medical conditions or if there are problems during the pregnancy. FALSE LABOR You may feel small, irregular contractions that   eventually go away. These are called Braxton Hicks contractions, or false labor. Contractions may last for hours, days, or even weeks before true labor sets in. If contractions come at regular intervals, intensify, or become painful, it is best to be seen by your caregiver.  SIGNS OF LABOR   Menstrual-like cramps.  Contractions that are 5 minutes apart or less.  Contractions that start on the top of the uterus and spread down to the lower abdomen and back.  A sense of increased pelvic pressure or back  pain.  A watery or bloody mucus discharge that comes from the vagina. If you have any of these signs before the 37th week of pregnancy, call your caregiver right away. You need to go to the hospital to get checked immediately. HOME CARE INSTRUCTIONS   Avoid all smoking, herbs, alcohol, and unprescribed drugs. These chemicals affect the formation and growth of the baby.  Follow your caregiver's instructions regarding medicine use. There are medicines that are either safe or unsafe to take during pregnancy.  Exercise only as directed by your caregiver. Experiencing uterine cramps is a good sign to stop exercising.  Continue to eat regular, healthy meals.  Wear a good support bra for breast tenderness.  Do not use hot tubs, steam rooms, or saunas.  Wear your seat belt at all times when driving.  Avoid raw meat, uncooked cheese, cat litter boxes, and soil used by cats. These carry germs that can cause birth defects in the baby.  Take your prenatal vitamins.  Try taking a stool softener (if your caregiver approves) if you develop constipation. Eat more high-fiber foods, such as fresh vegetables or fruit and whole grains. Drink plenty of fluids to keep your urine clear or pale yellow.  Take warm sitz baths to soothe any pain or discomfort caused by hemorrhoids. Use hemorrhoid cream if your caregiver approves.  If you develop varicose veins, wear support hose. Elevate your feet for 15 minutes, 3-4 times a day. Limit salt in your diet.  Avoid heavy lifting, wear low heal shoes, and practice good posture.  Rest a lot with your legs elevated if you have leg cramps or low back pain.  Visit your dentist if you have not gone during your pregnancy. Use a soft toothbrush to brush your teeth and be gentle when you floss.  A sexual relationship may be continued unless your caregiver directs you otherwise.  Do not travel far distances unless it is absolutely necessary and only with the approval  of your caregiver.  Take prenatal classes to understand, practice, and ask questions about the labor and delivery.  Make a trial run to the hospital.  Pack your hospital bag.  Prepare the baby's nursery.  Continue to go to all your prenatal visits as directed by your caregiver. SEEK MEDICAL CARE IF:  You are unsure if you are in labor or if your water has broken.  You have dizziness.  You have mild pelvic cramps, pelvic pressure, or nagging pain in your abdominal area.  You have persistent nausea, vomiting, or diarrhea.  You have a bad smelling vaginal discharge.  You have pain with urination. SEEK IMMEDIATE MEDICAL CARE IF:   You have a fever.  You are leaking fluid from your vagina.  You have spotting or bleeding from your vagina.  You have severe abdominal cramping or pain.  You have rapid weight loss or gain.  You have shortness of breath with chest pain.  You notice sudden or extreme swelling   of your face, hands, ankles, feet, or legs.  You have not felt your baby move in over an hour.  You have severe headaches that do not go away with medicine.  You have vision changes. Document Released: 10/15/2001 Document Revised: 10/26/2013 Document Reviewed: 12/22/2012 ExitCare Patient Information 2015 [employer]. This information is not intended to replace advice given to you by your health care provider. Make sure you discuss any questions you have with your health care provider.  

## 2015-03-09 NOTE — Progress Notes (Signed)
NST reactive, good FM IOL scheduled 03/21/15 @ 0730   Diane Day RNC

## 2015-03-09 NOTE — Progress Notes (Signed)
Hgb; Moderate, Leukocytes: small

## 2015-03-13 ENCOUNTER — Ambulatory Visit (INDEPENDENT_AMBULATORY_CARE_PROVIDER_SITE_OTHER): Payer: BLUE CROSS/BLUE SHIELD | Admitting: *Deleted

## 2015-03-13 VITALS — BP 109/75 | HR 106

## 2015-03-13 DIAGNOSIS — O09293 Supervision of pregnancy with other poor reproductive or obstetric history, third trimester: Secondary | ICD-10-CM | POA: Diagnosis not present

## 2015-03-14 NOTE — Progress Notes (Signed)
03/13/15 NST reviewed and reactive 

## 2015-03-15 ENCOUNTER — Encounter (HOSPITAL_COMMUNITY): Payer: Self-pay | Admitting: *Deleted

## 2015-03-15 ENCOUNTER — Inpatient Hospital Stay (EMERGENCY_DEPARTMENT_HOSPITAL)
Admission: AD | Admit: 2015-03-15 | Discharge: 2015-03-15 | Disposition: A | Discharge status: Home / Self Care | Payer: BLUE CROSS/BLUE SHIELD | Source: Ambulatory Visit | Attending: Obstetrics & Gynecology | Admitting: Obstetrics & Gynecology

## 2015-03-15 DIAGNOSIS — O9989 Other specified diseases and conditions complicating pregnancy, childbirth and the puerperium: Secondary | ICD-10-CM

## 2015-03-15 DIAGNOSIS — R109 Unspecified abdominal pain: Secondary | ICD-10-CM

## 2015-03-15 DIAGNOSIS — Z3483 Encounter for supervision of other normal pregnancy, third trimester: Secondary | ICD-10-CM | POA: Diagnosis not present

## 2015-03-15 DIAGNOSIS — O26899 Other specified pregnancy related conditions, unspecified trimester: Secondary | ICD-10-CM

## 2015-03-15 DIAGNOSIS — Z3A38 38 weeks gestation of pregnancy: Secondary | ICD-10-CM | POA: Diagnosis present

## 2015-03-15 LAB — URINALYSIS, ROUTINE W REFLEX MICROSCOPIC
BILIRUBIN URINE: NEGATIVE
GLUCOSE, UA: NEGATIVE mg/dL
KETONES UR: NEGATIVE mg/dL
LEUKOCYTES UA: NEGATIVE
Nitrite: NEGATIVE
PH: 7 (ref 5.0–8.0)
PROTEIN: NEGATIVE mg/dL
Specific Gravity, Urine: <1.005 — ABNORMAL LOW (ref 1.005–1.030)
Urobilinogen, UA: 1 mg/dL (ref 0.0–1.0)

## 2015-03-15 LAB — URINE MICROSCOPIC-ADD ON

## 2015-03-15 MED ORDER — ZOLPIDEM TARTRATE 5 MG PO TABS
5.0000 mg | ORAL_TABLET | Freq: Once | ORAL | Status: AC
  Administered 2015-03-15: 5 mg via ORAL
  Filled 2015-03-15: qty 1

## 2015-03-15 NOTE — Discharge Instructions (Signed)
Abdominal Pain During Pregnancy °Belly (abdominal) pain is common during pregnancy. Most of the time, it is not a serious problem. Other times, it can be a sign that something is wrong with the pregnancy. Always tell your doctor if you have belly pain. °HOME CARE °Monitor your belly pain for any changes. The following actions may help you feel better: °· Do not have sex (intercourse) or put anything in your vagina until you feel better. °· Rest until your pain stops. °· Drink clear fluids if you feel sick to your stomach (nauseous). Do not eat solid food until you feel better. °· Only take medicine as told by your doctor. °· Keep all doctor visits as told. °GET HELP RIGHT AWAY IF:  °· You are bleeding, leaking fluid, or pieces of tissue come out of your vagina. °· You have more pain or cramping. °· You keep throwing up (vomiting). °· You have pain when you pee (urinate) or have blood in your pee. °· You have a fever. °· You do not feel your baby moving as much. °· You feel very weak or feel like passing out. °· You have trouble breathing, with or without belly pain. °· You have a very bad headache and belly pain. °· You have fluid leaking from your vagina and belly pain. °· You keep having watery poop (diarrhea). °· Your belly pain does not go away after resting, or the pain gets worse. °MAKE SURE YOU:  °· Understand these instructions. °· Will watch your condition. °· Will get help right away if you are not doing well or get worse. °Document Released: 10/09/2009 Document Revised: 06/23/2013 Document Reviewed: 05/20/2013 °[employer]® Patient Information ©2015 [employer]. This information is not intended to replace advice given to you by your health care provider. Make sure you discuss any questions you have with your health care provider. ° °

## 2015-03-15 NOTE — MAU Provider Note (Signed)
History     CSN: [id number]  Arrival date and time: 03/15/15 2116   None     Chief Complaint  Patient presents with  . Urinary Tract Infection  . Abdominal Pain   HPI Danielle Hoover is a 34yo [id number] @ 38.1wks by LMP and confirmed by U/S who presents for eval of low back pain and seeing sm amts of blood on tissue w/ wiping. States she has had irreg ctx and low back pain x past few days; denies leaking of fluid or bright red bldg. No H/A, N/V/D. Reports + FM. [facility] and has been remarkable for 1) hx preeclampsia and IUFD w/ that preg 2) +chlam this preg; TOC negative 3) language barrier 4) heart murmur/SOB this preg. OB records rev'd.  OB History    Gravida Para Term Preterm AB TAB SAB Ectopic Multiple Living   4 3 2 1      2       Past Medical History  Diagnosis Date  . Anemia     during pregnancy 2006  . Headache   . Asthma   . Hypertension     Past Surgical History  Procedure Laterality Date  . No past surgeries      Family History  Problem Relation Age of Onset  . Alcohol abuse Neg Hx   . Arthritis Neg Hx   . Asthma Neg Hx   . Birth defects Neg Hx   . Cancer Neg Hx   . COPD Neg Hx   . Depression Neg Hx   . Diabetes Neg Hx   . Drug abuse Neg Hx   . Early death Neg Hx   . Hearing loss Neg Hx   . Heart disease Neg Hx   . Hyperlipidemia Neg Hx   . Hypertension Neg Hx   . Kidney disease Neg Hx   . Learning disabilities Neg Hx   . Mental illness Neg Hx   . Mental retardation Neg Hx   . Miscarriages / Stillbirths Neg Hx   . Stroke Neg Hx   . Vision loss Neg Hx   . Varicose Veins Neg Hx     History  Substance Use Topics  . Smoking status: Never Smoker   . Smokeless tobacco: Never Used  . Alcohol Use: No    Allergies: No Known Allergies  Prescriptions prior to admission  Medication Sig Dispense Refill Last Dose  . aspirin 81 MG chewable tablet Chew 1 tablet (81 mg total) by mouth daily. 60 tablet 3 03/15/2015 at Unknown time   . Prenatal Vit-Fe Fumarate-FA (PRENATAL MULTIVITAMIN) TABS tablet Take 1 tablet by mouth daily at 12 noon.   03/14/2015 at Unknown time    ROS - no other pertinent s/s other than what is in HPI  Physical Exam   Blood pressure 121/75, pulse 103, temperature 98.2 F (36.8 C), temperature source Oral, resp. rate 16, height 4\' 9"  (1.448 m), weight 53.524 kg (118 lb), last menstrual period 06/21/2014, SpO2 99 %.  Physical Exam  Constitutional: She is oriented to person, place, and time. She appears well-developed.  HENT:  Head: Normocephalic.  Neck: Normal range of motion.  Cardiovascular: Normal rate.   Respiratory: Effort normal.  GI:  EFM 130s, +accels, no decels Toco: irreg ctx q 4-10+ mins with mostly irritability  Genitourinary: Vagina normal.  Cx 3/60/-2; sm bld-tinged mucous on glove No CVAT bilat  Musculoskeletal: Normal range of motion.  Neurological: She is alert and oriented to  person, place, and time.  Skin: Skin is warm and dry.  Psychiatric: She has a normal mood and affect. Her behavior is normal. Thought content normal.   Urinalysis    Component Value Date/Time   COLORURINE YELLOW 03/15/2015 2120   APPEARANCEUR CLEAR 03/15/2015 2120   LABSPEC <1.005* 03/15/2015 2120   PHURINE 7.0 03/15/2015 2120   GLUCOSEU NEGATIVE 03/15/2015 2120   HGBUR MODERATE* 03/15/2015 2120   BILIRUBINUR NEGATIVE 03/15/2015 2120   KETONESUR NEGATIVE 03/15/2015 2120   PROTEINUR NEGATIVE 03/15/2015 2120   UROBILINOGEN 1.0 03/15/2015 2120   NITRITE NEGATIVE 03/15/2015 2120   LEUKOCYTESUR NEGATIVE 03/15/2015 2120      MAU Course  Procedures  MDM NST read Cx exam Urinalysis  Assessment and Plan  IUP@38 .1wks Abd pain/probable latent labor  I feel that the hematuria is actually from vaginal bld tinged mucous- doubtful for UTI D/C home with labor/bldg/ROM precautions [facility] Given Ambien 5mg  prior to d/c home  Cam HaiSHAW, KIMBERLY CNM 03/15/2015, 10:50 PM

## 2015-03-15 NOTE — MAU Note (Signed)
Pt states pain in her lower abd and lower back for 3 days, states she has some blood in her urine and pain with urination.

## 2015-03-15 NOTE — MAU Note (Signed)
Pt presented with pain upon urination and frequency. Pt states that baby is active and denies LOF.

## 2015-03-16 ENCOUNTER — Other Ambulatory Visit (HOSPITAL_COMMUNITY): Payer: BLUE CROSS/BLUE SHIELD

## 2015-03-16 ENCOUNTER — Inpatient Hospital Stay (HOSPITAL_COMMUNITY): Payer: BLUE CROSS/BLUE SHIELD | Admitting: Anesthesiology

## 2015-03-16 ENCOUNTER — Inpatient Hospital Stay (HOSPITAL_COMMUNITY)
Admission: AD | Admit: 2015-03-16 | Discharge: 2015-03-18 | DRG: 775 | Disposition: A | Discharge status: Home / Self Care | Payer: BLUE CROSS/BLUE SHIELD | Source: Ambulatory Visit | Attending: Obstetrics & Gynecology | Admitting: Obstetrics & Gynecology

## 2015-03-16 ENCOUNTER — Encounter (HOSPITAL_COMMUNITY): Payer: Self-pay

## 2015-03-16 ENCOUNTER — Other Ambulatory Visit: Payer: BLUE CROSS/BLUE SHIELD

## 2015-03-16 ENCOUNTER — Ambulatory Visit: Payer: BLUE CROSS/BLUE SHIELD | Admitting: Cardiovascular Disease

## 2015-03-16 ENCOUNTER — Ambulatory Visit (HOSPITAL_COMMUNITY): Payer: BLUE CROSS/BLUE SHIELD

## 2015-03-16 DIAGNOSIS — Z3483 Encounter for supervision of other normal pregnancy, third trimester: Secondary | ICD-10-CM | POA: Diagnosis present

## 2015-03-16 DIAGNOSIS — Z3A38 38 weeks gestation of pregnancy: Secondary | ICD-10-CM | POA: Diagnosis present

## 2015-03-16 LAB — CBC
HCT: 30.8 % — ABNORMAL LOW (ref 36.0–46.0)
Hemoglobin: 10.1 g/dL — ABNORMAL LOW (ref 12.0–15.0)
MCH: 24.8 pg — ABNORMAL LOW (ref 26.0–34.0)
MCHC: 32.8 g/dL (ref 30.0–36.0)
MCV: 75.7 fL — ABNORMAL LOW (ref 78.0–100.0)
PLATELETS: 175 10*3/uL (ref 150–400)
RBC: 4.07 MIL/uL (ref 3.87–5.11)
RDW: 14.1 % (ref 11.5–15.5)
WBC: 13.1 10*3/uL — ABNORMAL HIGH (ref 4.0–10.5)

## 2015-03-16 LAB — RPR: RPR Ser Ql: NONREACTIVE

## 2015-03-16 MED ORDER — BENZOCAINE-MENTHOL 20-0.5 % EX AERO
1.0000 "application " | INHALATION_SPRAY | CUTANEOUS | Status: DC | PRN
  Administered 2015-03-16: 1 via TOPICAL
  Filled 2015-03-16 (×2): qty 56

## 2015-03-16 MED ORDER — BISACODYL 10 MG RE SUPP
10.0000 mg | Freq: Every day | RECTAL | Status: DC | PRN
Start: 2015-03-16 — End: 2015-03-18

## 2015-03-16 MED ORDER — PNEUMOCOCCAL VAC POLYVALENT 25 MCG/0.5ML IJ INJ
0.5000 mL | INJECTION | INTRAMUSCULAR | Status: DC
  Filled 2015-03-16: qty 0.5

## 2015-03-16 MED ORDER — DIBUCAINE 1 % RE OINT: 1.0000 "application " | TOPICAL_OINTMENT | RECTAL | Status: DC | PRN

## 2015-03-16 MED ORDER — ONDANSETRON HCL 4 MG PO TABS: 4.0000 mg | ORAL_TABLET | ORAL | Status: DC | PRN

## 2015-03-16 MED ORDER — ONDANSETRON HCL 4 MG/2ML IJ SOLN: 4.0000 mg | INTRAMUSCULAR | Status: DC | PRN

## 2015-03-16 MED ORDER — OXYCODONE-ACETAMINOPHEN 5-325 MG PO TABS: 2.0000 | ORAL_TABLET | ORAL | Status: DC | PRN

## 2015-03-16 MED ORDER — OXYCODONE-ACETAMINOPHEN 5-325 MG PO TABS: 1.0000 | ORAL_TABLET | ORAL | Status: DC | PRN

## 2015-03-16 MED ORDER — BUPIVACAINE HCL (PF) 0.25 % IJ SOLN
INTRAMUSCULAR | Status: DC | PRN
  Administered 2015-03-16: 5 mL
  Administered 2015-03-16 (×2): 3 mL

## 2015-03-16 MED ORDER — CITRIC ACID-SODIUM CITRATE 334-500 MG/5ML PO SOLN: 30.0000 mL | ORAL | Status: DC | PRN

## 2015-03-16 MED ORDER — WITCH HAZEL-GLYCERIN EX PADS: 1.0000 "application " | MEDICATED_PAD | CUTANEOUS | Status: DC | PRN

## 2015-03-16 MED ORDER — SODIUM CHLORIDE 0.9 % IJ SOLN: 3.0000 mL | Freq: Two times a day (BID) | INTRAMUSCULAR | Status: DC

## 2015-03-16 MED ORDER — PHENYLEPHRINE 40 MCG/ML (10ML) SYRINGE FOR IV PUSH (FOR BLOOD PRESSURE SUPPORT)
80.0000 ug | PREFILLED_SYRINGE | INTRAVENOUS | Status: DC | PRN
  Filled 2015-03-16: qty 20
  Filled 2015-03-16: qty 2

## 2015-03-16 MED ORDER — ACETAMINOPHEN 325 MG PO TABS
650.0000 mg | ORAL_TABLET | ORAL | Status: DC | PRN
Start: 2015-03-16 — End: 2015-03-18
  Administered 2015-03-16 – 2015-03-17 (×3): 650 mg via ORAL
  Filled 2015-03-16 (×3): qty 2

## 2015-03-16 MED ORDER — LANOLIN HYDROUS EX OINT: TOPICAL_OINTMENT | CUTANEOUS | Status: DC | PRN

## 2015-03-16 MED ORDER — FLEET ENEMA 7-19 GM/118ML RE ENEM: 1.0000 | ENEMA | Freq: Every day | RECTAL | Status: DC | PRN

## 2015-03-16 MED ORDER — FLEET ENEMA 7-19 GM/118ML RE ENEM: 1.0000 | ENEMA | RECTAL | Status: DC | PRN

## 2015-03-16 MED ORDER — SIMETHICONE 80 MG PO CHEW: 80.0000 mg | CHEWABLE_TABLET | ORAL | Status: DC | PRN

## 2015-03-16 MED ORDER — PRENATAL MULTIVITAMIN CH
1.0000 | ORAL_TABLET | Freq: Every day | ORAL | Status: DC
  Administered 2015-03-17: 1 via ORAL
  Filled 2015-03-16: qty 1

## 2015-03-16 MED ORDER — OXYTOCIN 40 UNITS IN LACTATED RINGERS INFUSION - SIMPLE MED: 62.5000 mL/h | INTRAVENOUS | Status: DC | PRN

## 2015-03-16 MED ORDER — LACTATED RINGERS IV SOLN
500.0000 mL | INTRAVENOUS | Status: DC | PRN
  Administered 2015-03-16: 500 mL via INTRAVENOUS

## 2015-03-16 MED ORDER — OXYTOCIN BOLUS FROM INFUSION
500.0000 mL | INTRAVENOUS | Status: DC
  Administered 2015-03-16: 500 mL via INTRAVENOUS

## 2015-03-16 MED ORDER — DIPHENHYDRAMINE HCL 25 MG PO CAPS: 25.0000 mg | ORAL_CAPSULE | Freq: Four times a day (QID) | ORAL | Status: DC | PRN

## 2015-03-16 MED ORDER — LIDOCAINE HCL (PF) 1 % IJ SOLN
30.0000 mL | INTRAMUSCULAR | Status: DC | PRN
  Filled 2015-03-16: qty 30

## 2015-03-16 MED ORDER — ACETAMINOPHEN 325 MG PO TABS: 650.0000 mg | ORAL_TABLET | ORAL | Status: DC | PRN

## 2015-03-16 MED ORDER — ONDANSETRON HCL 4 MG/2ML IJ SOLN: 4.0000 mg | Freq: Four times a day (QID) | INTRAMUSCULAR | Status: DC | PRN

## 2015-03-16 MED ORDER — SODIUM CHLORIDE 0.9 % IJ SOLN: 3.0000 mL | INTRAMUSCULAR | Status: DC | PRN

## 2015-03-16 MED ORDER — LIDOCAINE-EPINEPHRINE (PF) 2 %-1:200000 IJ SOLN
INTRAMUSCULAR | Status: DC | PRN
  Administered 2015-03-16: 3 mL

## 2015-03-16 MED ORDER — LACTATED RINGERS IV SOLN
INTRAVENOUS | Status: DC
  Administered 2015-03-16: 125 mL/h via INTRAVENOUS
  Administered 2015-03-16: 05:00:00 via INTRAVENOUS

## 2015-03-16 MED ORDER — OXYTOCIN 40 UNITS IN LACTATED RINGERS INFUSION - SIMPLE MED
62.5000 mL/h | INTRAVENOUS | Status: DC
  Filled 2015-03-16 (×2): qty 1000

## 2015-03-16 MED ORDER — SENNOSIDES-DOCUSATE SODIUM 8.6-50 MG PO TABS
2.0000 | ORAL_TABLET | ORAL | Status: DC
  Administered 2015-03-16: 2 via ORAL
  Filled 2015-03-16 (×2): qty 2

## 2015-03-16 MED ORDER — EPHEDRINE 5 MG/ML INJ
10.0000 mg | INTRAVENOUS | Status: DC | PRN
  Filled 2015-03-16: qty 2

## 2015-03-16 MED ORDER — DIPHENHYDRAMINE HCL 50 MG/ML IJ SOLN: 12.5000 mg | INTRAMUSCULAR | Status: DC | PRN

## 2015-03-16 MED ORDER — IBUPROFEN 600 MG PO TABS
600.0000 mg | ORAL_TABLET | Freq: Four times a day (QID) | ORAL | Status: DC
  Administered 2015-03-16 – 2015-03-18 (×7): 600 mg via ORAL
  Filled 2015-03-16 (×7): qty 1

## 2015-03-16 MED ORDER — SODIUM CHLORIDE 0.9 % IV SOLN: 250.0000 mL | INTRAVENOUS | Status: DC | PRN

## 2015-03-16 MED ORDER — ZOLPIDEM TARTRATE 5 MG PO TABS: 5.0000 mg | ORAL_TABLET | Freq: Every evening | ORAL | Status: DC | PRN

## 2015-03-16 MED ORDER — FENTANYL 2.5 MCG/ML BUPIVACAINE 1/10 % EPIDURAL INFUSION (WH - ANES)
14.0000 mL/h | INTRAMUSCULAR | Status: DC | PRN
  Administered 2015-03-16: 10 mL/h via EPIDURAL
  Filled 2015-03-16: qty 125

## 2015-03-16 NOTE — Progress Notes (Signed)
Danielle Hoover is a 34 y.o. [id number] at 11053w2d by LMP admitted for active labor  Subjective: Patient reports more pressure/pain in her pelvis  Objective: BP 94/55 mmHg  Pulse 111  Temp(Src) 98.3 F (36.8 C) (Oral)  Resp 20  Ht 4\' 9"  (1.448 m)  Wt 118 lb (53.524 kg)  BMI 25.53 kg/m2  SpO2 100%  LMP 06/21/2014 (Exact Date)     FHT:  FHR: 145 bpm, variability: moderate,  accelerations:  Present,  decelerations:  Absent UC:   regular, every 2-3 minutes SVE:   Dilation: 7 Effacement (%): 90 Station: 0 Exam by:: e. poore, rn  Labs: Lab Results  Component Value Date   WBC 13.1* 03/16/2015   HGB 10.1* 03/16/2015   HCT 30.8* 03/16/2015   MCV 75.7* 03/16/2015   PLT 175 03/16/2015   Assessment / Plan: Spontaneous labor, progressing normally  Labor: Progressing normally and AROM 0920 Fetal Wellbeing:  Category I Pain Control:  Epidural I/D:  n/a Anticipated MOD:  NSVD  Raliegh IpGottschalk, Jannah Guardiola M, DO 03/16/2015, 9:30 AM

## 2015-03-16 NOTE — Plan of Care (Signed)
Problem: Consults Goal: Birthing Suites Patient Information Press F2 to bring up selections list Outcome: Completed/Met Date Met:  03/16/15  Pt 37-[redacted] weeks EGA

## 2015-03-16 NOTE — MAU Note (Signed)
Contractions worsened since coming here earlier.  No leaking. Blood tinge when wipes. Baby moving well.

## 2015-03-16 NOTE — MAU Note (Signed)
Pt returns with worsening contractions.  

## 2015-03-16 NOTE — Anesthesia Procedure Notes (Signed)
Epidural Patient location during procedure: OB  Staffing Anesthesiologist: Khyli Swaim, CHRIS Performed by: anesthesiologist   Preanesthetic Checklist Completed: patient identified, surgical consent, pre-op evaluation, timeout performed, IV checked, risks and benefits discussed and monitors and equipment checked  Epidural Patient position: sitting Prep: site prepped and draped and DuraPrep Patient monitoring: heart rate, cardiac monitor, continuous pulse ox and blood pressure Approach: midline Location: L3-L4 Injection technique: LOR saline  Needle:  Needle type: Tuohy  Needle gauge: 17 G Needle length: 9 cm Needle insertion depth: 4 cm Catheter type: closed end flexible Catheter size: 19 Gauge Catheter at skin depth: 11 cm Test dose: negative and 2% lidocaine with Epi 1:200 K  Assessment Events: blood not aspirated, injection not painful, no injection resistance, negative IV test and no paresthesia  Additional Notes H+P and labs checked, risks and benefits discussed with the patient, consent obtained, procedure tolerated well and without complications.  Reason for block:procedure for pain   

## 2015-03-16 NOTE — H&P (Signed)
Danielle Hoover is a 34 y.o. female [phone] @ 38.2wks by LMP and 7wk U/S presenting for labor eval. Denies leaking; reports +bld tinged mucous. +FM. Denies H/A, N/V, RUQ pain. [facility], and has been remarkable for 1) hx preeclampsia w/ IUFD @ 32wks 2) chlam during preg; neg TOC now 3) SOB/tachy/murmur- did not have cardio appt yet 4) GBS neg  History OB History    Gravida Para Term Preterm AB TAB SAB Ectopic Multiple Living   4 3 2 1      2      Past Medical History  Diagnosis Date  . Anemia     during pregnancy 2006  . Headache   . Asthma   . Hypertension    Past Surgical History  Procedure Laterality Date  . No past surgeries     Family History: family history is negative for Alcohol abuse, Arthritis, Asthma, Birth defects, Cancer, COPD, Depression, Diabetes, Drug abuse, Early death, Hearing loss, Heart disease, Hyperlipidemia, Hypertension, Kidney disease, Learning disabilities, Mental illness, Mental retardation, Miscarriages / Stillbirths, Stroke, Vision loss, and Varicose Veins. Social History:  reports that she has never smoked. She has never used smokeless tobacco. She reports that she does not drink alcohol or use illicit drugs.   Prenatal Transfer Tool  Maternal Diabetes: No Genetic Screening: Normal Maternal Ultrasounds/Referrals: Normal Fetal Ultrasounds or other Referrals:  None Maternal Substance Abuse:  No Significant Maternal Medications:  None Significant Maternal Lab Results:  Lab values include: Group B Strep negative Other Comments:  chlam during preg; neg TOC now  ROS All other negative other than what is included in HPI  Dilation: 6 Effacement (%): 90 Station: -2 Exam by:: BenjiStanleyRN Blood pressure 117/71, pulse 97, temperature 98.1 F (36.7 C), temperature source Oral, resp. rate 22, height 4\' 9"  (1.448 m), weight 53.524 kg (118 lb), last menstrual period 06/21/2014, SpO2 100  %. Exam Physical Exam  Constitutional: She is oriented to person, place, and time. She appears well-developed.  HENT:  Head: Normocephalic.  Neck: Normal range of motion.  Cardiovascular: Normal rate.   Respiratory: Effort normal.  GI:  EFM 140s, +accels, no decels Ctx q 3-5 mins  Musculoskeletal: Normal range of motion.  Neurological: She is alert and oriented to person, place, and time.  Skin: Skin is warm and dry.  Psychiatric: She has a normal mood and affect. Her behavior is normal. Thought content normal.    Prenatal labs: ABO, Rh: AB/Positive/-- (11/30 0000) Antibody: Negative (11/30 0000) Rubella: Immune (11/30 0000) RPR: NON REAC (02/25 1008)  HBsAg: Negative (11/30 0000)  HIV: NONREACTIVE (02/25 1008)  GBS: Negative (04/28 0000)   Assessment/Plan: IUP@38 .2wks Active labor GBS neg  [facility] Anticipate SVD   Cam HaiSHAW, KIMBERLY CNM 03/16/2015, 5:21 AM

## 2015-03-17 ENCOUNTER — Encounter (HOSPITAL_COMMUNITY): Payer: Self-pay | Admitting: Advanced Practice Midwife

## 2015-03-17 NOTE — Anesthesia Postprocedure Evaluation (Signed)
  Anesthesia Post-op Note  Patient: Danielle Hoover  Procedure(s) Performed: * No procedures listed *  Patient Location: Mother/Baby  Anesthesia Type:Epidural  Level of Consciousness: awake  Airway and Oxygen Therapy: Patient Spontanous Breathing  Post-op Pain: mild  Post-op Assessment: Patient's Cardiovascular Status Stable and Respiratory Function Stable  Post-op Vital Signs: stable  Last Vitals:  Filed Vitals:   03/17/15 0640  BP: 95/56  Pulse: 96  Temp: 36.5 C  Resp: 18    Complications: No apparent anesthesia complications

## 2015-03-17 NOTE — Progress Notes (Signed)
Post Partum Day 1 Subjective: no complaints, up ad lib, voiding and tolerating PO, small lochia, plans to breastfeed, plans to bottle feed, natural family planning (NFP)  Objective: Blood pressure 95/56, pulse 96, temperature 97.7 F (36.5 C), temperature source Oral, resp. rate 18, height 4\' 9"  (1.448 m), weight 53.524 kg (118 lb), last menstrual period 06/21/2014, SpO2 100 %, unknown if currently breastfeeding.  Physical Exam:  General: alert, cooperative and no distress Lochia:normal flow Chest: CTAB Heart: RRR no m/r/g Abdomen: +BS, soft, nontender,  Uterine Fundus: firm DVT Evaluation: No evidence of DVT seen on physical exam. Extremities: no edema   Recent Labs  03/16/15 0453  HGB 10.1*  HCT 30.8*    Assessment/Plan: Plan for discharge tomorrow, Breastfeeding and Lactation consult   LOS: 1 day   CRESENZO-DISHMAN,Myleen Brailsford 03/17/2015, 7:33 AM

## 2015-03-17 NOTE — Lactation Note (Signed)
This note was copied from the chart of Danielle Marti Borsuk. Lactation Consultation Note  Patient Name: Danielle Hoover Today's Date: 03/17/2015 Reason for consult: Initial assessment Mom is experienced BF and reports this baby is nursing well. Mom declined interpreter for this visit, reports understanding. Mom is supplementing by choice "no milk yet". Reviewed tummy size and importance of colostrum.  Advised to BF with each feeding before giving any bottles. Mom reports this is what she has been doing. Advised baby should be at the breast 8-12 times or more in 24 hours. Reviewed guidelines for supplementing with BF, hand out given. Lactation brochure left for review, advised of OP services and support group. Mom denies other questions/concerns. Encouraged to call for assist as needed.   Maternal Data Has patient been taught Hand Expression?: Yes Does the patient have breastfeeding experience prior to this delivery?: Yes  Feeding Feeding Type: Breast Fed Length of feed: 15 min  LATCH Score/Interventions Latch: Grasps breast easily, tongue down, lips flanged, rhythmical sucking.  Audible Swallowing: A few with stimulation Intervention(s): Hand expression Intervention(s):  (try to keep awake, is dozing)  Type of Nipple: Everted at rest and after stimulation  Comfort (Breast/Nipple): Soft / non-tender     Hold (Positioning): No assistance needed to correctly position infant at breast.  LATCH Score: 9  Lactation Tools Discussed/Used     Consult Status Consult Status: Follow-up Date: 03/18/15 Follow-up type: In-patient    Danielle Hoover, Danielle Hoover 03/17/2015, 2:46 PM

## 2015-03-18 MED ORDER — IBUPROFEN 600 MG PO TABS: 600.0000 mg | ORAL_TABLET | Freq: Four times a day (QID) | ORAL | Status: DC | PRN

## 2015-03-18 NOTE — Lactation Note (Signed)
This note was copied from the chart of Danielle Hoover. Lactation Consultation Note  Patient Name: Danielle Hartlyn Decou Today's Date: 03/18/2015 Reason for consult: Follow-up assessment with this mom and term baby. When I walked in the room, mom was holding a sleeping baby, and her breasts were full and dripping. Mom has been formula feeding. I gave mom a hand pump, and her milk was streaming into the bottle - she expressed about 20 ml's in 2 minutes. I  Encouraged mom ot avoid formula now that her  Milk was in, explained supply and demand, and told her to pump to comfort, and only supplement with EBM, if supplementation needed. Mom denied any questions at this time.    Maternal Data    Feeding    LATCH Score/Interventions          Comfort (Breast/Nipple): Filling, red/small blisters or bruises, mild/mod discomfort  Problem noted: Filling Interventions (Filling): Hand pump        Lactation Tools Discussed/Used     Consult Status Consult Status: Complete Follow-up type: Call as needed    Alfred LevinsLee, Tracie Lindbloom Anne 03/18/2015, 11:31 AM

## 2015-03-18 NOTE — Discharge Instructions (Signed)

## 2015-03-18 NOTE — Discharge Summary (Signed)
Obstetric Discharge Summary Reason for Admission: onset of labor Prenatal Procedures: none Intrapartum Procedures: spontaneous vaginal delivery Postpartum Procedures: none Complications-Operative and Postpartum: labial lac- repaired HEMOGLOBIN  Date Value Ref Range Status  03/16/2015 10.1* 12.0 - 15.0 g/dL Final  16/10/960411/30/2015 54.012.5 g/dL Final   HCT  Date Value Ref Range Status  03/16/2015 30.8* 36.0 - 46.0 % Final  10/03/2014 38 % Final   Danielle Hoover is a 34yo [id number] who was admitted @ 38.2wks in active labor on 5/13. She progressed to SVD by noon that same day, and on PPD#2 is ready for discharge home. She is breast and bottlefeeding and plans on NFP.  Physical Exam:  General: alert, cooperative and no distress  Heart: RRR Lungs: nl effort Lochia: appropriate Uterine Fundus: firm DVT Evaluation: No evidence of DVT seen on physical exam.  Discharge Diagnoses: Term Pregnancy-delivered  Discharge Information: Date: 03/18/2015 Activity: pelvic rest Diet: routine Medications: PNV and Ibuprofen Condition: stable Instructions: refer to practice specific booklet Discharge to: home Follow-up Information    [facility]   Why:  Keep next scheduled appointment for your postpartum appointment.   Contact information:   [address] [place] [id number] [phone]      Newborn Data: Live born female  Birth Weight: 7 lb 0.4 oz (3185 g) APGAR: 8, 9  Home with mother.  Cam HaiSHAW, KIMBERLY CNM 03/18/2015, 7:48 AM

## 2015-03-19 LAB — TYPE AND SCREEN
ABO/RH(D): AB POS
ANTIBODY SCREEN: POSITIVE
DAT, IgG: NEGATIVE
UNIT DIVISION: 0
Unit division: 0

## 2015-03-20 ENCOUNTER — Encounter: Payer: Self-pay | Admitting: Obstetrics & Gynecology

## 2015-03-21 ENCOUNTER — Inpatient Hospital Stay (HOSPITAL_COMMUNITY): Admission: RE | Admit: 2015-03-21 | Payer: BLUE CROSS/BLUE SHIELD | Source: Ambulatory Visit

## 2015-03-22 NOTE — Anesthesia Preprocedure Evaluation (Signed)
Anesthesia Evaluation  Patient identified by MRN, date of birth, ID band Patient awake    Reviewed: Allergy & Precautions, NPO status , Patient's Chart, lab work & pertinent test results  History of Anesthesia Complications Negative for: history of anesthetic complications  Airway Mallampati: II  TM Distance: >3 FB Neck ROM: Full    Dental no notable dental hx. (+) Dental Advisory Given   Pulmonary asthma ,  breath sounds clear to auscultation  Pulmonary exam normal       Cardiovascular hypertension, Normal cardiovascular examRhythm:Regular Rate:Normal     Neuro/Psych  Headaches, negative psych ROS   GI/Hepatic negative GI ROS, Neg liver ROS,   Endo/Other  negative endocrine ROS  Renal/GU negative Renal ROS  negative genitourinary   Musculoskeletal negative musculoskeletal ROS (+)   Abdominal   Peds negative pediatric ROS (+)  Hematology negative hematology ROS (+)   Anesthesia Other Findings   Reproductive/Obstetrics negative OB ROS                             Anesthesia Physical Anesthesia Plan  ASA: II  Anesthesia Plan: Epidural   Post-op Pain Management:    Induction:   Airway Management Planned:   Additional Equipment:   Intra-op Plan:   Post-operative Plan:   Informed Consent: I have reviewed the patients History and Physical, chart, labs and discussed the procedure including the risks, benefits and alternatives for the proposed anesthesia with the patient or authorized representative who has indicated his/her understanding and acceptance.   Dental advisory given  Plan Discussed with: CRNA  Anesthesia Plan Comments:         Anesthesia Quick Evaluation

## 2015-03-22 NOTE — Progress Notes (Signed)
NST reactive 03/06/15 

## 2015-03-29 ENCOUNTER — Ambulatory Visit: Payer: BLUE CROSS/BLUE SHIELD | Admitting: Internal Medicine

## 2015-04-19 ENCOUNTER — Encounter: Payer: Self-pay | Admitting: Obstetrics & Gynecology

## 2015-04-20 ENCOUNTER — Encounter: Payer: Self-pay | Admitting: Obstetrics and Gynecology

## 2015-04-20 ENCOUNTER — Ambulatory Visit (INDEPENDENT_AMBULATORY_CARE_PROVIDER_SITE_OTHER): Payer: BLUE CROSS/BLUE SHIELD | Admitting: Obstetrics and Gynecology

## 2015-04-20 ENCOUNTER — Encounter: Payer: Self-pay | Admitting: General Practice

## 2015-04-20 NOTE — Progress Notes (Deleted)
Patient ID: Danielle Hoover, female   DOB: [birth year], 34 y.o.   MRN: [id number] Subjective:     Danielle Hoover is a 34 y.o. female who presents for a postpartum visit. She is 5 weeks postpartum following a spontaneous vaginal delivery. I have fully reviewed the prenatal and intrapartum course. The delivery was at 38.2 gestational weeks. Outcome: spontaneous vaginal delivery. Anesthesia: epidural. Postpartum course has been unremarkable. Baby's course has been unremarkable. Baby is feeding by breast. Bleeding no bleeding. Bowel function is normal. Bladder function is normal. Patient is sexually active. Contraception method is Depo-Provera injections. Postpartum depression screening: negative.  Patient reports headaches since discharge from hospital. Reports dizziness with headaches. Has not tried OTC relief options  Danielle Hoover used for interpreter  {Common ambulatory SmartLinks:19316}  Review of Systems {ros; complete:30496}   Objective:    BP 126/97 mmHg  Pulse 86  Temp(Src) 98.1 F (36.7 C) (Oral)  Ht 4\' 9"  (1.448 m)  Wt 99 lb 6.4 oz (45.088 kg)  BMI 21.50 kg/m2  Breastfeeding? Yes  General:  {gen appearance:16600}   Breasts:  {breast exam:1202::"inspection negative, no nipple discharge or bleeding, no masses or nodularity palpable"}  Lungs: {lung exam:16931}  Heart:  {heart exam:5510}  Abdomen: {abdomen exam:16834}   Vulva:  {labia exam:12198}  Vagina: {vagina exam:12200}  Cervix:  {cervix exam:14595}  Corpus: {uterus exam:12215}  Adnexa:  {adnexa exam:12223}  Rectal Exam: {rectal/vaginal exam:12274}        Assessment:    *** postpartum exam. Pap smear {done:10129} at today's visit.   Plan:    1. Contraception: {method:5051} 2. *** 3. Follow up in: {1-10:13787} {time; units:19136} or as needed.

## 2015-04-20 NOTE — Progress Notes (Signed)
  Subjective:     Danielle Hoover is a 34 y.o. female who presents for a postpartum visit. She is 4 weeks postpartum following a spontaneous vaginal delivery. I have fully reviewed the prenatal and intrapartum course. The delivery was at 38.2 gestational weeks. Outcome: spontaneous vaginal delivery. Anesthesia: epidural. Postpartum course has been uncomplicated. Baby's course has been uncomplicated. Baby is feeding by breast. Bleeding no bleeding. Bowel function is normal. Bladder function is normal. Patient is sexually active. Contraception method is none. Last unprotected intercourse was yesterday evening. Postpartum depression screening: negative.     Review of Systems A comprehensive review of systems was negative.   Objective:    BP 140/90 mmHg  Pulse 86  Temp(Src) 98.1 F (36.7 C) (Oral)  Ht 4\' 9"  (1.448 m)  Wt 99 lb 6.4 oz (45.088 kg)  BMI 21.50 kg/m2  Breastfeeding? Yes  General:  alert, cooperative and no distress   Breasts:  inspection negative, no nipple discharge or bleeding, no masses or nodularity palpable  Lungs: clear to auscultation bilaterally  Heart:  regularly irregular rhythm  Abdomen: soft, non-tender; bowel sounds normal; no masses,  no organomegaly   Vulva:  normal  Vagina: normal vagina, no discharge, exudate, lesion, or erythema  Cervix:  multiparous appearance  Corpus: normal size, contour, position, consistency, mobility, non-tender  Adnexa:  no mass, fullness, tenderness  Rectal Exam: Not performed.        Assessment:     Normal postpartum exam. Pap smear not done at today's visit.   Plan:    1. Contraception: Depo-Provera injections 2. Patient is medically cleared to resume all activities of daily living 3. Patient with borderline BP. Will have her follow up with PCP  3. Follow up in: November 2016 for annual exam or as needed.

## 2015-05-04 ENCOUNTER — Ambulatory Visit (INDEPENDENT_AMBULATORY_CARE_PROVIDER_SITE_OTHER): Payer: BLUE CROSS/BLUE SHIELD | Admitting: Obstetrics & Gynecology

## 2015-05-04 DIAGNOSIS — Z3042 Encounter for surveillance of injectable contraceptive: Secondary | ICD-10-CM | POA: Diagnosis not present

## 2015-05-04 LAB — POCT PREGNANCY, URINE: PREG TEST UR: NEGATIVE

## 2015-05-04 MED ORDER — MEDROXYPROGESTERONE ACETATE 150 MG/ML IM SUSP
150.0000 mg | Freq: Once | INTRAMUSCULAR | Status: AC
  Administered 2015-05-04: 150 mg via INTRAMUSCULAR

## 2015-05-05 ENCOUNTER — Encounter: Payer: Self-pay | Admitting: Obstetrics & Gynecology

## 2015-05-05 NOTE — Progress Notes (Signed)
Patient received Depo Provera 150 mg IM administered by RN.

## 2015-05-18 ENCOUNTER — Ambulatory Visit (INDEPENDENT_AMBULATORY_CARE_PROVIDER_SITE_OTHER): Payer: BLUE CROSS/BLUE SHIELD | Admitting: Adult Health

## 2015-05-18 ENCOUNTER — Encounter: Payer: Self-pay | Admitting: Adult Health

## 2015-05-18 VITALS — BP 110/80 | HR 92 | Temp 99.1°F | Ht <= 58 in | Wt 100.4 lb

## 2015-05-18 DIAGNOSIS — Z7689 Persons encountering health services in other specified circumstances: Secondary | ICD-10-CM

## 2015-05-18 DIAGNOSIS — R0989 Other specified symptoms and signs involving the circulatory and respiratory systems: Secondary | ICD-10-CM | POA: Diagnosis not present

## 2015-05-18 DIAGNOSIS — R05 Cough: Secondary | ICD-10-CM | POA: Diagnosis not present

## 2015-05-18 DIAGNOSIS — R059 Cough, unspecified: Secondary | ICD-10-CM

## 2015-05-18 DIAGNOSIS — Z7189 Other specified counseling: Secondary | ICD-10-CM | POA: Diagnosis not present

## 2015-05-18 MED ORDER — ALBUTEROL SULFATE 108 (90 BASE) MCG/ACT IN AEPB: 108.0000 ug | INHALATION_SPRAY | Freq: Two times a day (BID) | RESPIRATORY_TRACT | Status: DC | PRN

## 2015-05-18 MED ORDER — PREDNISONE 20 MG PO TABS: 20.0000 mg | ORAL_TABLET | Freq: Every day | ORAL | Status: DC

## 2015-05-18 NOTE — Progress Notes (Signed)
HPI:  Danielle Hoover is here to establish care. She is a pleasant 34 year old [place] female who presents with interpreter  Last PCP and physical:  Has the following chronic problems that require follow up and concerns today  She recently had a baby in May 2016. She is currently breast feeding and formula feeding.   HTN after pregnancy - She endorses that she did not have high blood pressure before pregnancy. After the birth she noticed that on occasion that she would have high blood pressure. During her last visit to OB her blood pressure was 140/90. She denies any headaches, dizziness, lightheadedness or syncope.   Cough - She has had a productive cough for two days. When she coughs she endorses that her chest feels tight. Denies SOB or fever. Has no pain or pressure in the face.    ROS negative for unless reported above: fevers, chills unintentional weight loss, hearing or vision loss, chest pain, palpitations, leg claudication, struggling to breath,Not feeling congested in the chest, no orthopenia, no cough,no wheezing, normal appetite, no soft tissue swelling, no hemoptysis, melena, hematochezia, hematuria, falls, loc, si, or thoughts of self harm.  Immunizations:UTD Diet:Eats healthy Exercise:Does not exercise Pap Smear: No abnormal.    Past Medical History  Diagnosis Date  . Anemia     during pregnancy 2006  . Headache   . Asthma   . Hypertension     Past Surgical History  Procedure Laterality Date  . No past surgeries      Family History  Problem Relation Age of Onset  . Alcohol abuse Neg Hx   . Arthritis Neg Hx   . Asthma Neg Hx   . Birth defects Neg Hx   . Cancer Neg Hx   . COPD Neg Hx   . Depression Neg Hx   . Diabetes Neg Hx   . Drug abuse Neg Hx   . Early death Neg Hx   . Hearing loss Neg Hx   . Heart disease Neg Hx   . Hyperlipidemia Neg Hx   . Hypertension Neg Hx   . Kidney disease Neg Hx   . Learning disabilities Neg Hx   . Mental illness Neg  Hx   . Mental retardation Neg Hx   . Miscarriages / Stillbirths Neg Hx   . Stroke Neg Hx   . Vision loss Neg Hx   . Varicose Veins Neg Hx     History   Social History  . Marital Status: Divorced    Spouse Name: N/A  . Number of Children: N/A  . Years of Education: N/A   Social History Main Topics  . Smoking status: Never Smoker   . Smokeless tobacco: Never Used  . Alcohol Use: No  . Drug Use: No  . Sexual Activity: Yes    Birth Control/ Protection: None     Comment: last week   Other Topics Concern  . None   Social History Narrative     Current outpatient prescriptions:  .  Prenatal Vit-Fe Fumarate-FA (PRENATAL MULTIVITAMIN) TABS tablet, Take 1 tablet by mouth daily at 12 noon., Disp: , Rfl:   EXAM:  Filed Vitals:   05/18/15 0823  BP: 110/80  Pulse: 92  Temp: 99.1 F (37.3 C)    Body mass index is 20.99 kg/(m^2).  GENERAL: vitals reviewed and listed above, alert, oriented, appears well hydrated and in no acute distress  HEENT: atraumatic, conjunttiva clear, no obvious abnormalities on inspection of external nose and  ears. TM's visualized. No cerumen impaction.  NECK: Neck is soft and supple without masses, no adenopathy or thyromegaly, trachea midline, no JVD. Normal range of motion.   LUNGS: clear to auscultation bilaterally, no rales or rhonchi, good air movement. Wheezing in bases that clears with cough  CV: Regular rate and rhythm, normal S1/S2, no audible murmurs, gallops, or rubs. No carotid bruit and no peripheral edema.   MS: moves all extremities without noticeable abnormality. No edema noted  Abd: soft/nontender/nondistended/normal bowel sounds   Skin: warm and dry, no rash   Extremities: No clubbing, cyanosis, or edema. Capillary refill is WNL. Pulses intact bilaterally in upper and lower extremities.   Neuro: CN II-XII intact, sensation and reflexes normal throughout, 5/5 muscle strength in bilateral upper and lower extremities. Normal  finger to nose. Normal rapid alternating movements.    PSYCH: pleasant and cooperative, no obvious depression or anxiety  ASSESSMENT AND PLAN: 1. Encounter to establish care - Follow up in one month for CPE - Follow up sooner if needed  2. Cough  - Albuterol Sulfate (PROAIR RESPICLICK) 108 (90 BASE) MCG/ACT AEPB; Inhale 108 mcg into the lungs 2 (two) times daily as needed.  Dispense: 1 each; Refill: 3 - predniSONE (DELTASONE) 20 MG tablet; Take 1 tablet (20 mg total) by mouth daily with breakfast.  Dispense: 9 tablet; Refill: 0  3. Chest congestion  - Albuterol Sulfate (PROAIR RESPICLICK) 108 (90 BASE) MCG/ACT AEPB; Inhale 108 mcg into the lungs 2 (two) times daily as needed.  Dispense: 1 each; Refill: 3 - predniSONE (DELTASONE) 20 MG tablet; Take 1 tablet (20 mg total) by mouth daily with breakfast.  Dispense: 9 tablet; Refill: 0   Discussed the following assessment and plan:  No diagnosis found. -We reviewed the PMH, PSH, FH, SH, Meds and Allergies. -We provided refills for any medications we will prescribe as needed. -We addressed current concerns per orders and patient instructions. -We have asked for records for pertinent exams, studies, vaccines and notes from previous providers. -We have advised patient to follow up per instructions below.   -Patient advised to return or notify a provider immediately if symptoms worsen or persist or new concerns arise.  There are no Patient Instructions on file for this visit.   [employer]

## 2015-05-18 NOTE — Patient Instructions (Addendum)
It was nice meeting you.   I have sent a prescription to the pharmacy for an inhaler, use this twice a day as needed. And also for prednisone, which is a steroid. Please take the prednisone as directed. You can also pick up Mucinex at the drug store, which will help with the cough and congestion.   Day 1 40 mg Day 2 40 mg Day 3 30 mg Day 4 30 mg Day 5 20 mg Day 6 10 mg Day 7 10 mg  Follow up in one month for complete physical exam.

## 2015-06-15 ENCOUNTER — Other Ambulatory Visit: Payer: BLUE CROSS/BLUE SHIELD

## 2015-06-22 ENCOUNTER — Encounter: Payer: Self-pay | Admitting: Adult Health

## 2015-06-22 ENCOUNTER — Ambulatory Visit (INDEPENDENT_AMBULATORY_CARE_PROVIDER_SITE_OTHER): Payer: BLUE CROSS/BLUE SHIELD | Admitting: Adult Health

## 2015-06-22 VITALS — BP 102/80 | Temp 98.1°F | Ht <= 58 in | Wt 102.9 lb

## 2015-06-22 DIAGNOSIS — Z0000 Encounter for general adult medical examination without abnormal findings: Secondary | ICD-10-CM | POA: Diagnosis not present

## 2015-06-22 DIAGNOSIS — M545 Low back pain, unspecified: Secondary | ICD-10-CM

## 2015-06-22 LAB — POCT URINALYSIS DIPSTICK
BILIRUBIN UA: NEGATIVE
Glucose, UA: NEGATIVE
KETONES UA: NEGATIVE
LEUKOCYTES UA: NEGATIVE
Nitrite, UA: NEGATIVE
SPEC GRAV UA: 1.02
Urobilinogen, UA: 0.2
pH, UA: 7

## 2015-06-22 LAB — LIPID PANEL
CHOL/HDL RATIO: 3
Cholesterol: 157 mg/dL (ref 0–200)
HDL: 51.5 mg/dL (ref >39.00)
LDL Cholesterol: 94 mg/dL (ref 0–99)
NONHDL: 105.76
Triglycerides: 59 mg/dL (ref 0.0–149.0)
VLDL: 11.8 mg/dL (ref 0.0–40.0)

## 2015-06-22 LAB — CBC WITH DIFFERENTIAL/PLATELET
BASOS PCT: 0.4 % (ref 0.0–3.0)
Basophils Absolute: 0 10*3/uL (ref 0.0–0.1)
EOS PCT: 2.6 % (ref 0.0–5.0)
Eosinophils Absolute: 0.2 10*3/uL (ref 0.0–0.7)
HCT: 38.5 % (ref 36.0–46.0)
HEMOGLOBIN: 12.7 g/dL (ref 12.0–15.0)
Lymphocytes Relative: 32.1 % (ref 12.0–46.0)
Lymphs Abs: 2.2 10*3/uL (ref 0.7–4.0)
MCHC: 33.1 g/dL (ref 30.0–36.0)
MCV: 72.4 fl — ABNORMAL LOW (ref 78.0–100.0)
MONO ABS: 0.3 10*3/uL (ref 0.1–1.0)
Monocytes Relative: 5.1 % (ref 3.0–12.0)
Neutro Abs: 4.1 10*3/uL (ref 1.4–7.7)
Neutrophils Relative %: 59.8 % (ref 43.0–77.0)
Platelets: 307 10*3/uL (ref 150.0–400.0)
RBC: 5.31 Mil/uL — AB (ref 3.87–5.11)
RDW: 16.6 % — ABNORMAL HIGH (ref 11.5–15.5)
WBC: 6.9 10*3/uL (ref 4.0–10.5)

## 2015-06-22 LAB — BASIC METABOLIC PANEL
BUN: 9 mg/dL (ref 6–23)
CO2: 28 mEq/L (ref 19–32)
Calcium: 9 mg/dL (ref 8.4–10.5)
Chloride: 107 mEq/L (ref 96–112)
Creatinine, Ser: 0.46 mg/dL (ref 0.40–1.20)
GFR: 166.66 mL/min (ref >60.00)
Glucose, Bld: 80 mg/dL (ref 70–99)
POTASSIUM: 4.3 meq/L (ref 3.5–5.1)
Sodium: 141 mEq/L (ref 135–145)

## 2015-06-22 LAB — HEPATIC FUNCTION PANEL
ALT: 16 U/L (ref 0–35)
AST: 17 U/L (ref 0–37)
Albumin: 3.8 g/dL (ref 3.5–5.2)
Alkaline Phosphatase: 77 U/L (ref 39–117)
BILIRUBIN TOTAL: 0.5 mg/dL (ref 0.2–1.2)
Bilirubin, Direct: 0.1 mg/dL (ref 0.0–0.3)
TOTAL PROTEIN: 6.9 g/dL (ref 6.0–8.3)

## 2015-06-22 LAB — TSH: TSH: 1.85 u[IU]/mL (ref 0.35–4.50)

## 2015-06-22 NOTE — Progress Notes (Signed)
Subjective:    Patient ID: Danielle Hoover, female    DOB: 11/12/1980, 34 y.o.   MRN: [id number]  HPI  34 year old female with  has a past medical history of Anemia; Headache; Asthma; and Hypertension. presents to the office today for her annual physical.  Interpreter present during exam    She is three months postpartum. Has seen OBGYN in June and was cleared.    Her only complaint today is that of lower back pain. This back pain is " achy" and is only happens on occasion. She denies any trauma to the area. Denies any specific time of day in which the pain is present. Has not tried any OTC medication. Denies any UTI symptoms, fevers, or radiating pain.   Review of Systems  Constitutional: Negative.   HENT: Negative.   Eyes: Negative.   Respiratory: Negative.   Cardiovascular: Negative.   Gastrointestinal: Negative.   Endocrine: Negative.   Genitourinary: Negative.   Musculoskeletal: Positive for back pain.  Skin: Negative.   Allergic/Immunologic: Negative.   Neurological: Negative.   Hematological: Negative.   Psychiatric/Behavioral: Negative.    Past Medical History  Diagnosis Date  . Anemia     during pregnancy 2006  . Headache   . Asthma   . Hypertension     Social History   Social History  . Marital Status: Divorced    Spouse Name: N/A  . Number of Children: N/A  . Years of Education: N/A   Occupational History  . Not on file.   Social History Main Topics  . Smoking status: Never Smoker   . Smokeless tobacco: Never Used  . Alcohol Use: No  . Drug Use: No  . Sexual Activity: Yes    Birth Control/ Protection: None     Comment: last week   Other Topics Concern  . Not on file   Social History Narrative   Makes socks   Not married- has a boy friend   Four children. Recent pregnancy        Past Surgical History  Procedure Laterality Date  . No past surgeries      Family History  Problem Relation Age of Onset  . Alcohol abuse Neg Hx   . Arthritis  Neg Hx   . Asthma Neg Hx   . Birth defects Neg Hx   . Cancer Neg Hx   . COPD Neg Hx   . Depression Neg Hx   . Diabetes Neg Hx   . Drug abuse Neg Hx   . Early death Neg Hx   . Hearing loss Neg Hx   . Heart disease Neg Hx   . Hyperlipidemia Neg Hx   . Hypertension Neg Hx   . Kidney disease Neg Hx   . Learning disabilities Neg Hx   . Mental illness Neg Hx   . Mental retardation Neg Hx   . Miscarriages / Stillbirths Neg Hx   . Stroke Neg Hx   . Vision loss Neg Hx   . Varicose Veins Neg Hx     No Known Allergies  Current Outpatient Prescriptions on File Prior to Visit  Medication Sig Dispense Refill  . Albuterol Sulfate (PROAIR RESPICLICK) 108 (90 BASE) MCG/ACT AEPB Inhale 108 mcg into the lungs 2 (two) times daily as needed. 1 each 3  . Prenatal Vit-Fe Fumarate-FA (PRENATAL MULTIVITAMIN) TABS tablet Take 1 tablet by mouth daily at 12 noon.     No current facility-administered medications on file prior to visit.  BP 102/80 mmHg  Temp(Src) 98.1 F (36.7 C) (Oral)  Ht  (1.473 m)  Wt 102 lb 14.4 oz (46.675 kg)  BMI 21.51 kg/m2  LMP  (LMP Unknown)  Breastfeeding? No       Objective:   Physical Exam  Constitutional: She is oriented to person, place, and time. She appears well-developed and well-nourished. No distress.  HENT:  Head: Normocephalic and atraumatic.  Right Ear: External ear normal.  Left Ear: External ear normal.  Nose: Nose normal.  Mouth/Throat: Oropharynx is clear and moist. No oropharyngeal exudate.  Eyes: Conjunctivae and EOM are normal. Pupils are equal, round, and reactive to light. Right eye exhibits no discharge. Left eye exhibits no discharge. No scleral icterus.  Neck: Normal range of motion. Neck supple. No thyromegaly present.  Cardiovascular: Normal rate, regular rhythm, normal heart sounds and intact distal pulses.  Exam reveals no gallop and no friction rub.   No murmur heard. Pulmonary/Chest: Effort normal and breath sounds normal.  No respiratory distress. She has no wheezes. She has no rales. She exhibits no tenderness.  Abdominal: Soft. Bowel sounds are normal. She exhibits no distension. There is no tenderness. There is no rebound and no guarding.  Genitourinary:  Deferred  Musculoskeletal: Normal range of motion. She exhibits tenderness (left scapular area). She exhibits no edema.  No CVA tenderness  Lymphadenopathy:    She has no cervical adenopathy.  Neurological: She is alert and oriented to person, place, and time. She has normal reflexes. No cranial nerve deficit. Coordination normal.  Skin: Skin is warm and dry. No rash noted. She is not diaphoretic. No erythema. No pallor.  Psychiatric: She has a normal mood and affect. Her behavior is normal. Judgment and thought content normal.  Nursing note and vitals reviewed.      Assessment & Plan:  1. Routine general medical examination at a health care facility - Will follow up on labs.  - Follow up in one year - Follow up sooner if needed  2. Bilateral low back pain without sciatica - Ibuprofen 400-600mg  as needed for pain - Ice or heat to affected area.  - Follow up if no improvement in the next 2-3 days.

## 2015-06-22 NOTE — Addendum Note (Signed)
Addended by: Bonnye Fava on: 06/22/2015 10:51 AM   Modules accepted: Orders

## 2015-06-22 NOTE — Patient Instructions (Signed)
I will follow up with you regarding your labs.   Please take Ibuprofen 400-600mg  ( 2-3 pills) as needed for the back pain.   Follow up in one year or sooner if needed.

## 2015-07-20 ENCOUNTER — Ambulatory Visit: Payer: BLUE CROSS/BLUE SHIELD

## 2015-07-26 ENCOUNTER — Ambulatory Visit (INDEPENDENT_AMBULATORY_CARE_PROVIDER_SITE_OTHER): Payer: BLUE CROSS/BLUE SHIELD | Admitting: *Deleted

## 2015-07-26 VITALS — BP 126/79 | HR 68 | Temp 98.4°F | Wt 109.8 lb

## 2015-07-26 DIAGNOSIS — Z3042 Encounter for surveillance of injectable contraceptive: Secondary | ICD-10-CM | POA: Diagnosis not present

## 2015-07-26 MED ORDER — MEDROXYPROGESTERONE ACETATE 150 MG/ML IM SUSP
150.0000 mg | Freq: Once | INTRAMUSCULAR | Status: AC
  Administered 2015-07-26: 150 mg via INTRAMUSCULAR

## 2015-09-11 ENCOUNTER — Encounter: Payer: Self-pay | Admitting: *Deleted

## 2015-10-11 ENCOUNTER — Ambulatory Visit (INDEPENDENT_AMBULATORY_CARE_PROVIDER_SITE_OTHER): Payer: BLUE CROSS/BLUE SHIELD | Admitting: *Deleted

## 2015-10-11 VITALS — BP 121/69 | HR 69 | Temp 98.8°F | Wt 112.1 lb

## 2015-10-11 DIAGNOSIS — Z3042 Encounter for surveillance of injectable contraceptive: Secondary | ICD-10-CM | POA: Diagnosis not present

## 2015-10-11 MED ORDER — MEDROXYPROGESTERONE ACETATE 150 MG/ML IM SUSP
150.0000 mg | Freq: Once | INTRAMUSCULAR | Status: AC
  Administered 2015-10-11: 150 mg via INTRAMUSCULAR

## 2015-10-11 MED ORDER — MEDROXYPROGESTERONE ACETATE 400 MG/ML IM SUSP: 150.0000 mg | Freq: Once | INTRAMUSCULAR | Status: DC

## 2015-10-11 NOTE — Addendum Note (Signed)
Addended by: Garret ReddishBARNES, Johnthomas Lader M on: 10/11/2015 11:25 AM   Modules accepted: Orders

## 2015-12-27 ENCOUNTER — Ambulatory Visit (INDEPENDENT_AMBULATORY_CARE_PROVIDER_SITE_OTHER): Payer: BLUE CROSS/BLUE SHIELD | Admitting: General Practice

## 2015-12-27 VITALS — BP 114/67 | HR 69 | Temp 98.1°F | Ht <= 58 in | Wt 114.0 lb

## 2015-12-27 DIAGNOSIS — Z3042 Encounter for surveillance of injectable contraceptive: Secondary | ICD-10-CM

## 2015-12-27 MED ORDER — MEDROXYPROGESTERONE ACETATE 150 MG/ML IM SUSP
150.0000 mg | Freq: Once | INTRAMUSCULAR | Status: AC
  Administered 2015-12-27: 150 mg via INTRAMUSCULAR

## 2016-03-13 ENCOUNTER — Ambulatory Visit (INDEPENDENT_AMBULATORY_CARE_PROVIDER_SITE_OTHER): Payer: BLUE CROSS/BLUE SHIELD | Admitting: General Practice

## 2016-03-13 VITALS — BP 111/52 | HR 72 | Ht <= 58 in | Wt 119.0 lb

## 2016-03-13 DIAGNOSIS — Z3042 Encounter for surveillance of injectable contraceptive: Secondary | ICD-10-CM | POA: Diagnosis not present

## 2016-03-13 MED ORDER — MEDROXYPROGESTERONE ACETATE 150 MG/ML IM SUSP
150.0000 mg | Freq: Once | INTRAMUSCULAR | Status: AC
  Administered 2016-03-13: 150 mg via INTRAMUSCULAR

## 2016-05-16 DIAGNOSIS — R1110 Vomiting, unspecified: Secondary | ICD-10-CM | POA: Diagnosis not present

## 2016-05-16 DIAGNOSIS — R509 Fever, unspecified: Secondary | ICD-10-CM | POA: Diagnosis not present

## 2016-05-16 DIAGNOSIS — B349 Viral infection, unspecified: Secondary | ICD-10-CM | POA: Diagnosis not present

## 2016-06-10 ENCOUNTER — Ambulatory Visit (INDEPENDENT_AMBULATORY_CARE_PROVIDER_SITE_OTHER): Payer: BLUE CROSS/BLUE SHIELD

## 2016-06-10 VITALS — BP 122/85 | HR 83 | Wt 113.5 lb

## 2016-06-10 DIAGNOSIS — Z3042 Encounter for surveillance of injectable contraceptive: Secondary | ICD-10-CM

## 2016-06-10 MED ORDER — MEDROXYPROGESTERONE ACETATE 150 MG/ML IM SUSP: 150.0000 mg | INTRAMUSCULAR | 0 refills | Status: DC

## 2016-06-10 MED ORDER — MEDROXYPROGESTERONE ACETATE 150 MG/ML IM SUSP
150.0000 mg | Freq: Once | INTRAMUSCULAR | Status: AC
  Administered 2016-06-10: 150 mg via INTRAMUSCULAR

## 2016-06-10 NOTE — Progress Notes (Signed)
Patient presented to clinic today for a depo-provera. Patient tolerated well and will follow up in three months.

## 2016-08-26 ENCOUNTER — Ambulatory Visit (INDEPENDENT_AMBULATORY_CARE_PROVIDER_SITE_OTHER): Payer: BLUE CROSS/BLUE SHIELD | Admitting: *Deleted

## 2016-08-26 DIAGNOSIS — Z3042 Encounter for surveillance of injectable contraceptive: Secondary | ICD-10-CM

## 2016-08-26 MED ORDER — MEDROXYPROGESTERONE ACETATE 150 MG/ML IM SUSP: 150.0000 mg | INTRAMUSCULAR | 0 refills | Status: DC

## 2016-08-26 MED ORDER — MEDROXYPROGESTERONE ACETATE 150 MG/ML IM SUSP
150.0000 mg | INTRAMUSCULAR | Status: DC
  Administered 2016-08-26 – 2018-05-22 (×7): 150 mg via INTRAMUSCULAR

## 2016-11-11 ENCOUNTER — Ambulatory Visit: Payer: BLUE CROSS/BLUE SHIELD

## 2016-11-12 ENCOUNTER — Ambulatory Visit: Payer: BLUE CROSS/BLUE SHIELD

## 2016-11-13 ENCOUNTER — Ambulatory Visit (INDEPENDENT_AMBULATORY_CARE_PROVIDER_SITE_OTHER): Payer: BLUE CROSS/BLUE SHIELD | Admitting: *Deleted

## 2016-11-13 DIAGNOSIS — Z3042 Encounter for surveillance of injectable contraceptive: Secondary | ICD-10-CM | POA: Diagnosis not present

## 2017-01-27 ENCOUNTER — Ambulatory Visit (INDEPENDENT_AMBULATORY_CARE_PROVIDER_SITE_OTHER): Payer: BLUE CROSS/BLUE SHIELD | Admitting: *Deleted

## 2017-01-27 VITALS — BP 114/79 | HR 87

## 2017-01-27 DIAGNOSIS — Z3042 Encounter for surveillance of injectable contraceptive: Secondary | ICD-10-CM | POA: Diagnosis not present

## 2017-01-27 NOTE — Progress Notes (Signed)
Call [place] interpreter and no [place] Spire[place]arai interpreter unavailable.Patient was able to speak some english.

## 2017-01-29 ENCOUNTER — Ambulatory Visit: Payer: BLUE CROSS/BLUE SHIELD

## 2017-04-14 ENCOUNTER — Ambulatory Visit: Payer: BLUE CROSS/BLUE SHIELD

## 2017-04-28 ENCOUNTER — Ambulatory Visit (INDEPENDENT_AMBULATORY_CARE_PROVIDER_SITE_OTHER): Payer: BLUE CROSS/BLUE SHIELD

## 2017-04-28 VITALS — BP 147/82 | HR 55

## 2017-04-28 DIAGNOSIS — Z3042 Encounter for surveillance of injectable contraceptive: Secondary | ICD-10-CM

## 2017-07-14 ENCOUNTER — Ambulatory Visit (INDEPENDENT_AMBULATORY_CARE_PROVIDER_SITE_OTHER): Payer: BLUE CROSS/BLUE SHIELD | Admitting: Lab

## 2017-07-14 VITALS — BP 127/85 | HR 87 | Wt 131.4 lb

## 2017-07-14 DIAGNOSIS — Z3042 Encounter for surveillance of injectable contraceptive: Secondary | ICD-10-CM

## 2017-07-14 DIAGNOSIS — Z309 Encounter for contraceptive management, unspecified: Secondary | ICD-10-CM

## 2017-07-14 NOTE — Progress Notes (Signed)
Patient here today for Depo Injection.

## 2017-09-29 ENCOUNTER — Ambulatory Visit (INDEPENDENT_AMBULATORY_CARE_PROVIDER_SITE_OTHER): Payer: BLUE CROSS/BLUE SHIELD | Admitting: General Practice

## 2017-09-29 DIAGNOSIS — Z3042 Encounter for surveillance of injectable contraceptive: Secondary | ICD-10-CM

## 2017-09-29 NOTE — Progress Notes (Signed)
Discussed with patient that she needs to come in for annual exam prior to next injection as she hasn't seen a doctor here since 2016. Patient verbalized understanding.

## 2017-10-03 NOTE — Progress Notes (Addendum)
I have reviewed patient's chart and RN's note and I am in agreement.   Raynelle FanningJulie P. Degele, MD

## 2017-10-14 ENCOUNTER — Ambulatory Visit (INDEPENDENT_AMBULATORY_CARE_PROVIDER_SITE_OTHER): Payer: BLUE CROSS/BLUE SHIELD | Admitting: Advanced Practice Midwife

## 2017-10-14 VITALS — BP 124/83 | HR 84 | Wt 133.3 lb

## 2017-10-14 DIAGNOSIS — Z1151 Encounter for screening for human papillomavirus (HPV): Secondary | ICD-10-CM | POA: Diagnosis not present

## 2017-10-14 DIAGNOSIS — R87619 Unspecified abnormal cytological findings in specimens from cervix uteri: Secondary | ICD-10-CM | POA: Diagnosis not present

## 2017-10-14 DIAGNOSIS — Z01419 Encounter for gynecological examination (general) (routine) without abnormal findings: Secondary | ICD-10-CM | POA: Diagnosis not present

## 2017-10-14 DIAGNOSIS — Z124 Encounter for screening for malignant neoplasm of cervix: Secondary | ICD-10-CM

## 2017-10-14 DIAGNOSIS — N941 Unspecified dyspareunia: Secondary | ICD-10-CM

## 2017-10-14 DIAGNOSIS — Z113 Encounter for screening for infections with a predominantly sexual mode of transmission: Secondary | ICD-10-CM | POA: Diagnosis not present

## 2017-10-14 NOTE — Patient Instructions (Signed)
Dyspareunia, Female Dyspareunia is pain that is associated with sexual activity. This can affect any part of the genitals or lower abdomen, and there are many possible causes. This condition ranges from mild to severe. Depending on the cause, dyspareunia may get better with treatment, or it may return (recur) over time. What are the causes? The cause of this condition is not always known. Possible causes include:  Cancer.  Psychological factors, such as depression, anxiety, or previous traumatic experiences.  Severe pain and tenderness of the skin around the vagina (vulva) when it is touched (vulvar vestibulitis syndrome).  Infection of the pelvis or the vulva.  Infection of the vagina.  Painful, involuntary tightening (contraction) of the vaginal muscles when anything is put inside the vagina (vaginismus).  Allergic reaction.  Ovarian cysts.  Solid growths of tissue (tumors) in the ovaries or the uterus.  Scar tissue in the ovaries, vagina, or pelvis.  Vaginal dryness.  Thinning of the tissue (atrophy) of the vulva and vagina.  Skin conditions that affect the vulva (vulvar dermatoses), such as lichen sclerosus or lichen planus.  Endometriosis.  Tubal pregnancy.  A tilted uterus.  Uterine prolapse.  Adhesions in the vagina.  Bladder problems.  Intestinal problems.  Certain medicines.  Medical conditions such as diabetes, arthritis, or thyroid disease.  What increases the risk? The following factors may make you more likely to develop this condition:  Having experienced physical or sexual trauma.  Having given birth more than once.  Taking birth control pills.  Having gone through menopause.  Having recently given birth, typically within the past 3-6 months.  Breastfeeding.  What are the signs or symptoms? The main symptom of this condition is pain in any part of the genitals or lower abdomen during or after sexual activity. This may include pain  during sexual arousal, genital stimulation, or orgasm. Pain may get worse when anything is inserted into the vagina, or when the genitals are touched in any way, such as when sitting or wearing pants. Pain can range from mild to severe, depending on the cause of the condition. In some cases, symptoms go away with treatment and return (recur) at a later date. How is this diagnosed? This condition may be diagnosed based on:  Your symptoms, including: ? Where your pain is located. ? When your pain occurs.  Your medical history.  A physical exam. This may include a pelvic exam and a Pap test. This is a screening test that is used to check for signs of cancer of the vagina, cervix, and uterus.  Tests, including: ? Blood tests. ? Ultrasound. This uses sound waves to make a picture of the area that is being tested. ? Urine culture. This test involves checking a urine sample for signs of infection. ? Culture test. This is when your health care provider uses a swab to collect a sample of vaginal fluid. The sample is checked for signs of infection. ? X-rays. ? MRI. ? CT scan. ? Laparoscopy. This is a procedure in which a small incision is made in your lower abdomen and a lighted, pencil-sized instrument (laparoscope) is passed through the incision and used to look inside your pelvis.  You may be referred to a health care provider who specializes in women's health (gynecologist). In some cases, diagnosing the cause of dyspareunia can be difficult. How is this treated? Treatment depends on the cause of your condition and your symptoms. In most cases, you may need to stop sexual activity until your symptoms   improve. Treatment may include:  Lubricants.  Kegel exercises or vaginal dilators.  Medicated skin creams.  Medicated vaginal creams.  Hormonal therapy.  Antibiotic medicine to prevent or fight infection.  Medicines that help to relieve pain.  Medicines that treat depression  (antidepressants).  Psychological counseling.  Sex therapy.  Surgery.  Follow these instructions at home: Lifestyle  Avoid tight clothing and irritating materials around your genital and abdominal area.  Use water-based lubricants as needed. Avoid oil-based lubricants.  Do not use any products that irritate you. This may include certain condoms, spermicides, lubricants, soaps, tampons, vaginal sprays, or douches.  Always practice safe sex. Talk with your health care provider about which form of birth control (contraception) is best for you.  Maintain open communication with your sexual partner. General instructions  Take over-the-counter and prescription medicines only as told by your health care provider.  If you had tests done, it is your responsibility to get your tests results. Ask your health care provider or the department performing the test when your results will be ready.  Urinate before you engage in sexual activity.  Consider joining a support group.  Keep all follow-up visits as told by your health care provider. This is important. Contact a health care provider if:  You develop vaginal bleeding after sexual intercourse.  You develop a lump at the opening of your vagina. Seek medical care even if the lump is painless.  You have: ? Abnormal vaginal discharge. ? Vaginal dryness. ? Itchiness or irritation of your vulva or vagina. ? A new rash. ? Symptoms that get worse or do not improve with treatment. ? A fever. ? Pain when you urinate. ? Blood in your urine. Get help right away if:  You develop severe pain in your abdomen during or shortly after sexual intercourse.  You pass out after having sexual intercourse. This information is not intended to replace advice given to you by your health care provider. Make sure you discuss any questions you have with your health care provider. Document Released: 11/10/2007 Document Revised: 03/01/2016 Document  Reviewed: 05/23/2015 Elsevier Interactive Patient Education  2018 [employer]  

## 2017-10-14 NOTE — Progress Notes (Signed)
Subjective:    Margaretha Nordell is a 36 y.o. female who presents for an annual exam. She reports some dyspareunia intermittently for less than a year. She desires STD testing today.  She reports occasional visual disturbances and vaginal itching.  She has hx of asthma but does not require an inhaler or other medications.  The patient is sexually active. GYN screening history: last pap: was normal. She is using Depo Provera for contraception, last injection 09/29/17.  She has no other health concerns today.   Menstrual History: OB History    Gravida Para Term Preterm AB Living   4 4 3 1   3    SAB TAB Ectopic Multiple Live Births         0 3       No LMP recorded. Patient has had an injection.    The following portions of the patient's history were reviewed and updated as appropriate: allergies, current medications, past family history, past medical history, past social history, past surgical history and problem list.  Review of Systems Pertinent items noted in HPI and remainder of comprehensive ROS otherwise negative.    Objective:   BP 124/83   Pulse 84   Wt 133 lb 4.8 oz (60.5 kg)   BMI 27.86 kg/m   VS reviewed, nursing note reviewed,  Constitutional: well developed, well nourished, no distress HEENT: normocephalic HEART: normal rate, heart sounds, regular rhythm RESP: normal effort, lung sounds clear and equal bilaterally Breast Exam:  right breast normal without mass, skin or nipple changes or axillary nodes, left breast normal without mass, skin or nipple changes or axillary nodes Abdomen: soft Neuro: alert and oriented x 3 Skin: warm, dry Psych: affect normal Pelvic exam: Cervix pink, visually closed, without lesion, scant white creamy discharge, vaginal walls and external genitalia normal Bimanual exam: Cervix 0/long/high, firm, anterior, neg CMT, uterus nontender, nonenlarged, adnexa without tenderness, enlargement, or mass    Assessment/Plan:   1. Well woman exam with  routine gynecological exam  - Cytology - PAP with trichomonas, GCC - HIV antibody - RPR --Recommend primary care and/or optometry for other health conditions.  2. Dyspareunia, female --Normal pelvic exam today. - US PELVIS (TRANSABDOMINAL ONLY); Future - US PELVIS TRANSVANGINAL NON-OB (TV ONLY); Future

## 2017-10-15 ENCOUNTER — Ambulatory Visit (HOSPITAL_COMMUNITY): Admission: RE | Admit: 2017-10-15 | Payer: BLUE CROSS/BLUE SHIELD | Source: Ambulatory Visit

## 2017-10-15 LAB — RPR: RPR: NONREACTIVE

## 2017-10-15 LAB — HIV ANTIBODY (ROUTINE TESTING W REFLEX): HIV SCREEN 4TH GENERATION: NONREACTIVE

## 2017-10-17 LAB — CYTOLOGY - PAP
Chlamydia: NEGATIVE
Diagnosis: NEGATIVE
HPV: NOT DETECTED
Neisseria Gonorrhea: NEGATIVE
Trichomonas: NEGATIVE

## 2017-12-16 ENCOUNTER — Ambulatory Visit (INDEPENDENT_AMBULATORY_CARE_PROVIDER_SITE_OTHER): Payer: BLUE CROSS/BLUE SHIELD

## 2017-12-16 VITALS — BP 138/78 | HR 80

## 2017-12-16 DIAGNOSIS — Z3042 Encounter for surveillance of injectable contraceptive: Secondary | ICD-10-CM

## 2017-12-16 MED ORDER — MEDROXYPROGESTERONE ACETATE 150 MG/ML IM SUSP
150.0000 mg | Freq: Once | INTRAMUSCULAR | Status: AC
  Administered 2017-12-16: 150 mg via INTRAMUSCULAR

## 2017-12-16 NOTE — Progress Notes (Signed)
Pt presented to the office for a Depo injection. Next Depo is due April 30-May 14.

## 2017-12-17 NOTE — Progress Notes (Signed)
Reviewed patient's chart. Agree with nursing documentation.  Raynelle FanningJulie P. Degele, MD OB Fellow

## 2018-02-21 DIAGNOSIS — R0981 Nasal congestion: Secondary | ICD-10-CM | POA: Diagnosis not present

## 2018-02-21 DIAGNOSIS — R05 Cough: Secondary | ICD-10-CM | POA: Diagnosis not present

## 2018-02-21 DIAGNOSIS — R509 Fever, unspecified: Secondary | ICD-10-CM | POA: Diagnosis not present

## 2018-02-21 DIAGNOSIS — J189 Pneumonia, unspecified organism: Secondary | ICD-10-CM | POA: Diagnosis not present

## 2018-03-06 ENCOUNTER — Ambulatory Visit (INDEPENDENT_AMBULATORY_CARE_PROVIDER_SITE_OTHER): Payer: BLUE CROSS/BLUE SHIELD

## 2018-03-06 VITALS — BP 139/97 | HR 91 | Wt 131.2 lb

## 2018-03-06 DIAGNOSIS — Z3042 Encounter for surveillance of injectable contraceptive: Secondary | ICD-10-CM

## 2018-03-06 MED ORDER — MEDROXYPROGESTERONE ACETATE 150 MG/ML IM SUSP
150.0000 mg | Freq: Once | INTRAMUSCULAR | Status: AC
  Administered 2018-03-06: 150 mg via INTRAMUSCULAR

## 2018-03-06 NOTE — Progress Notes (Signed)
Pt came in today for Depo Shot, Took Well.Also gave her contact info to [facility] regarding BP.Pt verbalized understanding.

## 2018-03-07 NOTE — Progress Notes (Signed)
Chart reviewed for nurse visit. Agree with plan of care.   Marylene Land, CNM 03/07/2018 2:32 PM

## 2018-04-22 ENCOUNTER — Encounter (INDEPENDENT_AMBULATORY_CARE_PROVIDER_SITE_OTHER): Payer: Self-pay | Admitting: Physician Assistant

## 2018-04-22 ENCOUNTER — Other Ambulatory Visit: Payer: Self-pay

## 2018-04-22 ENCOUNTER — Ambulatory Visit (INDEPENDENT_AMBULATORY_CARE_PROVIDER_SITE_OTHER): Payer: BLUE CROSS/BLUE SHIELD | Admitting: Physician Assistant

## 2018-04-22 ENCOUNTER — Other Ambulatory Visit (HOSPITAL_COMMUNITY)
Admission: RE | Admit: 2018-04-22 | Discharge: 2018-04-22 | Disposition: A | Discharge status: Home / Self Care | Payer: BLUE CROSS/BLUE SHIELD | Source: Ambulatory Visit | Attending: Physician Assistant | Admitting: Physician Assistant

## 2018-04-22 VITALS — BP 140/87 | HR 73 | Temp 98.0°F | Ht <= 58 in | Wt 134.6 lb

## 2018-04-22 DIAGNOSIS — R319 Hematuria, unspecified: Secondary | ICD-10-CM | POA: Diagnosis not present

## 2018-04-22 DIAGNOSIS — I10 Essential (primary) hypertension: Secondary | ICD-10-CM

## 2018-04-22 DIAGNOSIS — R300 Dysuria: Secondary | ICD-10-CM | POA: Diagnosis not present

## 2018-04-22 DIAGNOSIS — R809 Proteinuria, unspecified: Secondary | ICD-10-CM

## 2018-04-22 DIAGNOSIS — N898 Other specified noninflammatory disorders of vagina: Secondary | ICD-10-CM

## 2018-04-22 LAB — POCT URINALYSIS DIPSTICK
Bilirubin, UA: NEGATIVE
GLUCOSE UA: NEGATIVE
Ketones, UA: NEGATIVE
Leukocytes, UA: NEGATIVE
PROTEIN UA: POSITIVE — AB
Spec Grav, UA: 1.015 (ref 1.010–1.025)
Urobilinogen, UA: 0.2 E.U./dL
pH, UA: 7 (ref 5.0–8.0)

## 2018-04-22 MED ORDER — METRONIDAZOLE 500 MG PO TABS: 500.0000 mg | ORAL_TABLET | Freq: Two times a day (BID) | ORAL | 0 refills | Status: AC

## 2018-04-22 MED ORDER — HYDROCHLOROTHIAZIDE 12.5 MG PO TABS: 12.5000 mg | ORAL_TABLET | Freq: Every day | ORAL | 3 refills | Status: DC

## 2018-04-22 MED ORDER — FLUCONAZOLE 150 MG PO TABS: 150.0000 mg | ORAL_TABLET | ORAL | 0 refills | Status: DC

## 2018-04-22 NOTE — Progress Notes (Signed)
Subjective:  Patient ID: Danielle Hoover, female    DOB: August 08, 1981  Age: 37 y.o. MRN: [id number]  CC: dysuria  HPI Danielle Hoover is a 37 y.o. female with a medical history of anemia, asthma, chlamydia, HA, and HTN presents as a new patient for management of blood pressure. Also has dysuria, suprapubic discomfort, and urinary frequency since "more than a week". Endorses some whitish vaginal discharge that burns and itches, also for approximately one week. Does not endorse genital lesions, f/c/n/v, pelvic pain, nor does she attribute symptoms to sex. Says husband is away working.Does not know about him being faithful but she says she is faithful.  No other symptoms or complaints.       ROS Review of Systems  Constitutional: Negative for chills, fever and malaise/fatigue.  Eyes: Negative for blurred vision.  Respiratory: Negative for shortness of breath.   Cardiovascular: Negative for chest pain and palpitations.  Gastrointestinal: Negative for abdominal pain and nausea.  Genitourinary: Positive for dysuria, frequency and hematuria. Negative for flank pain and urgency.  Musculoskeletal: Negative for joint pain and myalgias.  Skin: Negative for rash.  Neurological: Negative for tingling and headaches.  Psychiatric/Behavioral: Negative for depression. The patient is not nervous/anxious.     Objective:  Ht 4' 9.87" (1.47 m)   Wt 134 lb 9.6 oz (61.1 kg)   BMI 28.25 kg/m   Vitals:   04/22/18 0939  BP: 140/87  Pulse: 73  Temp: 98 F (36.7 C)  TempSrc: Oral  SpO2: 97%  Weight: 134 lb 9.6 oz (61.1 kg)  Height: 4' 9.87" (1.47 m)      Physical Exam  Constitutional: She is oriented to person, place, and time.  Well developed, well nourished, NAD, polite  HENT:  Head: Normocephalic and atraumatic.  Eyes: No scleral icterus.  Neck: Normal range of motion. Neck supple. No thyromegaly present.  Cardiovascular: Normal rate, regular rhythm and normal heart sounds.  Pulmonary/Chest: Effort  normal and breath sounds normal.  Abdominal: Soft. Bowel sounds are normal. There is no tenderness.  Genitourinary:  Genitourinary Comments: No vulvar lesions.No vaginal discharge. Mildly inflamed cervix without motion tenderness. No adnexal TTP or mass bilaterally. No uterine mass or tenderness.  Musculoskeletal: She exhibits no edema.  Neurological: She is alert and oriented to person, place, and time.  Skin: Skin is warm and dry. No rash noted. No erythema. No pallor.  Psychiatric: She has a normal mood and affect. Her behavior is normal. Thought content normal.  Vitals reviewed.    Assessment & Plan:    1. Hypertension, unspecified type - Begin HCTZ 12.5 mg one tablet po qam x90 days 3 refills  2. Dysuria - Urinalysis Dipstick with pos protein and blood - Urine cytology ancillary only - HIV antibody (with reflex)  3. Vaginal discharge - fluconazole (DIFLUCAN) 150 MG tablet; Take 1 tablet (150 mg total) by mouth once a week.  Dispense: 2 tablet; Refill: 0 - metroNIDAZOLE (FLAGYL) 500 MG tablet; Take 1 tablet (500 mg total) by mouth 2 (two) times daily for 7 days.  Dispense: 14 tablet; Refill: 0 - Urine cytology ancillary only - HIV antibody (with reflex)  4. Proteinuria, unspecified type - Microalbumin / creatinine urine ratio - HIV antibody (with reflex)  5. Hematuria, unspecified type - Microalbumin / creatinine urine ratio - HIV antibody (with reflex)   Meds ordered this encounter  Medications  . fluconazole (DIFLUCAN) 150 MG tablet    Sig: Take 1 tablet (150 mg total) by mouth once a  week.    Dispense:  2 tablet    Refill:  0    Order Specific Question:   Supervising Provider    Answer:   Hoy RegisterNEWLIN, ENOBONG [[id number]]  . metroNIDAZOLE (FLAGYL) 500 MG tablet    Sig: Take 1 tablet (500 mg total) by mouth 2 (two) times daily for 7 days.    Dispense:  14 tablet    Refill:  0    Order Specific Question:   Supervising Provider    Answer:   Hoy RegisterNEWLIN, ENOBONG [[id number]]     Follow-up: Return in about 1 month (around 05/20/2018) for f/u HTN.   Loletta Specteroger David Glendal Cassaday PA

## 2018-04-22 NOTE — Patient Instructions (Signed)
DASH Eating Plan DASH stands for "Dietary Approaches to Stop Hypertension." The DASH eating plan is a healthy eating plan that has been shown to reduce high blood pressure (hypertension). It may also reduce your risk for type 2 diabetes, heart disease, and stroke. The DASH eating plan may also help with weight loss. What are tips for following this plan? General guidelines  Avoid eating more than 2,300 mg (milligrams) of salt (sodium) a day. If you have hypertension, you may need to reduce your sodium intake to 1,500 mg a day.  Limit alcohol intake to no more than 1 drink a day for nonpregnant women and 2 drinks a day for men. One drink equals 12 oz of beer, 5 oz of wine, or 1 oz of hard liquor.  Work with your health care provider to maintain a healthy body weight or to lose weight. Ask what an ideal weight is for you.  Get at least 30 minutes of exercise that causes your heart to beat faster (aerobic exercise) most days of the week. Activities may include walking, swimming, or biking.  Work with your health care provider or diet and nutrition specialist (dietitian) to adjust your eating plan to your individual calorie needs. Reading food labels  Check food labels for the amount of sodium per serving. Choose foods with less than 5 percent of the Daily Value of sodium. Generally, foods with less than 300 mg of sodium per serving fit into this eating plan.  To find whole grains, look for the word "whole" as the first word in the ingredient list. Shopping  Buy products labeled as "low-sodium" or "no salt added."  Buy fresh foods. Avoid canned foods and premade or frozen meals. Cooking  Avoid adding salt when cooking. Use salt-free seasonings or herbs instead of table salt or sea salt. Check with your health care provider or pharmacist before using salt substitutes.  Do not fry foods. Cook foods using healthy methods such as baking, boiling, grilling, and broiling instead.  Cook with  heart-healthy oils, such as olive, canola, soybean, or sunflower oil. Meal planning   Eat a balanced diet that includes: ? 5 or more servings of fruits and vegetables each day. At each meal, try to fill half of your plate with fruits and vegetables. ? Up to 6-8 servings of whole grains each day. ? Less than 6 oz of lean meat, poultry, or fish each day. A 3-oz serving of meat is about the same size as a deck of cards. One egg equals 1 oz. ? 2 servings of low-fat dairy each day. ? A serving of nuts, seeds, or beans 5 times each week. ? Heart-healthy fats. Healthy fats called Omega-3 fatty acids are found in foods such as flaxseeds and coldwater fish, like sardines, salmon, and mackerel.  Limit how much you eat of the following: ? Canned or prepackaged foods. ? Food that is high in trans fat, such as fried foods. ? Food that is high in saturated fat, such as fatty meat. ? Sweets, desserts, sugary drinks, and other foods with added sugar. ? Full-fat dairy products.  Do not salt foods before eating.  Try to eat at least 2 vegetarian meals each week.  Eat more home-cooked food and less restaurant, buffet, and fast food.  When eating at a restaurant, ask that your food be prepared with less salt or no salt, if possible. What foods are recommended? The items listed may not be a complete list. Talk with your dietitian about what   dietary choices are best for you. Grains Whole-grain or whole-wheat bread. Whole-grain or whole-wheat pasta. Brown rice. Oatmeal. Quinoa. Bulgur. Whole-grain and low-sodium cereals. Pita bread. Low-fat, low-sodium crackers. Whole-wheat flour tortillas. Vegetables Fresh or frozen vegetables (raw, steamed, roasted, or grilled). Low-sodium or reduced-sodium tomato and vegetable juice. Low-sodium or reduced-sodium tomato sauce and tomato paste. Low-sodium or reduced-sodium canned vegetables. Fruits All fresh, dried, or frozen fruit. Canned fruit in natural juice (without  added sugar). Meat and other protein foods Skinless chicken or turkey. Ground chicken or turkey. Pork with fat trimmed off. Fish and seafood. Egg whites. Dried beans, peas, or lentils. Unsalted nuts, nut butters, and seeds. Unsalted canned beans. Lean cuts of beef with fat trimmed off. Low-sodium, lean deli meat. Dairy Low-fat (1%) or fat-free (skim) milk. Fat-free, low-fat, or reduced-fat cheeses. Nonfat, low-sodium ricotta or cottage cheese. Low-fat or nonfat yogurt. Low-fat, low-sodium cheese. Fats and oils Soft margarine without trans fats. Vegetable oil. Low-fat, reduced-fat, or light mayonnaise and salad dressings (reduced-sodium). Canola, safflower, olive, soybean, and sunflower oils. Avocado. Seasoning and other foods Herbs. Spices. Seasoning mixes without salt. Unsalted popcorn and pretzels. Fat-free sweets. What foods are not recommended? The items listed may not be a complete list. Talk with your dietitian about what dietary choices are best for you. Grains Baked goods made with fat, such as croissants, muffins, or some breads. Dry pasta or rice meal packs. Vegetables Creamed or fried vegetables. Vegetables in a cheese sauce. Regular canned vegetables (not low-sodium or reduced-sodium). Regular canned tomato sauce and paste (not low-sodium or reduced-sodium). Regular tomato and vegetable juice (not low-sodium or reduced-sodium). Pickles. Olives. Fruits Canned fruit in a light or heavy syrup. Fried fruit. Fruit in cream or butter sauce. Meat and other protein foods Fatty cuts of meat. Ribs. Fried meat. Bacon. Sausage. Bologna and other processed lunch meats. Salami. Fatback. Hotdogs. Bratwurst. Salted nuts and seeds. Canned beans with added salt. Canned or smoked fish. Whole eggs or egg yolks. Chicken or turkey with skin. Dairy Whole or 2% milk, cream, and half-and-half. Whole or full-fat cream cheese. Whole-fat or sweetened yogurt. Full-fat cheese. Nondairy creamers. Whipped toppings.  Processed cheese and cheese spreads. Fats and oils Butter. Stick margarine. Lard. Shortening. Ghee. Bacon fat. Tropical oils, such as coconut, palm kernel, or palm oil. Seasoning and other foods Salted popcorn and pretzels. Onion salt, garlic salt, seasoned salt, table salt, and sea salt. Worcestershire sauce. Tartar sauce. Barbecue sauce. Teriyaki sauce. Soy sauce, including reduced-sodium. Steak sauce. Canned and packaged gravies. Fish sauce. Oyster sauce. Cocktail sauce. Horseradish that you find on the shelf. Ketchup. Mustard. Meat flavorings and tenderizers. Bouillon cubes. Hot sauce and Tabasco sauce. Premade or packaged marinades. Premade or packaged taco seasonings. Relishes. Regular salad dressings. Where to find more information:  National Heart, Lung, and Blood Institute: www.nhlbi.nih.gov  American Heart Association: www.heart.org Summary  The DASH eating plan is a healthy eating plan that has been shown to reduce high blood pressure (hypertension). It may also reduce your risk for type 2 diabetes, heart disease, and stroke.  With the DASH eating plan, you should limit salt (sodium) intake to 2,300 mg a day. If you have hypertension, you may need to reduce your sodium intake to 1,500 mg a day.  When on the DASH eating plan, aim to eat more fresh fruits and vegetables, whole grains, lean proteins, low-fat dairy, and heart-healthy fats.  Work with your health care provider or diet and nutrition specialist (dietitian) to adjust your eating plan to your individual   calorie needs. This information is not intended to replace advice given to you by your health care provider. Make sure you discuss any questions you have with your health care provider. Document Released: 10/10/2011 Document Revised: 10/14/2016 Document Reviewed: 10/14/2016 Elsevier Interactive Patient Education  2018 Elsevier Inc.  

## 2018-04-23 LAB — COMPREHENSIVE METABOLIC PANEL
ALBUMIN: 3.8 g/dL (ref 3.5–5.5)
ALT: 10 IU/L (ref 0–32)
AST: 12 IU/L (ref 0–40)
Albumin/Globulin Ratio: 1.2 (ref 1.2–2.2)
Alkaline Phosphatase: 73 IU/L (ref 39–117)
BILIRUBIN TOTAL: 0.5 mg/dL (ref 0.0–1.2)
BUN / CREAT RATIO: 16 (ref 9–23)
BUN: 10 mg/dL (ref 6–20)
CALCIUM: 9.2 mg/dL (ref 8.7–10.2)
CHLORIDE: 108 mmol/L — AB (ref 96–106)
CO2: 22 mmol/L (ref 20–29)
CREATININE: 0.62 mg/dL (ref 0.57–1.00)
GFR, EST AFRICAN AMERICAN: 135 mL/min/{1.73_m2} (ref >59)
GFR, EST NON AFRICAN AMERICAN: 117 mL/min/{1.73_m2} (ref >59)
GLUCOSE: 87 mg/dL (ref 65–99)
Globulin, Total: 3.2 g/dL (ref 1.5–4.5)
Potassium: 3.7 mmol/L (ref 3.5–5.2)
Sodium: 143 mmol/L (ref 134–144)
TOTAL PROTEIN: 7 g/dL (ref 6.0–8.5)

## 2018-04-23 LAB — MICROALBUMIN / CREATININE URINE RATIO
CREATININE, UR: 67.5 mg/dL
Microalb/Creat Ratio: 347.3 mg/g creat — ABNORMAL HIGH (ref 0.0–30.0)
Microalbumin, Urine: 234.4 ug/mL

## 2018-04-23 LAB — CBC WITH DIFFERENTIAL/PLATELET
BASOS ABS: 0 10*3/uL (ref 0.0–0.2)
Basos: 0 %
EOS (ABSOLUTE): 0.3 10*3/uL (ref 0.0–0.4)
Eos: 3 %
HEMOGLOBIN: 13.9 g/dL (ref 11.1–15.9)
Hematocrit: 39.9 % (ref 34.0–46.6)
IMMATURE GRANS (ABS): 0 10*3/uL (ref 0.0–0.1)
IMMATURE GRANULOCYTES: 0 %
LYMPHS: 32 %
Lymphocytes Absolute: 2.7 10*3/uL (ref 0.7–3.1)
MCH: 25.9 pg — AB (ref 26.6–33.0)
MCHC: 34.8 g/dL (ref 31.5–35.7)
MCV: 74 fL — ABNORMAL LOW (ref 79–97)
Monocytes Absolute: 0.3 10*3/uL (ref 0.1–0.9)
Monocytes: 4 %
NEUTROS ABS: 5.1 10*3/uL (ref 1.4–7.0)
NEUTROS PCT: 61 %
PLATELETS: 252 10*3/uL (ref 150–450)
RBC: 5.37 x10E6/uL — ABNORMAL HIGH (ref 3.77–5.28)
RDW: 14.4 % (ref 12.3–15.4)
WBC: 8.4 10*3/uL (ref 3.4–10.8)

## 2018-04-23 LAB — URINE CYTOLOGY ANCILLARY ONLY
BACTERIAL VAGINITIS: NEGATIVE
CANDIDA VAGINITIS: NEGATIVE
Chlamydia: NEGATIVE
Neisseria Gonorrhea: NEGATIVE
Trichomonas: NEGATIVE

## 2018-04-23 LAB — LIPID PANEL
CHOL/HDL RATIO: 4.4 ratio (ref 0.0–4.4)
Cholesterol, Total: 151 mg/dL (ref 100–199)
HDL: 34 mg/dL — ABNORMAL LOW (ref >39)
LDL CALC: 97 mg/dL (ref 0–99)
Triglycerides: 100 mg/dL (ref 0–149)
VLDL CHOLESTEROL CAL: 20 mg/dL (ref 5–40)

## 2018-04-23 LAB — HIV ANTIBODY (ROUTINE TESTING W REFLEX): HIV SCREEN 4TH GENERATION: NONREACTIVE

## 2018-05-19 ENCOUNTER — Encounter (INDEPENDENT_AMBULATORY_CARE_PROVIDER_SITE_OTHER): Payer: Self-pay

## 2018-05-21 ENCOUNTER — Other Ambulatory Visit: Payer: Self-pay

## 2018-05-21 ENCOUNTER — Ambulatory Visit (INDEPENDENT_AMBULATORY_CARE_PROVIDER_SITE_OTHER): Payer: BLUE CROSS/BLUE SHIELD | Admitting: Physician Assistant

## 2018-05-21 ENCOUNTER — Encounter (INDEPENDENT_AMBULATORY_CARE_PROVIDER_SITE_OTHER): Payer: Self-pay | Admitting: Physician Assistant

## 2018-05-21 VITALS — BP 129/86 | HR 85 | Temp 98.5°F | Ht <= 58 in | Wt 135.4 lb

## 2018-05-21 DIAGNOSIS — I10 Essential (primary) hypertension: Secondary | ICD-10-CM

## 2018-05-21 DIAGNOSIS — R809 Proteinuria, unspecified: Secondary | ICD-10-CM

## 2018-05-21 NOTE — Progress Notes (Signed)
   Subjective:  Patient ID: Danielle Hoover, female    DOB: 06/26/1981  Age: 37 y.o. MRN: [id number]  CC: HTN  HPI Mariena Konieczny is a 37 y.o. female with a medical history of anemia, asthma, chlamydia, HA, and HTN presents to f/u on HTN. Last BP 140/87 mmHg one month ago. Prescribed HCTZ 12.5 mg. BP 129/86 mmHg in clinic today. Says she is feeling generally better and nocturia has reduced to once or twice per night. Denies CP, palpitations, SOB, HA, abdominal pain, tingling, numbness, rash, f/c/n/v, or GI/GU sxs.      Outpatient Medications Prior to Visit  Medication Sig Dispense Refill  . hydrochlorothiazide (HYDRODIURIL) 12.5 MG tablet Take 1 tablet (12.5 mg total) by mouth daily. 90 tablet 3  . fluconazole (DIFLUCAN) 150 MG tablet Take 1 tablet (150 mg total) by mouth once a week. 2 tablet 0   Facility-Administered Medications Prior to Visit  Medication Dose Route Frequency Provider Last Rate Last Dose  . medroxyPROGESTERone (DEPO-PROVERA) injection 150 mg  150 mg Intramuscular Q90 days Adam PhenixArnold, James G, MD   150 mg at 09/29/17 1626     ROS Review of Systems  Constitutional: Negative for chills, fever and malaise/fatigue.  Eyes: Negative for blurred vision.  Respiratory: Negative for shortness of breath.   Cardiovascular: Negative for chest pain and palpitations.  Gastrointestinal: Negative for abdominal pain and nausea.  Genitourinary: Negative for dysuria and hematuria.  Musculoskeletal: Negative for joint pain and myalgias.  Skin: Negative for rash.  Neurological: Negative for tingling and headaches.  Psychiatric/Behavioral: Negative for depression. The patient is not nervous/anxious.     Objective:  BP 129/86 (BP Location: Right Arm, Patient Position: Sitting, Cuff Size: Normal)   Pulse 85   Temp 98.5 F (36.9 C) (Oral)   Ht 4\' 9"  (1.448 m)   Wt 135 lb 6.4 oz (61.4 kg)   SpO2 99%   BMI 29.30 kg/m   BP/Weight 05/21/2018 04/22/2018 03/06/2018  Systolic BP 129 140 139  Diastolic BP  86 87 97  Wt. (Lbs) 135.4 134.6 131.2  BMI 29.3 28.25 27.42      Physical Exam  Constitutional: She is oriented to person, place, and time.  Well developed, well nourished, NAD, polite  HENT:  Head: Normocephalic and atraumatic.  Eyes: No scleral icterus.  Neck: Normal range of motion. Neck supple. No thyromegaly present.  Cardiovascular: Normal rate, regular rhythm and normal heart sounds.  Pulmonary/Chest: Effort normal and breath sounds normal.  Abdominal: Soft. Bowel sounds are normal. There is no tenderness.  Musculoskeletal: She exhibits no edema.  Neurological: She is alert and oriented to person, place, and time.  Skin: Skin is warm and dry. No rash noted. No erythema. No pallor.  Psychiatric: She has a normal mood and affect. Her behavior is normal. Thought content normal.  Vitals reviewed.    Assessment & Plan:    1. Essential hypertension - Demonstrates good blood pressure control with HCTZ 12.5 mg. I have advised patient to lose weight and consume a low salt diet to further optimize BP.   2. Proteinuria, unspecified type - 347.3 mg/g creat one month ago. Attributed to uncontrolled hypertension. Will retest at f/u appt.     Follow-up: Return in about 6 months (around 11/21/2018) for HTN.   Loletta Specteroger David Diera Wirkkala PA

## 2018-05-21 NOTE — Patient Instructions (Signed)

## 2018-05-22 ENCOUNTER — Ambulatory Visit (INDEPENDENT_AMBULATORY_CARE_PROVIDER_SITE_OTHER): Payer: BLUE CROSS/BLUE SHIELD | Admitting: *Deleted

## 2018-05-22 VITALS — BP 121/79 | HR 95 | Ht <= 58 in | Wt 135.3 lb

## 2018-05-22 DIAGNOSIS — Z3042 Encounter for surveillance of injectable contraceptive: Secondary | ICD-10-CM | POA: Diagnosis not present

## 2018-05-22 NOTE — Progress Notes (Signed)
Interpreter Donavan BurnetWeir Siu present for encounter. Per chart review, pt has started care with [facility]CP [facility]. Depo Provera administered as scheduled. Pt tolerated well.  Pt states she would like another pregnancy and does not want to receive next Depo injection. If she should change her mind, the next injection would be due 10/4-10/18.

## 2018-11-04 NOTE — L&D Delivery Note (Addendum)
Delivery Note:  [id number] at [redacted]w[redacted]d  Admitting diagnosis: IOL for Chronic HTN, AMA, Hx IUFD, Lewis Isoimmunization Risks: None Augmentation: AROM, Pitocin and Cytotec ROM: 08/10/2019 @ 1733  Complete dilation at   1949 Onset of pushing at 1950 FHR second stage cat 2 - variables with ctx  Analgesia /Anesthesia intrapartum:None  Pushing in lithotomy position  CNM, RN, FOB present for birth and supportive  Delivery of a Live born female  Birth Weight:  pending APGAR: 9, 9   Newborn Delivery   Birth date/time: 08/10/2019 19:55:00 Delivery type: Vaginal, Spontaneous      in vertex presentation, position LOA to LOT.  Nuchal Cord: No  Cord double clamped after cessation of pulsation, cut by SNM.  Collection of cord blood for typing completed. Cord blood donation- N/A Arterial cord blood sample-  N/A  Placenta delivered-Spontaneous  and intact Uterotonics: IM Pitocin Placenta to L&D to be discarded Uterine tone firm, bleeding light  Right labial laceration identified.  Episiotomy:None  Local analgesia: N/A  Repair: None - hemostatic, no repair needed. Est. Blood Loss (TK):1.60   Complications: None  APGAR:1 min- 9, 5 min- 9   Mom to postpartum.  Baby to Couplet care / Skin to Skin.  Delivery Report:  Review the Delivery Report for details.     Signed: Juanna Cao, SNM, BSN 08/10/2019, 8:03 PM    I was present and gloved with the student for the entire delivery. I agree with the above note.   Marcille Buffy DNP, CNM  08/13/19  8:07 AM

## 2018-11-23 ENCOUNTER — Other Ambulatory Visit: Payer: Self-pay

## 2018-11-23 ENCOUNTER — Ambulatory Visit (INDEPENDENT_AMBULATORY_CARE_PROVIDER_SITE_OTHER): Payer: BLUE CROSS/BLUE SHIELD | Admitting: Internal Medicine

## 2018-11-23 ENCOUNTER — Encounter (INDEPENDENT_AMBULATORY_CARE_PROVIDER_SITE_OTHER): Payer: Self-pay | Admitting: Internal Medicine

## 2018-11-23 VITALS — BP 132/88 | HR 74 | Temp 98.0°F | Resp 16 | Wt 136.0 lb

## 2018-11-23 DIAGNOSIS — N760 Acute vaginitis: Secondary | ICD-10-CM | POA: Diagnosis not present

## 2018-11-23 DIAGNOSIS — Z3202 Encounter for pregnancy test, result negative: Secondary | ICD-10-CM | POA: Diagnosis not present

## 2018-11-23 DIAGNOSIS — I10 Essential (primary) hypertension: Secondary | ICD-10-CM | POA: Diagnosis not present

## 2018-11-23 DIAGNOSIS — N926 Irregular menstruation, unspecified: Secondary | ICD-10-CM | POA: Diagnosis not present

## 2018-11-23 LAB — POCT URINALYSIS DIPSTICK
Appearance: NORMAL
Bilirubin, UA: NEGATIVE
Blood, UA: NEGATIVE
Glucose, UA: NEGATIVE
Ketones, UA: NEGATIVE
LEUKOCYTES UA: NEGATIVE
NITRITE UA: NEGATIVE
PROTEIN UA: POSITIVE — AB
Spec Grav, UA: 1.02 (ref 1.010–1.025)
Urobilinogen, UA: 0.2 E.U./dL
pH, UA: 5.5 (ref 5.0–8.0)

## 2018-11-23 LAB — POCT URINE PREGNANCY: Preg Test, Ur: NEGATIVE

## 2018-11-23 MED ORDER — HYDROCHLOROTHIAZIDE 12.5 MG PO TABS: 12.5000 mg | ORAL_TABLET | Freq: Every day | ORAL | 1 refills | Status: DC

## 2018-11-23 MED ORDER — FLUCONAZOLE 150 MG PO TABS: 150.0000 mg | ORAL_TABLET | Freq: Once | ORAL | 0 refills | Status: AC

## 2018-11-23 NOTE — Progress Notes (Signed)
Patient ID: Danielle Hoover, female    DOB: Dec 18, 1980  MRN: [id number]  CC: Hypertension   Subjective: Danielle Hoover is a 38 y.o. female who presents for f/u BP at [facility] Her concerns today include:  anemia, asthma, HA, and HTN.  HTN:  Out of HCTZ x 1 wk.  Does not limit salt in foods.  No LE edema, CP, SOB.  No exercise.    Last menses 10/21/2018.  Stop depo in June.  She is trying to get pregnant. Some dysuria since Friday. No blood in urine Reports thick white dischg vaginal itching x2 days.  She is sexually active with her husband only.  She declines STD check.     Patient Active Problem List   Diagnosis Date Noted  . Sinus tachycardia 02/20/2015  . Language barrier 10/10/2014     No current outpatient medications on file prior to visit.   Current Facility-Administered Medications on File Prior to Visit  Medication Dose Route Frequency Provider Last Rate Last Dose  . medroxyPROGESTERone (DEPO-PROVERA) injection 150 mg  150 mg Intramuscular Q90 days Adam Phenix, MD   150 mg at 05/22/18 1507    No Known Allergies  Social History   Socioeconomic History  . Marital status: Divorced    Spouse name: Not on file  . Number of children: Not on file  . Years of education: Not on file  . Highest education level: Not on file  Occupational History  . Not on file  Social Needs  . Financial resource strain: Not on file  . Food insecurity:    Worry: Not on file    Inability: Not on file  . Transportation needs:    Medical: Not on file    Non-medical: Not on file  Tobacco Use  . Smoking status: Never Smoker  . Smokeless tobacco: Never Used  Substance and Sexual Activity  . Alcohol use: No  . Drug use: No  . Sexual activity: Yes    Birth control/protection: None    Comment: last week  Lifestyle  . Physical activity:    Days per week: Not on file    Minutes per session: Not on file  . Stress: Not on file  Relationships  . Social connections:    Talks on phone:  Not on file    Gets together: Not on file    Attends religious service: Not on file    Active member of club or organization: Not on file    Attends meetings of clubs or organizations: Not on file    Relationship status: Not on file  . Intimate partner violence:    Fear of current or ex partner: Not on file    Emotionally abused: Not on file    Physically abused: Not on file    Forced sexual activity: Not on file  Other Topics Concern  . Not on file  Social History Narrative   Makes socks   Not married- has a boy friend   Four children. Recent pregnancy     Family History  Problem Relation Age of Onset  . Alcohol abuse Neg Hx   . Arthritis Neg Hx   . Asthma Neg Hx   . Birth defects Neg Hx   . Cancer Neg Hx   . COPD Neg Hx   . Depression Neg Hx   . Diabetes Neg Hx   . Drug abuse Neg Hx   . Early death Neg Hx   . Hearing loss Neg  Hx   . Heart disease Neg Hx   . Hyperlipidemia Neg Hx   . Hypertension Neg Hx   . Kidney disease Neg Hx   . Learning disabilities Neg Hx   . Mental illness Neg Hx   . Mental retardation Neg Hx   . Miscarriages / Stillbirths Neg Hx   . Stroke Neg Hx   . Vision loss Neg Hx   . Varicose Veins Neg Hx     Past Surgical History:  Procedure Laterality Date  . NO PAST SURGERIES      ROS: Review of Systems Negative except as above.    PHYSICAL EXAM: BP 132/88   Pulse 74   Temp 98 F (36.7 C) (Oral)   Resp 16   Wt 136 lb (61.7 kg)   LMP 10/21/2018 (Exact Date)   SpO2 99%   BMI 29.43 kg/m   Physical Exam  General appearance - alert, well appearing, and in no distress Mental status - normal mood, behavior, speech, dress, motor activity, and thought processes Neck - supple, no significant adenopathy Chest - clear to auscultation, no wheezes, rales or rhonchi, symmetric air entry Heart - normal rate, regular rhythm, normal S1, S2, no murmurs, rubs, clicks or gallops Extremities - peripheral pulses normal, no pedal edema, no clubbing or  cyanosis  Results for orders placed or performed in visit on 11/23/18  POCT urine pregnancy  Result Value Ref Range   Preg Test, Ur Negative Negative  Urinalysis Dipstick  Result Value Ref Range   Color, UA yellow    Clarity, UA clear    Glucose, UA Negative Negative   Bilirubin, UA negative    Ketones, UA negative    Spec Grav, UA 1.020 1.010 - 1.025   Blood, UA negative    pH, UA 5.5 5.0 - 8.0   Protein, UA Positive (A) Negative   Urobilinogen, UA 0.2 0.2 or 1.0 E.U./dL   Nitrite, UA negative    Leukocytes, UA Negative Negative   Appearance normal    Odor none      ASSESSMENT AND PLAN:  1. Essential hypertension Close to goal.  Refilled HCTZ. - hydrochlorothiazide (HYDRODIURIL) 12.5 MG tablet; Take 1 tablet (12.5 mg total) by mouth daily.  Dispense: 90 tablet; Refill: 1  2. Acute vaginitis Likely yeast.  We will give empiric treatment with Diflucan. - fluconazole (DIFLUCAN) 150 MG tablet; Take 1 tablet (150 mg total) by mouth once for 1 dose.  Dispense: 1 tablet; Refill: 0 - POCT urine pregnancy - Urinalysis Dipstick  3. Irregular menses And pregnancy test was negative. - POCT urine pregnancy   Patient was given the opportunity to ask questions.  Patient verbalized understanding of the plan and was able to repeat key elements of the plan.  Interpreter from [place], [employer],  is with her. Orders Placed This Encounter  Procedures  . POCT urine pregnancy  . Urinalysis Dipstick     Requested Prescriptions   Signed Prescriptions Disp Refills  . hydrochlorothiazide (HYDRODIURIL) 12.5 MG tablet 90 tablet 1    Sig: Take 1 tablet (12.5 mg total) by mouth daily.  . fluconazole (DIFLUCAN) 150 MG tablet 1 tablet 0    Sig: Take 1 tablet (150 mg total) by mouth once for 1 dose.    No follow-ups on file.  Jonah Blue, MD, FACP

## 2018-11-23 NOTE — Progress Notes (Signed)
F/u HTN- out of medication x 1 weeks   Intermittent abdominal pain-

## 2018-12-29 ENCOUNTER — Encounter: Payer: Self-pay | Admitting: Family Medicine

## 2018-12-29 ENCOUNTER — Ambulatory Visit (INDEPENDENT_AMBULATORY_CARE_PROVIDER_SITE_OTHER): Payer: BLUE CROSS/BLUE SHIELD

## 2018-12-29 DIAGNOSIS — Z3201 Encounter for pregnancy test, result positive: Secondary | ICD-10-CM | POA: Diagnosis not present

## 2018-12-29 NOTE — Progress Notes (Addendum)
Pt here for UPT-Positive, LMP: 11/24/2018, GA: [redacted]w[redacted]d,  EDD: 08/31/19, reviewed meds asked her to start Prenatal vitamins ASAP, pt asked if it was ok for her to continue her HCTZ. I advised for her to stop taking until she speak with a provider on her first visit.Pt took 1 pregnancy test at home & it was positive.Pt verbalized understanding.

## 2018-12-30 LAB — POCT PREGNANCY, URINE: PREG TEST UR: POSITIVE — AB

## 2018-12-30 NOTE — Progress Notes (Signed)
Chart reviewed for nurse visit. Agree with plan of care.   Marylene Land, CNM 12/30/2018 8:59 AM

## 2019-02-01 ENCOUNTER — Telehealth: Payer: Self-pay | Admitting: Obstetrics and Gynecology

## 2019-02-01 NOTE — Telephone Encounter (Signed)
Called the patient, left a detailed voicemail regarding COVID19 restrictions.

## 2019-02-02 ENCOUNTER — Other Ambulatory Visit: Payer: Self-pay

## 2019-02-02 ENCOUNTER — Ambulatory Visit (INDEPENDENT_AMBULATORY_CARE_PROVIDER_SITE_OTHER): Payer: BLUE CROSS/BLUE SHIELD | Admitting: Student

## 2019-02-02 ENCOUNTER — Encounter: Payer: Self-pay | Admitting: Student

## 2019-02-02 VITALS — BP 124/62 | HR 102 | Wt 125.8 lb

## 2019-02-02 DIAGNOSIS — O09529 Supervision of elderly multigravida, unspecified trimester: Secondary | ICD-10-CM | POA: Insufficient documentation

## 2019-02-02 DIAGNOSIS — Z113 Encounter for screening for infections with a predominantly sexual mode of transmission: Secondary | ICD-10-CM

## 2019-02-02 DIAGNOSIS — Z3A10 10 weeks gestation of pregnancy: Secondary | ICD-10-CM

## 2019-02-02 DIAGNOSIS — B373 Candidiasis of vulva and vagina: Secondary | ICD-10-CM | POA: Diagnosis not present

## 2019-02-02 DIAGNOSIS — O219 Vomiting of pregnancy, unspecified: Secondary | ICD-10-CM

## 2019-02-02 DIAGNOSIS — O10919 Unspecified pre-existing hypertension complicating pregnancy, unspecified trimester: Secondary | ICD-10-CM | POA: Insufficient documentation

## 2019-02-02 DIAGNOSIS — B3731 Acute candidiasis of vulva and vagina: Secondary | ICD-10-CM

## 2019-02-02 DIAGNOSIS — N898 Other specified noninflammatory disorders of vagina: Secondary | ICD-10-CM | POA: Diagnosis not present

## 2019-02-02 DIAGNOSIS — O09521 Supervision of elderly multigravida, first trimester: Secondary | ICD-10-CM

## 2019-02-02 DIAGNOSIS — O0990 Supervision of high risk pregnancy, unspecified, unspecified trimester: Secondary | ICD-10-CM | POA: Diagnosis not present

## 2019-02-02 DIAGNOSIS — Z8759 Personal history of other complications of pregnancy, childbirth and the puerperium: Secondary | ICD-10-CM | POA: Insufficient documentation

## 2019-02-02 DIAGNOSIS — O98811 Other maternal infectious and parasitic diseases complicating pregnancy, first trimester: Secondary | ICD-10-CM

## 2019-02-02 DIAGNOSIS — O10911 Unspecified pre-existing hypertension complicating pregnancy, first trimester: Secondary | ICD-10-CM | POA: Diagnosis not present

## 2019-02-02 DIAGNOSIS — Z789 Other specified health status: Secondary | ICD-10-CM

## 2019-02-02 MED ORDER — PREPLUS 27-1 MG PO TABS: 1.0000 | ORAL_TABLET | Freq: Every day | ORAL | 13 refills | Status: DC

## 2019-02-02 MED ORDER — ASPIRIN 81 MG PO CHEW: 81.0000 mg | CHEWABLE_TABLET | Freq: Every day | ORAL | 5 refills | Status: DC

## 2019-02-02 MED ORDER — METOCLOPRAMIDE HCL 10 MG PO TABS: 10.0000 mg | ORAL_TABLET | Freq: Four times a day (QID) | ORAL | 0 refills | Status: DC

## 2019-02-02 NOTE — Progress Notes (Signed)
   Subjective:   Danielle Hoover is a 38 y.o. G5P3103 at [redacted]w[redacted]d by LMP being seen today for her first obstetrical visit.  Her obstetrical history is significant for advanced maternal age, chronic hypertension, & hx of 31 wk IUFD. Patient does intend to breast feed. Pregnancy history fully reviewed. Patient previously was on HCTZ for CHTN but discontinued with positive pregnancy test.  Her second pregnancy (2008) was a preterm delivery at [redacted]w[redacted]d for IUFD & severe preeclampsia. Her other deliveries were full term & all were vaginal.   Patient reports vaginal irritation. Reports vaginal irritation for the last few weeks. Has seen some thick white discharge. Irritation worse after voiding but doesn't report dysuria. States symptoms feel similar to a previous diagnosis of vaginal infection in 2016 (per review of records, patient tx for chlamydia at that time).   HISTORY: OB History  Gravida Para Term Preterm AB Living  5 4 3 1 0 3  SAB TAB Ectopic Multiple Live Births  0 0 0 0 3    # Outcome Date GA Lbr Len/2nd Weight Sex Delivery Anes PTL Lv  5 Current           4 Term 03/16/15 [redacted]w[redacted]d 09:44 / 00:10 7 lb 0.4 oz (3.185 kg) M Vag-Spont EPI  LIV     Apgar1: 8  Apgar5: 9  3 Term 05/05/11 [redacted]w[redacted]d  6 lb 14 oz (3.118 kg) M Vag-Spont   LIV  2 Preterm 05/20/07 [redacted]w[redacted]d    Vag-Spont  N FD     Birth Comments: IUFD/ no fetal heart activity U/S on 05/19/07     Complications: Hypertension in pregnancy, preeclampsia, severe, delivered/postpartum  1 Term 05/06/05 [redacted]w[redacted]d 24:00 6 lb 2 oz (2.778 kg) F Vag-Spont EPI  LIV     Birth Comments: anemia   Past Medical History:  Diagnosis Date  . Anemia    during pregnancy 2006  . Asthma   . Headache   . Hx of chlamydia infection 2016  . Hypertension    Past Surgical History:  Procedure Laterality Date  . NO PAST SURGERIES     Family History  Problem Relation Age of Onset  . Alcohol abuse Neg Hx   . Arthritis Neg Hx   . Asthma Neg Hx   . Birth defects Neg Hx   .  Cancer Neg Hx   . COPD Neg Hx   . Depression Neg Hx   . Diabetes Neg Hx   . Drug abuse Neg Hx   . Early death Neg Hx   . Hearing loss Neg Hx   . Heart disease Neg Hx   . Hyperlipidemia Neg Hx   . Hypertension Neg Hx   . Kidney disease Neg Hx   . Learning disabilities Neg Hx   . Mental illness Neg Hx   . Mental retardation Neg Hx   . Miscarriages / Stillbirths Neg Hx   . Stroke Neg Hx   . Vision loss Neg Hx   . Varicose Veins Neg Hx    Social History   Tobacco Use  . Smoking status: Never Smoker  . Smokeless tobacco: Never Used  Substance Use Topics  . Alcohol use: No  . Drug use: No   No Known Allergies Current Outpatient Medications on File Prior to Visit  Medication Sig Dispense Refill  . Multiple Vitamin (MULTIVITAMIN) tablet Take 1 tablet by mouth daily.    . hydrochlorothiazide (HYDRODIURIL) 12.5 MG tablet Take 1 tablet (12.5 mg total) by mouth daily. (  Patient not taking: Reported on 02/02/2019) 90 tablet 1   Current Facility-Administered Medications on File Prior to Visit  Medication Dose Route Frequency Provider Last Rate Last Dose  . medroxyPROGESTERone (DEPO-PROVERA) injection 150 mg  150 mg Intramuscular Q90 days Arnold, James G, MD   150 mg at 05/22/18 1507    Indications for ASA therapy (per uptodate) One of the following: Previous pregnancy with preeclampsia, especially early onset and with an adverse outcome Yes Multifetal gestation No Chronic hypertension Yes Type 1 or 2 diabetes mellitus No Chronic kidney disease No Autoimmune disease (antiphospholipid syndrome, systemic lupus erythematosus) No  Indications for early diabetes screening (per uptodate)  BMI >25 (>23 in Asian women) AND one of the following  Gestational diabetes mellitus in a previous pregnancy No Glycated hemoglobin ?5.7 percent (39 mmol/mol), impaired glucose tolerance, or impaired fasting glucose on previous testing No First-degree relative with diabetes No High-risk  race/ethnicity (eg, African American, Latino, Native American, Asian American, Pacific Islander) Yes History of cardiovascular disease No Hypertension or on therapy for hypertension Yes High-density lipoprotein cholesterol level <35 mg/dL (0.90 mmol/L) and/or a triglyceride level >250 mg/dL (2.82 mmol/L) No Polycystic ovary syndrome No Physical inactivity No Other clinical condition associated with insulin resistance (eg, severe obesity, acanthosis nigricans) No Previous birth of an infant weighing ?4000 g No Previous stillbirth of unknown cause Yes   Exam   Vitals:   02/02/19 1456  BP: 124/62  Pulse: (!) 102  Weight: 125 lb 12.8 oz (57.1 kg)   Fetal Heart Rate (bpm): 171  Uterus:   Uterus enlarged ~10 wks size  Pelvic Exam: Perineum: no hemorrhoids, normal perineum   Vulva: normal external genitalia, no lesions   Vagina:  normal mucosa, normal discharge   Cervix: no lesions    Adnexa: normal adnexa and no mass, fullness, tenderness   Bony Pelvis: average  System: General: well-developed, well-nourished female in no acute distress   Skin: normal coloration and turgor, no rashes   Neurologic: oriented, normal, negative, normal mood   Extremities: normal strength, tone, and muscle mass, ROM of all joints is normal   HEENT PERRLA, extraocular movement intact and sclera clear, anicteric   Mouth/Teeth mucous membranes moist, pharynx normal without lesions and dental hygiene good   Neck supple and no masses   Cardiovascular: regular rate and rhythm   Respiratory:  no respiratory distress, normal breath sounds   Abdomen: soft, non-tender; bowel sounds normal; no masses,  no organomegaly     Assessment:   Pregnancy: G5P3103 Patient Active Problem List   Diagnosis Date Noted  . Supervision of high risk pregnancy, antepartum 02/02/2019  . History of IUFD 02/02/2019  . Advanced maternal age in multigravida, first trimester 02/02/2019  . Nausea and vomiting during pregnancy prior  to [redacted] weeks gestation 02/02/2019  . Chronic hypertension complicating or reason for care during pregnancy, first trimester 02/02/2019  . Language barrier 10/10/2014     Plan:  1. Supervision of high risk pregnancy, antepartum  - Culture, OB Urine - Obstetric Panel, Including HIV - SMN1 COPY NUMBER ANALYSIS (SMA Carrier Screen) - US MFM OB DETAIL +14 WK; Future - Comp Met (CMET) - Protein / creatinine ratio, urine - Cervicovaginal ancillary only( [place]) - Hemoglobin A1c - Prenatal Vit-Fe Fumarate-FA (PREPLUS) 27-1 MG TABS; Take 1 tablet by mouth daily.  Dispense: 30 tablet; Refill: 13  2. History of IUFD -31 wk IUFD & severe preeclampsia  3. Advanced maternal age in multigravida, first trimester -genetic counseling with   anatomy ultrasound - Hemoglobin A1c  4. Chronic hypertension complicating or reason for care during pregnancy, first trimester -patient instructed to start baby ASA in 2 weeks -sent home with BP cuff - Comp Met (CMET) - Protein / creatinine ratio, urine - Hemoglobin A1c - aspirin 81 MG chewable tablet; Chew 1 tablet (81 mg total) by mouth daily.  Dispense: 30 tablet; Refill: 5  5. Language barrier Mike Gip interpreter at bedside for exam  6. Nausea and vomiting during pregnancy prior to [redacted] weeks gestation  - metoCLOPramide (REGLAN) 10 MG tablet; Take 1 tablet (10 mg total) by mouth every 6 (six) hours.  Dispense: 30 tablet; Refill: 0   Initial labs drawn. Hgb A1C today Started on ASA -- rx sent, pt to start at 12 wks Flu vax today Continue prenatal vitamins. Genetic Screening discussed, NIPS: requested. Will bring back for genetic screening.  Ultrasound discussed; fetal anatomic survey: ordered. Problem list reviewed and updated. The nature of [facility] with multiple MDs and other Advanced Practice Providers was explained to patient; also emphasized that residents, students are part of our team. Routine  obstetric precautions reviewed. Return in about 4 weeks (around 03/02/2019) for High Risk OB with MD & genetic screening.   Jorje Guild 4:52 PM 02/02/19

## 2019-02-02 NOTE — Progress Notes (Signed)
Using in person interperter.  Pt given and shown how to use BP cuff, including operation of the BP machine, placement of the cuff and proper body position.  Pt instructed to take BP weekly and given written instruction to call if BP greater than 140/90.  Pt showed on the machine where those numbers are located.  Pt verbalized understanding of all teaching.

## 2019-02-02 NOTE — Patient Instructions (Signed)
Hypertension During Pregnancy ° °Hypertension, commonly called high blood pressure, is when the force of blood pumping through your arteries is too strong. Arteries are blood vessels that carry blood from the heart throughout the body. Hypertension during pregnancy can cause problems for you and your baby. Your baby may be born early (prematurely) or may not weigh as much as he or she should at birth. Very bad cases of hypertension during pregnancy can be life-threatening. °Different types of hypertension can occur during pregnancy. These include: °· Chronic hypertension. This happens when: °? You have hypertension before pregnancy and it continues during pregnancy. °? You develop hypertension before you are [redacted] weeks pregnant, and it continues during pregnancy. °· Gestational hypertension. This is hypertension that develops after the 20th week of pregnancy. °· Preeclampsia, also called toxemia of pregnancy. This is a very serious type of hypertension that develops during pregnancy. It can be very dangerous for you and your baby. °? In rare cases, you may develop preeclampsia after giving birth (postpartum preeclampsia). This usually occurs within 48 hours after childbirth but may occur up to 6 weeks after giving birth. °Gestational hypertension and preeclampsia usually go away within 6 weeks after your baby is born. Women who have hypertension during pregnancy have a greater chance of developing hypertension later in life or during future pregnancies. °What are the causes? °The exact cause of hypertension during pregnancy is not known. °What increases the risk? °There are certain factors that make it more likely for you to develop hypertension during pregnancy. These include: °· Having hypertension during a previous pregnancy or prior to pregnancy. °· Being overweight. °· Being age 35 or older. °· Being pregnant for the first time. °· Being pregnant with more than one baby. °· Becoming pregnant using fertilization  methods such as IVF (in vitro fertilization). °· Having diabetes, kidney problems, or systemic lupus erythematosus. °· Having a family history of hypertension. °What are the signs or symptoms? °Chronic hypertension and gestational hypertension rarely cause symptoms. Preeclampsia causes symptoms, which may include: °· Increased protein in your urine. Your health care provider will check for this at every visit before you give birth (prenatal visit). °· Severe headaches. °· Sudden weight gain. °· Swelling of the hands, face, legs, and feet. °· Nausea and vomiting. °· Vision problems, such as blurred or double vision. °· Numbness in the face, arms, legs, and feet. °· Dizziness. °· Slurred speech. °· Sensitivity to bright lights. °· Abdominal pain. °· Convulsions or seizures. °How is this diagnosed? °You may be diagnosed with hypertension during a routine prenatal exam. At each prenatal visit, you may: °· Have a urine test to check for high amounts of protein in your urine. °· Have your blood pressure checked. A blood pressure reading is given as two numbers, such as "120 over 80" (or 120/80). The first ("top") number is a measure of the pressure in your arteries when your heart beats (systolic pressure). The second ("bottom") number is a measure of the pressure in your arteries as your heart relaxes between beats (diastolic pressure). Blood pressure is measured in a unit called mm Hg. For most women, a normal blood pressure reading is: °? Systolic: below 120. °? Diastolic: below 80. °The type of hypertension that you are diagnosed with depends on your test results and when your symptoms developed. °· Chronic hypertension is usually diagnosed before 20 weeks of pregnancy. °· Gestational hypertension is usually diagnosed after 20 weeks of pregnancy. °· Hypertension with high amounts of protein in   the urine is diagnosed as preeclampsia. °· Blood pressure measurements that stay above 160 systolic, or above 110 diastolic,  are signs of severe preeclampsia. °How is this treated? °Treatment for hypertension during pregnancy varies depending on the type of hypertension you have and how serious it is. °· If you take medicines called ACE inhibitors to treat chronic hypertension, you may need to switch medicines. ACE inhibitors should not be taken during pregnancy. °· If you have gestational hypertension, you may need to take blood pressure medicine. °· If you are at risk for preeclampsia, your health care provider may recommend that you take a low-dose aspirin during your pregnancy. °· If you have severe preeclampsia, you may need to be hospitalized so you and your baby can be monitored closely. You may also need to take medicine (magnesium sulfate) to prevent seizures and to lower blood pressure. This medicine may be given as an injection or through an IV. °· In some cases, if your condition gets worse, you may need to deliver your baby early. °Follow these instructions at home: °Eating and drinking ° °· Drink enough fluid to keep your urine pale yellow. °· Avoid caffeine. °Lifestyle °· Do not use any products that contain nicotine or tobacco, such as cigarettes and e-cigarettes. If you need help quitting, ask your health care provider. °· Do not use alcohol or drugs. °· Avoid stress as much as possible. Rest and get plenty of sleep. °General instructions °· Take over-the-counter and prescription medicines only as told by your health care provider. °· While lying down, lie on your left side. This keeps pressure off your major blood vessels. °· While sitting or lying down, raise (elevate) your feet. Try putting some pillows under your lower legs. °· Exercise regularly. Ask your health care provider what kinds of exercise are best for you. °· Keep all prenatal and follow-up visits as told by your health care provider. This is important. °Contact a health care provider if: °· You have symptoms that your health care provider told you may  require more treatment or monitoring, such as: °? Nausea or vomiting. °? Headache. °Get help right away if you have: °· Severe abdominal pain that does not get better with treatment. °· A severe headache that does not get better. °· Vomiting that does not get better. °· Sudden, rapid weight gain. °· Sudden swelling in your hands, ankles, or face. °· Vaginal bleeding. °· Blood in your urine. °· Fewer movements from your baby than usual. °· Blurred or double vision. °· Muscle twitching or sudden muscle tightening (spasms). °· Shortness of breath. °· Blue fingernails or lips. °Summary °· Hypertension, commonly called high blood pressure, is when the force of blood pumping through your arteries is too strong. °· Hypertension during pregnancy can cause problems for you and your baby. °· Treatment for hypertension during pregnancy varies depending on the type of hypertension you have and how serious it is. °· Get help right away if you have symptoms that your health care provider told you to watch for. °This information is not intended to replace advice given to you by your health care provider. Make sure you discuss any questions you have with your health care provider. °Document Released: 07/09/2011 Document Revised: 10/07/2017 Document Reviewed: 04/05/2016 °Elsevier Interactive Patient Education © 2019 [employer] Inc[employer] ° °

## 2019-02-03 LAB — CERVICOVAGINAL ANCILLARY ONLY
BACTERIAL VAGINITIS: NEGATIVE
Candida vaginitis: POSITIVE — AB
Chlamydia: NEGATIVE
Neisseria Gonorrhea: NEGATIVE
TRICH (WINDOWPATH): NEGATIVE

## 2019-02-03 LAB — PROTEIN / CREATININE RATIO, URINE
Creatinine, Urine: 57.7 mg/dL
Protein, Ur: 27.8 mg/dL
Protein/Creat Ratio: 482 mg/g creat — ABNORMAL HIGH (ref 0–200)

## 2019-02-04 ENCOUNTER — Telehealth: Payer: Self-pay | Admitting: *Deleted

## 2019-02-04 MED ORDER — TERCONAZOLE 0.8 % VA CREA: 1.0000 | TOPICAL_CREAM | Freq: Every day | VAGINAL | 0 refills | Status: DC

## 2019-02-04 NOTE — Addendum Note (Signed)
Addended by: Judeth Horn B on: 02/04/2019 08:11 AM   Modules accepted: Orders

## 2019-02-04 NOTE — Telephone Encounter (Addendum)
-----   Message from Judeth Horn, NP sent at 02/04/2019  8:11 AM EDT ----- Can you use the jarai interpreter to call the patient about her swab. She has yeast. I am sending terazol to her pharmacy. Thanks!  4/2  1445  Pt was contacted via phone and informed of test results as well as medication prescribed. Estanislado Spire interpreter was not available however pt does understand some [place]. She voiced understanding and will obtain medication today.

## 2019-02-06 LAB — URINE CULTURE, OB REFLEX

## 2019-02-06 LAB — CULTURE, OB URINE

## 2019-02-07 ENCOUNTER — Encounter: Payer: Self-pay | Admitting: Student

## 2019-02-07 DIAGNOSIS — R8271 Bacteriuria: Secondary | ICD-10-CM | POA: Insufficient documentation

## 2019-02-09 LAB — OBSTETRIC PANEL, INCLUDING HIV
BASOS: 0 %
Basophils Absolute: 0 10*3/uL (ref 0.0–0.2)
EOS (ABSOLUTE): 0.1 10*3/uL (ref 0.0–0.4)
EOS: 1 %
HEMOGLOBIN: 13.1 g/dL (ref 11.1–15.9)
HIV Screen 4th Generation wRfx: NONREACTIVE
Hematocrit: 38.8 % (ref 34.0–46.6)
Hepatitis B Surface Ag: NEGATIVE
Immature Grans (Abs): 0 10*3/uL (ref 0.0–0.1)
Immature Granulocytes: 0 %
Lymphocytes Absolute: 1.8 10*3/uL (ref 0.7–3.1)
Lymphs: 20 %
MCH: 26.2 pg — ABNORMAL LOW (ref 26.6–33.0)
MCHC: 33.8 g/dL (ref 31.5–35.7)
MCV: 78 fL — ABNORMAL LOW (ref 79–97)
MONOS ABS: 0.7 10*3/uL (ref 0.1–0.9)
Monocytes: 8 %
Neutrophils Absolute: 6.1 10*3/uL (ref 1.4–7.0)
Neutrophils: 71 %
Platelets: 247 10*3/uL (ref 150–450)
RBC: 5 x10E6/uL (ref 3.77–5.28)
RDW: 13.3 % (ref 11.7–15.4)
RPR Ser Ql: NONREACTIVE
Rh Factor: POSITIVE
Rubella Antibodies, IGG: 1.87 index (ref >0.99)
WBC: 8.7 10*3/uL (ref 3.4–10.8)

## 2019-02-09 LAB — AB SCR+ANTIBODY ID: Antibody Screen: POSITIVE — AB

## 2019-02-09 LAB — COMPREHENSIVE METABOLIC PANEL
ALT: 13 IU/L (ref 0–32)
AST: 22 IU/L (ref 0–40)
Albumin/Globulin Ratio: 1.4 (ref 1.2–2.2)
Albumin: 3.8 g/dL (ref 3.8–4.8)
Alkaline Phosphatase: 51 IU/L (ref 39–117)
BUN/Creatinine Ratio: 18 (ref 9–23)
BUN: 9 mg/dL (ref 6–20)
Bilirubin Total: 0.4 mg/dL (ref 0.0–1.2)
CO2: 21 mmol/L (ref 20–29)
Calcium: 9.4 mg/dL (ref 8.7–10.2)
Chloride: 100 mmol/L (ref 96–106)
Creatinine, Ser: 0.5 mg/dL — ABNORMAL LOW (ref 0.57–1.00)
GFR calc Af Amer: 144 mL/min/{1.73_m2} (ref >59)
GFR calc non Af Amer: 125 mL/min/{1.73_m2} (ref >59)
GLUCOSE: 101 mg/dL — AB (ref 65–99)
Globulin, Total: 2.8 g/dL (ref 1.5–4.5)
Potassium: 3.8 mmol/L (ref 3.5–5.2)
Sodium: 137 mmol/L (ref 134–144)
Total Protein: 6.6 g/dL (ref 6.0–8.5)

## 2019-02-09 LAB — SMN1 COPY NUMBER ANALYSIS (SMA CARRIER SCREENING)

## 2019-02-09 LAB — HEMOGLOBIN A1C
Est. average glucose Bld gHb Est-mCnc: 91 mg/dL
Hgb A1c MFr Bld: 4.8 % (ref 4.8–5.6)

## 2019-02-11 ENCOUNTER — Encounter: Payer: Self-pay | Admitting: Student

## 2019-02-11 DIAGNOSIS — O361990 Maternal care for other isoimmunization, unspecified trimester, not applicable or unspecified: Secondary | ICD-10-CM | POA: Insufficient documentation

## 2019-03-02 ENCOUNTER — Other Ambulatory Visit: Payer: Self-pay

## 2019-03-02 ENCOUNTER — Ambulatory Visit (INDEPENDENT_AMBULATORY_CARE_PROVIDER_SITE_OTHER): Payer: BLUE CROSS/BLUE SHIELD | Admitting: Obstetrics & Gynecology

## 2019-03-02 ENCOUNTER — Encounter: Payer: BLUE CROSS/BLUE SHIELD | Admitting: Internal Medicine

## 2019-03-02 VITALS — BP 110/58 | HR 80 | Wt 121.5 lb

## 2019-03-02 DIAGNOSIS — O0990 Supervision of high risk pregnancy, unspecified, unspecified trimester: Secondary | ICD-10-CM

## 2019-03-02 DIAGNOSIS — O09522 Supervision of elderly multigravida, second trimester: Secondary | ICD-10-CM | POA: Diagnosis not present

## 2019-03-02 DIAGNOSIS — O10919 Unspecified pre-existing hypertension complicating pregnancy, unspecified trimester: Secondary | ICD-10-CM

## 2019-03-02 DIAGNOSIS — Z23 Encounter for immunization: Secondary | ICD-10-CM | POA: Diagnosis not present

## 2019-03-02 DIAGNOSIS — Z8759 Personal history of other complications of pregnancy, childbirth and the puerperium: Secondary | ICD-10-CM

## 2019-03-02 DIAGNOSIS — Z3A14 14 weeks gestation of pregnancy: Secondary | ICD-10-CM

## 2019-03-02 NOTE — Patient Instructions (Signed)

## 2019-03-02 NOTE — Progress Notes (Signed)
   PRENATAL VISIT NOTE  Subjective:  Danielle Hoover is a 38 y.o. [id number] at [redacted]w[redacted]d being seen today for ongoing prenatal care. Due to language barrier, a telephone [place] interpreter was present during the history-taking and subsequent discussion and for part of the physical exam with this patient.  She is currently monitored for the following issues for this high-risk pregnancy and has Language barrier; Supervision of high risk pregnancy, antepartum; History of IUFD; Advanced maternal age in multigravida; Chronic hypertension complicating pregnancy, antepartum; GBS bacteriuria; and Lewis isoimmunization during pregnancy on their problem list.  Patient reports no complaints.  Contractions: Not present. Vag. Bleeding: None.  Movement: Absent. Denies leaking of fluid.   The following portions of the patient's history were reviewed and updated as appropriate: allergies, current medications, past family history, past medical history, past social history, past surgical history and problem list.   Objective:   Vitals:   03/02/19 0959  BP: (!) 110/58  Pulse: 80  Weight: 121 lb 8 oz (55.1 kg)    Fetal Status: Fetal Heart Rate (bpm): 158   Movement: Absent     General:  Alert, oriented and cooperative. Patient is in no acute distress.  Skin: Skin is warm and dry. No rash noted.   Cardiovascular: Normal heart rate noted  Respiratory: Normal respiratory effort, no problems with respiration noted  Abdomen: Soft, gravid, appropriate for gestational age.  Pain/Pressure: Absent     Pelvic: Cervical exam deferred        Extremities: Normal range of motion.  Edema: None  Mental Status: Normal mood and affect. Normal behavior. Normal judgment and thought content.   Assessment and Plan:  Pregnancy: [id number] at [redacted]w[redacted]d 1. Multigravida of advanced maternal age in second trimester Panorama ordered. AFP to be done on the day of her anatomy ultrasound. - Genetic Screening - AFP, Serum, Open Spina Bifida; Future   2. Chronic hypertension complicating pregnancy, antepartum BP stable, continue ASA and BP monitoring  3. History of IUFD Antenatal screening and serial scans as per MFM recommendations  4. Supervision of high risk pregnancy, antepartum - Flu Vaccine QUAD 36+ mos IM No other complaints or concerns.  Routine obstetric precautions reviewed. Please refer to After Visit Summary for other counseling recommendations.   Return in about 8 weeks (around 04/27/2019) for Virtual OB Visit.  Lab visit for AFP only on 04/06/19 before or after MFM ultrasound..  Future Appointments  Date Time Provider Department Center  04/06/2019  1:50 PM [facility] LAB [facility] WOC  04/06/2019  2:15 PM [facility] NURSE [facility]  04/06/2019  2:15 PM [facility] [place] 4 [facility]US M[facility]-US    Jaynie Collins, MD

## 2019-03-02 NOTE — Progress Notes (Signed)
[place] interpreter used via telephone for visit.    Anatomy ultrasound scheduled for June 2nd @ 1415.  Flu shot given today. Pt tolerated well.

## 2019-03-04 ENCOUNTER — Encounter: Payer: Self-pay | Admitting: *Deleted

## 2019-04-06 ENCOUNTER — Ambulatory Visit (HOSPITAL_COMMUNITY): Payer: BLUE CROSS/BLUE SHIELD | Admitting: *Deleted

## 2019-04-06 ENCOUNTER — Other Ambulatory Visit: Payer: BLUE CROSS/BLUE SHIELD

## 2019-04-06 ENCOUNTER — Encounter (HOSPITAL_COMMUNITY): Payer: Self-pay

## 2019-04-06 ENCOUNTER — Other Ambulatory Visit: Payer: Self-pay

## 2019-04-06 ENCOUNTER — Ambulatory Visit (HOSPITAL_COMMUNITY)
Admission: RE | Admit: 2019-04-06 | Discharge: 2019-04-06 | Disposition: A | Discharge status: Home / Self Care | Payer: BLUE CROSS/BLUE SHIELD | Source: Ambulatory Visit | Attending: Obstetrics and Gynecology | Admitting: Obstetrics and Gynecology

## 2019-04-06 ENCOUNTER — Other Ambulatory Visit: Payer: Self-pay | Admitting: General Practice

## 2019-04-06 VITALS — BP 98/53 | HR 76 | Temp 98.5°F

## 2019-04-06 DIAGNOSIS — O10012 Pre-existing essential hypertension complicating pregnancy, second trimester: Secondary | ICD-10-CM

## 2019-04-06 DIAGNOSIS — O09522 Supervision of elderly multigravida, second trimester: Secondary | ICD-10-CM

## 2019-04-06 DIAGNOSIS — O0990 Supervision of high risk pregnancy, unspecified, unspecified trimester: Secondary | ICD-10-CM | POA: Insufficient documentation

## 2019-04-06 DIAGNOSIS — Z363 Encounter for antenatal screening for malformations: Secondary | ICD-10-CM | POA: Diagnosis not present

## 2019-04-06 DIAGNOSIS — R8271 Bacteriuria: Secondary | ICD-10-CM

## 2019-04-06 DIAGNOSIS — O09529 Supervision of elderly multigravida, unspecified trimester: Secondary | ICD-10-CM | POA: Diagnosis not present

## 2019-04-06 DIAGNOSIS — O09292 Supervision of pregnancy with other poor reproductive or obstetric history, second trimester: Secondary | ICD-10-CM

## 2019-04-06 DIAGNOSIS — Z3A19 19 weeks gestation of pregnancy: Secondary | ICD-10-CM

## 2019-04-08 LAB — AFP, SERUM, OPEN SPINA BIFIDA
AFP MoM: 1.37
AFP Value: 76.7 ng/mL
Gest. Age on Collection Date: 19 weeks
Maternal Age At EDD: 36.8 yr
OSBR Risk 1 IN: 3920
Test Results:: NEGATIVE
Weight: 122 [lb_av]

## 2019-04-27 ENCOUNTER — Ambulatory Visit (INDEPENDENT_AMBULATORY_CARE_PROVIDER_SITE_OTHER): Payer: BC Managed Care – PPO | Admitting: Obstetrics and Gynecology

## 2019-04-27 ENCOUNTER — Other Ambulatory Visit: Payer: Self-pay

## 2019-04-27 DIAGNOSIS — O09522 Supervision of elderly multigravida, second trimester: Secondary | ICD-10-CM

## 2019-04-27 DIAGNOSIS — Z3A22 22 weeks gestation of pregnancy: Secondary | ICD-10-CM

## 2019-04-27 DIAGNOSIS — O09212 Supervision of pregnancy with history of pre-term labor, second trimester: Secondary | ICD-10-CM

## 2019-04-27 DIAGNOSIS — O0992 Supervision of high risk pregnancy, unspecified, second trimester: Secondary | ICD-10-CM

## 2019-04-27 DIAGNOSIS — Z8759 Personal history of other complications of pregnancy, childbirth and the puerperium: Secondary | ICD-10-CM

## 2019-04-27 DIAGNOSIS — O0990 Supervision of high risk pregnancy, unspecified, unspecified trimester: Secondary | ICD-10-CM

## 2019-04-27 DIAGNOSIS — O361920 Maternal care for other isoimmunization, second trimester, not applicable or unspecified: Secondary | ICD-10-CM

## 2019-04-27 DIAGNOSIS — R8271 Bacteriuria: Secondary | ICD-10-CM

## 2019-04-27 DIAGNOSIS — O10919 Unspecified pre-existing hypertension complicating pregnancy, unspecified trimester: Secondary | ICD-10-CM

## 2019-04-27 DIAGNOSIS — O10912 Unspecified pre-existing hypertension complicating pregnancy, second trimester: Secondary | ICD-10-CM

## 2019-04-27 NOTE — Progress Notes (Signed)
Pt was informed of OB ultrasound scheduled for 7/7 @ 3:30. Pt had no questions or concerns.

## 2019-04-27 NOTE — Progress Notes (Signed)
      TELEHEALTH VIRTUAL OBSTETRICS VISIT ENCOUNTER NOTE  I connected with Danielle Hoover on 04/27/19 at  9:35 AM EDT by telephone at home and verified that I am speaking with the correct person using two identifiers.   I discussed the limitations, risks, security and privacy concerns of performing an evaluation and management service by telephone and the availability of in person appointments. I also discussed with the patient that there may be a patient responsible charge related to this service. The patient expressed understanding and agreed to proceed.  Subjective:  Danielle Hoover is a 38 y.o. [id number] at [redacted]w[redacted]d being followed for ongoing prenatal care.  She is currently monitored for the following issues for this high-risk pregnancy and has Language barrier; Supervision of high risk pregnancy, antepartum; History of IUFD; Advanced maternal age in multigravida; Chronic hypertension complicating pregnancy, antepartum; GBS bacteriuria; and Lewis isoimmunization during pregnancy on their problem list.  Patient reports no complaints. Reports fetal movement. Denies any contractions, bleeding or leaking of fluid.   The following portions of the patient's history were reviewed and updated as appropriate: allergies, current medications, past family history, past medical history, past social history, past surgical history and problem list.   Objective:  There were no vitals filed for this visit.  Babyscripts Data Reviewed: no  General:  Alert, oriented and cooperative.   Mental Status: Normal mood and affect perceived. Normal judgment and thought content.  Rest of physical exam deferred due to type of encounter  Assessment and Plan:  Pregnancy: [id number] at [redacted]w[redacted]d *CHTN: on no meds. Confirms low dose asa. Unable to do BPs. Will have her come back for in person visit same day as f/u growth *Pregnancy: ask about btl papers nv *AMA: no issues *History of IUFD: serial growth u/s. Consider ap testing starting at  30wks *GBS urine: toc nv  Interpreter used  Preterm labor symptoms and general obstetric precautions including but not limited to vaginal bleeding, contractions, leaking of fluid and fetal movement were reviewed in detail with the patient.  I discussed the assessment and treatment plan with the patient. The patient was provided an opportunity to ask questions and all were answered. The patient agreed with the plan and demonstrated an understanding of the instructions. The patient was advised to call back or seek an in-person office evaluation/go to [facility] at [facility] for any urgent or concerning symptoms. Please refer to After Visit Summary for other counseling recommendations.   I provided 10 minutes of non-face-to-face time during this encounter. The visit was conducted via Phone-medicine (patient unable to download Webex)  Return in about 3 weeks (around 05/18/2019).  No future appointments.  Aletha Halim, MD [facility] [place]

## 2019-05-11 ENCOUNTER — Encounter (HOSPITAL_COMMUNITY): Payer: Self-pay | Admitting: *Deleted

## 2019-05-11 ENCOUNTER — Encounter: Payer: Medicaid Other | Admitting: Obstetrics and Gynecology

## 2019-05-11 ENCOUNTER — Other Ambulatory Visit: Payer: Self-pay

## 2019-05-11 ENCOUNTER — Ambulatory Visit (HOSPITAL_COMMUNITY): Payer: BC Managed Care – PPO | Admitting: *Deleted

## 2019-05-11 ENCOUNTER — Ambulatory Visit (HOSPITAL_COMMUNITY)
Admission: RE | Admit: 2019-05-11 | Discharge: 2019-05-11 | Disposition: A | Discharge status: Home / Self Care | Payer: BC Managed Care – PPO | Source: Ambulatory Visit | Attending: Obstetrics and Gynecology | Admitting: Obstetrics and Gynecology

## 2019-05-11 DIAGNOSIS — R8271 Bacteriuria: Secondary | ICD-10-CM | POA: Insufficient documentation

## 2019-05-11 DIAGNOSIS — O09522 Supervision of elderly multigravida, second trimester: Secondary | ICD-10-CM

## 2019-05-11 DIAGNOSIS — O0990 Supervision of high risk pregnancy, unspecified, unspecified trimester: Secondary | ICD-10-CM | POA: Diagnosis not present

## 2019-05-11 DIAGNOSIS — O10919 Unspecified pre-existing hypertension complicating pregnancy, unspecified trimester: Secondary | ICD-10-CM | POA: Insufficient documentation

## 2019-05-11 DIAGNOSIS — O361920 Maternal care for other isoimmunization, second trimester, not applicable or unspecified: Secondary | ICD-10-CM | POA: Insufficient documentation

## 2019-05-11 DIAGNOSIS — Z8759 Personal history of other complications of pregnancy, childbirth and the puerperium: Secondary | ICD-10-CM

## 2019-05-11 DIAGNOSIS — O09292 Supervision of pregnancy with other poor reproductive or obstetric history, second trimester: Secondary | ICD-10-CM | POA: Diagnosis not present

## 2019-05-11 DIAGNOSIS — O10012 Pre-existing essential hypertension complicating pregnancy, second trimester: Secondary | ICD-10-CM

## 2019-05-11 DIAGNOSIS — Z362 Encounter for other antenatal screening follow-up: Secondary | ICD-10-CM

## 2019-05-11 DIAGNOSIS — Z3A24 24 weeks gestation of pregnancy: Secondary | ICD-10-CM

## 2019-05-12 ENCOUNTER — Other Ambulatory Visit (HOSPITAL_COMMUNITY): Payer: Self-pay | Admitting: *Deleted

## 2019-05-12 ENCOUNTER — Encounter: Payer: Self-pay | Admitting: Obstetrics and Gynecology

## 2019-05-12 DIAGNOSIS — O10919 Unspecified pre-existing hypertension complicating pregnancy, unspecified trimester: Secondary | ICD-10-CM

## 2019-05-12 DIAGNOSIS — Z87898 Personal history of other specified conditions: Secondary | ICD-10-CM | POA: Insufficient documentation

## 2019-05-19 ENCOUNTER — Other Ambulatory Visit: Payer: Self-pay

## 2019-05-19 ENCOUNTER — Encounter: Payer: Self-pay | Admitting: Obstetrics and Gynecology

## 2019-05-19 ENCOUNTER — Ambulatory Visit (INDEPENDENT_AMBULATORY_CARE_PROVIDER_SITE_OTHER): Payer: Medicaid Other | Admitting: Obstetrics and Gynecology

## 2019-05-19 VITALS — BP 110/60 | HR 98 | Temp 98.4°F | Wt 129.3 lb

## 2019-05-19 DIAGNOSIS — O0992 Supervision of high risk pregnancy, unspecified, second trimester: Secondary | ICD-10-CM

## 2019-05-19 DIAGNOSIS — O09292 Supervision of pregnancy with other poor reproductive or obstetric history, second trimester: Secondary | ICD-10-CM

## 2019-05-19 DIAGNOSIS — R8271 Bacteriuria: Secondary | ICD-10-CM

## 2019-05-19 DIAGNOSIS — O365990 Maternal care for other known or suspected poor fetal growth, unspecified trimester, not applicable or unspecified: Secondary | ICD-10-CM

## 2019-05-19 DIAGNOSIS — O365920 Maternal care for other known or suspected poor fetal growth, second trimester, not applicable or unspecified: Secondary | ICD-10-CM

## 2019-05-19 DIAGNOSIS — O10919 Unspecified pre-existing hypertension complicating pregnancy, unspecified trimester: Secondary | ICD-10-CM

## 2019-05-19 DIAGNOSIS — O10912 Unspecified pre-existing hypertension complicating pregnancy, second trimester: Secondary | ICD-10-CM

## 2019-05-19 DIAGNOSIS — O09522 Supervision of elderly multigravida, second trimester: Secondary | ICD-10-CM

## 2019-05-19 DIAGNOSIS — O99820 Streptococcus B carrier state complicating pregnancy: Secondary | ICD-10-CM

## 2019-05-19 DIAGNOSIS — Z3A25 25 weeks gestation of pregnancy: Secondary | ICD-10-CM

## 2019-05-19 DIAGNOSIS — O0990 Supervision of high risk pregnancy, unspecified, unspecified trimester: Secondary | ICD-10-CM

## 2019-05-19 DIAGNOSIS — Z8759 Personal history of other complications of pregnancy, childbirth and the puerperium: Secondary | ICD-10-CM

## 2019-05-19 DIAGNOSIS — O361920 Maternal care for other isoimmunization, second trimester, not applicable or unspecified: Secondary | ICD-10-CM

## 2019-05-19 DIAGNOSIS — Z789 Other specified health status: Secondary | ICD-10-CM

## 2019-05-19 NOTE — Progress Notes (Signed)
   PRENATAL VISIT NOTE  Subjective:  Danielle Hoover is a 38 y.o. [id number] at [redacted]w[redacted]d being seen today for ongoing prenatal care.  She is currently monitored for the following issues for this high-risk pregnancy and has Language barrier; Supervision of high risk pregnancy, antepartum; History of IUFD; Advanced maternal age in multigravida; Chronic hypertension complicating pregnancy, antepartum; GBS bacteriuria; Lewis isoimmunization during pregnancy; and Fetal growth retardation, antenatal on their problem list.  Patient reports no complaints.  Contractions: Not present. Vag. Bleeding: None.  Movement: Present. Denies leaking of fluid.   The following portions of the patient's history were reviewed and updated as appropriate: allergies, current medications, past family history, past medical history, past social history, past surgical history and problem list.   Objective:   Vitals:   05/19/19 1512  BP: 110/60  Pulse: 98  Temp: 98.4 F (36.9 C)  Weight: 129 lb 4.8 oz (58.7 kg)    Fetal Status: Fetal Heart Rate (bpm): 147   Movement: Present     General:  Alert, oriented and cooperative. Patient is in no acute distress.  Skin: Skin is warm and dry. No rash noted.   Cardiovascular: Normal heart rate noted  Respiratory: Normal respiratory effort, no problems with respiration noted  Abdomen: Soft, gravid, appropriate for gestational age.  Pain/Pressure: Absent     Pelvic: Cervical exam deferred        Extremities: Normal range of motion.  Edema: Trace  Mental Status: Normal mood and affect. Normal behavior. Normal judgment and thought content.   Assessment and Plan:  Pregnancy: L2G4010 at [redacted]w[redacted]d  1. Supervision of high risk pregnancy, antepartum  2. History of IUFD Testing @ 28 weeks  3. Language barrier Probation officer used  4. GBS bacteriuria ppx in labor  5. Multigravida of advanced maternal age in second trimester  6. Lewis isoimmunization during pregnancy in second trimester,  single or unspecified fetus  7. Fetal growth retardation, antenatal Noted on [place] on 05/11/19, suspected to be congenitally small - has repeat US for 06/01/19  8. Chronic hypertension complicating pregnancy, antepartum - stable, no meds - Cont baby ASA   Preterm labor symptoms and general obstetric precautions including but not limited to vaginal bleeding, contractions, leaking of fluid and fetal movement were reviewed in detail with the patient. Please refer to After Visit Summary for other counseling recommendations.   Return in about 2 weeks (around 06/02/2019) for OB visit (MD), in person, 3rd trim labs, 2 hr GTT.  Future Appointments  Date Time Provider [place]  06/01/2019  3:30 PM [facility] NURSE [facility] MFC-US  06/01/2019  3:30 PM [place] 5 WH-MFCUS MFC-US    Sloan Leiter, MD

## 2019-05-27 ENCOUNTER — Other Ambulatory Visit: Payer: Self-pay

## 2019-05-27 ENCOUNTER — Inpatient Hospital Stay (HOSPITAL_COMMUNITY)
Admission: AD | Admit: 2019-05-27 | Discharge: 2019-05-27 | Disposition: A | Discharge status: Home / Self Care | Payer: BC Managed Care – PPO | Attending: Obstetrics and Gynecology | Admitting: Obstetrics and Gynecology

## 2019-05-27 ENCOUNTER — Encounter (HOSPITAL_COMMUNITY): Payer: Self-pay

## 2019-05-27 DIAGNOSIS — O368121 Decreased fetal movements, second trimester, fetus 1: Secondary | ICD-10-CM | POA: Diagnosis not present

## 2019-05-27 DIAGNOSIS — Z3A29 29 weeks gestation of pregnancy: Secondary | ICD-10-CM

## 2019-05-27 DIAGNOSIS — Z789 Other specified health status: Secondary | ICD-10-CM

## 2019-05-27 NOTE — MAU Note (Signed)
Pt states that she has not felt the baby move at all in 2 days.     Denies vaginal bleeding.  Denies LOF.

## 2019-05-27 NOTE — Discharge Instructions (Signed)

## 2019-06-01 ENCOUNTER — Other Ambulatory Visit: Payer: Self-pay

## 2019-06-01 ENCOUNTER — Ambulatory Visit (HOSPITAL_COMMUNITY): Payer: BC Managed Care – PPO | Admitting: *Deleted

## 2019-06-01 ENCOUNTER — Ambulatory Visit (HOSPITAL_COMMUNITY)
Admission: RE | Admit: 2019-06-01 | Discharge: 2019-06-01 | Disposition: A | Discharge status: Home / Self Care | Payer: BC Managed Care – PPO | Source: Ambulatory Visit | Attending: Obstetrics and Gynecology | Admitting: Obstetrics and Gynecology

## 2019-06-01 ENCOUNTER — Encounter (HOSPITAL_COMMUNITY): Payer: Self-pay | Admitting: *Deleted

## 2019-06-01 DIAGNOSIS — O10919 Unspecified pre-existing hypertension complicating pregnancy, unspecified trimester: Secondary | ICD-10-CM | POA: Diagnosis not present

## 2019-06-01 DIAGNOSIS — Z362 Encounter for other antenatal screening follow-up: Secondary | ICD-10-CM | POA: Diagnosis not present

## 2019-06-01 DIAGNOSIS — O09522 Supervision of elderly multigravida, second trimester: Secondary | ICD-10-CM | POA: Insufficient documentation

## 2019-06-01 DIAGNOSIS — O361920 Maternal care for other isoimmunization, second trimester, not applicable or unspecified: Secondary | ICD-10-CM | POA: Diagnosis not present

## 2019-06-01 DIAGNOSIS — O09292 Supervision of pregnancy with other poor reproductive or obstetric history, second trimester: Secondary | ICD-10-CM | POA: Diagnosis not present

## 2019-06-01 DIAGNOSIS — R8271 Bacteriuria: Secondary | ICD-10-CM | POA: Diagnosis not present

## 2019-06-01 DIAGNOSIS — O0990 Supervision of high risk pregnancy, unspecified, unspecified trimester: Secondary | ICD-10-CM | POA: Insufficient documentation

## 2019-06-01 DIAGNOSIS — O10012 Pre-existing essential hypertension complicating pregnancy, second trimester: Secondary | ICD-10-CM | POA: Diagnosis not present

## 2019-06-01 DIAGNOSIS — Z3A27 27 weeks gestation of pregnancy: Secondary | ICD-10-CM

## 2019-06-02 ENCOUNTER — Other Ambulatory Visit (HOSPITAL_COMMUNITY): Payer: Self-pay | Admitting: *Deleted

## 2019-06-02 DIAGNOSIS — O09293 Supervision of pregnancy with other poor reproductive or obstetric history, third trimester: Secondary | ICD-10-CM

## 2019-06-03 ENCOUNTER — Other Ambulatory Visit: Payer: Self-pay | Admitting: *Deleted

## 2019-06-03 DIAGNOSIS — Z8759 Personal history of other complications of pregnancy, childbirth and the puerperium: Secondary | ICD-10-CM

## 2019-06-03 DIAGNOSIS — O10919 Unspecified pre-existing hypertension complicating pregnancy, unspecified trimester: Secondary | ICD-10-CM

## 2019-06-03 DIAGNOSIS — O0990 Supervision of high risk pregnancy, unspecified, unspecified trimester: Secondary | ICD-10-CM

## 2019-06-03 DIAGNOSIS — O09529 Supervision of elderly multigravida, unspecified trimester: Secondary | ICD-10-CM

## 2019-06-07 ENCOUNTER — Other Ambulatory Visit: Payer: BC Managed Care – PPO

## 2019-06-07 ENCOUNTER — Ambulatory Visit (INDEPENDENT_AMBULATORY_CARE_PROVIDER_SITE_OTHER): Payer: BC Managed Care – PPO | Admitting: Family Medicine

## 2019-06-07 ENCOUNTER — Other Ambulatory Visit: Payer: Self-pay

## 2019-06-07 VITALS — BP 106/71 | HR 108 | Temp 98.0°F | Wt 127.0 lb

## 2019-06-07 DIAGNOSIS — Z3A27 27 weeks gestation of pregnancy: Secondary | ICD-10-CM

## 2019-06-07 DIAGNOSIS — Z8759 Personal history of other complications of pregnancy, childbirth and the puerperium: Secondary | ICD-10-CM

## 2019-06-07 DIAGNOSIS — O10919 Unspecified pre-existing hypertension complicating pregnancy, unspecified trimester: Secondary | ICD-10-CM

## 2019-06-07 DIAGNOSIS — Z23 Encounter for immunization: Secondary | ICD-10-CM | POA: Diagnosis not present

## 2019-06-07 DIAGNOSIS — O09523 Supervision of elderly multigravida, third trimester: Secondary | ICD-10-CM

## 2019-06-07 DIAGNOSIS — O09522 Supervision of elderly multigravida, second trimester: Secondary | ICD-10-CM

## 2019-06-07 DIAGNOSIS — O365920 Maternal care for other known or suspected poor fetal growth, second trimester, not applicable or unspecified: Secondary | ICD-10-CM

## 2019-06-07 DIAGNOSIS — O0990 Supervision of high risk pregnancy, unspecified, unspecified trimester: Secondary | ICD-10-CM

## 2019-06-07 DIAGNOSIS — O365990 Maternal care for other known or suspected poor fetal growth, unspecified trimester, not applicable or unspecified: Secondary | ICD-10-CM

## 2019-06-07 DIAGNOSIS — O0992 Supervision of high risk pregnancy, unspecified, second trimester: Secondary | ICD-10-CM

## 2019-06-07 DIAGNOSIS — O10912 Unspecified pre-existing hypertension complicating pregnancy, second trimester: Secondary | ICD-10-CM

## 2019-06-07 DIAGNOSIS — O09529 Supervision of elderly multigravida, unspecified trimester: Secondary | ICD-10-CM

## 2019-06-07 DIAGNOSIS — O09292 Supervision of pregnancy with other poor reproductive or obstetric history, second trimester: Secondary | ICD-10-CM

## 2019-06-07 DIAGNOSIS — Z789 Other specified health status: Secondary | ICD-10-CM

## 2019-06-07 NOTE — Progress Notes (Signed)
   PRENATAL VISIT NOTE  Subjective:  Danielle Hoover is a 38 y.o. [id number] at [redacted]w[redacted]d being seen today for ongoing prenatal care.  She is currently monitored for the following issues for this high-risk pregnancy and has Language barrier; Supervision of high risk pregnancy, antepartum; History of IUFD; Advanced maternal age in multigravida; Chronic hypertension complicating pregnancy, antepartum; GBS bacteriuria; Lewis isoimmunization during pregnancy; and Fetal growth retardation, antenatal on their problem list.  Patient reports no complaints.  Contractions: Not present. Vag. Bleeding: None.  Movement: Present. Denies leaking of fluid.   The following portions of the patient's history were reviewed and updated as appropriate: allergies, current medications, past family history, past medical history, past social history, past surgical history and problem list.   Objective:   Vitals:   06/07/19 1143  BP: 106/71  Pulse: (!) 108  Temp: 98 F (36.7 C)  Weight: 127 lb (57.6 kg)    Fetal Status: Fetal Heart Rate (bpm): 147   Movement: Present     General:  Alert, oriented and cooperative. Patient is in no acute distress.  Skin: Skin is warm and dry. No rash noted.   Cardiovascular: Normal heart rate noted  Respiratory: Normal respiratory effort, no problems with respiration noted  Abdomen: Soft, gravid, appropriate for gestational age.  Pain/Pressure: Absent     Pelvic: Cervical exam deferred        Extremities: Normal range of motion.  Edema: Trace  Mental Status: Normal mood and affect. Normal behavior. Normal judgment and thought content.   Assessment and Plan:  Pregnancy: B2W4132 at [redacted]w[redacted]d 1. Chronic hypertension complicating pregnancy, antepartum BP is well controlled on no meds Continue ASA  2. Language barrier Declined interpreter  3. Supervision of high risk pregnancy, antepartum 28 wk labs today - Tdap vaccine greater than or equal to 7yo IM  4. History of IUFD For f/u u/s for  growth  5. Multigravida of advanced maternal age in third trimester   6. Fetal growth retardation, antenatal Last u/s EFW at 22%  Preterm labor symptoms and general obstetric precautions including but not limited to vaginal bleeding, contractions, leaking of fluid and fetal movement were reviewed in detail with the patient. Please refer to After Visit Summary for other counseling recommendations.   Return in about 2 weeks (around 06/21/2019) for in person.  Future Appointments  Date Time Provider [place]  06/29/2019  3:45 PM [place] MFC-US  06/29/2019  3:45 PM [place] 2 WH-MFCUS MFC-US    Donnamae Jude, MD

## 2019-06-07 NOTE — Patient Instructions (Signed)

## 2019-06-08 LAB — CBC
Hematocrit: 33.2 % — ABNORMAL LOW (ref 34.0–46.6)
Hemoglobin: 11.2 g/dL (ref 11.1–15.9)
MCH: 27.7 pg (ref 26.6–33.0)
MCHC: 33.7 g/dL (ref 31.5–35.7)
MCV: 82 fL (ref 79–97)
Platelets: 201 10*3/uL (ref 150–450)
RBC: 4.04 x10E6/uL (ref 3.77–5.28)
RDW: 13.5 % (ref 11.7–15.4)
WBC: 9.6 10*3/uL (ref 3.4–10.8)

## 2019-06-08 LAB — GLUCOSE TOLERANCE, 2 HOURS W/ 1HR
Glucose, 1 hour: 105 mg/dL (ref 65–179)
Glucose, 2 hour: 97 mg/dL (ref 65–152)
Glucose, Fasting: 63 mg/dL — ABNORMAL LOW (ref 65–91)

## 2019-06-08 LAB — RPR: RPR Ser Ql: NONREACTIVE

## 2019-06-08 LAB — HIV ANTIBODY (ROUTINE TESTING W REFLEX): HIV Screen 4th Generation wRfx: NONREACTIVE

## 2019-06-17 NOTE — MAU Provider Note (Signed)
Chief Complaint  Patient presents with  . Decreased Fetal Movement   LATE ENTRY:   First Provider Initiated Contact with Patient 05/27/19 1746      S: Danielle Hoover  is a 38 y.o. y.o. year old G87P3103 female at [redacted]w[redacted]d weeks gestation who presents to MAU reporting decreased fetal movement x 2 days.    Contractions: Denies Vaginal bleeding: Denies Leaking of fluid: Denies  Video [place] interpreter used.   Patient Active Problem List   Diagnosis Date Noted  . Fetal growth retardation, antenatal 05/12/2019  . Lewis isoimmunization during pregnancy 02/11/2019  . GBS bacteriuria 02/07/2019  . Supervision of high risk pregnancy, antepartum 02/02/2019  . History of IUFD 02/02/2019  . Advanced maternal age in multigravida 02/02/2019  . Chronic hypertension complicating pregnancy, antepartum 02/02/2019  . Language barrier 10/10/2014    O: No data found. General: NAD Heart: Regular rate Lungs: Normal rate and effort Abd: Soft, NT, Gravid, S=D Pelvic: NEFG, no blood.     NST performed EFM: 145, moderate variability, 15x15 accels, no decels  Toco: None  MDM: Fetal mvmt palpated by CNM and felt by pt. FHR reactive. Pt reassured.   A: [redacted]w[redacted]d week IUP Decreased fetal movement w/ mvmt present during MAU visit with reactive NST.  P: Discharge home in stable condition. Preterm labor precautions and fetal kick counts. Follow-up as scheduled for prenatal visit or sooner as needed if symptoms worsen. Return to maternity admissions as needed if symptoms worsen.  Tamala Julian, [place], [place] 06/17/2019 10:35 AM  2

## 2019-06-21 ENCOUNTER — Ambulatory Visit (INDEPENDENT_AMBULATORY_CARE_PROVIDER_SITE_OTHER): Payer: BC Managed Care – PPO | Admitting: Family Medicine

## 2019-06-21 ENCOUNTER — Other Ambulatory Visit: Payer: Self-pay

## 2019-06-21 VITALS — BP 112/77 | HR 101 | Wt 128.0 lb

## 2019-06-21 DIAGNOSIS — Z8759 Personal history of other complications of pregnancy, childbirth and the puerperium: Secondary | ICD-10-CM

## 2019-06-21 DIAGNOSIS — O361920 Maternal care for other isoimmunization, second trimester, not applicable or unspecified: Secondary | ICD-10-CM

## 2019-06-21 DIAGNOSIS — O361930 Maternal care for other isoimmunization, third trimester, not applicable or unspecified: Secondary | ICD-10-CM

## 2019-06-21 DIAGNOSIS — O09523 Supervision of elderly multigravida, third trimester: Secondary | ICD-10-CM

## 2019-06-21 DIAGNOSIS — O365990 Maternal care for other known or suspected poor fetal growth, unspecified trimester, not applicable or unspecified: Secondary | ICD-10-CM

## 2019-06-21 DIAGNOSIS — O10913 Unspecified pre-existing hypertension complicating pregnancy, third trimester: Secondary | ICD-10-CM

## 2019-06-21 DIAGNOSIS — O365930 Maternal care for other known or suspected poor fetal growth, third trimester, not applicable or unspecified: Secondary | ICD-10-CM

## 2019-06-21 DIAGNOSIS — Z789 Other specified health status: Secondary | ICD-10-CM

## 2019-06-21 DIAGNOSIS — O0990 Supervision of high risk pregnancy, unspecified, unspecified trimester: Secondary | ICD-10-CM

## 2019-06-21 DIAGNOSIS — O0993 Supervision of high risk pregnancy, unspecified, third trimester: Secondary | ICD-10-CM

## 2019-06-21 DIAGNOSIS — Z3A29 29 weeks gestation of pregnancy: Secondary | ICD-10-CM

## 2019-06-21 DIAGNOSIS — O10919 Unspecified pre-existing hypertension complicating pregnancy, unspecified trimester: Secondary | ICD-10-CM

## 2019-06-21 DIAGNOSIS — R8271 Bacteriuria: Secondary | ICD-10-CM

## 2019-06-21 NOTE — Progress Notes (Signed)
   PRENATAL VISIT NOTE  Subjective:  Danielle Hoover is a 38 y.o. K4M0102 at [redacted]w[redacted]d being seen today for ongoing prenatal care.  She is currently monitored for the following issues for this high-risk pregnancy and has Language barrier; Supervision of high risk pregnancy, antepartum; History of IUFD; Advanced maternal age in multigravida; Chronic hypertension complicating pregnancy, antepartum; GBS bacteriuria; Lewis isoimmunization during pregnancy; and Fetal growth retardation, antenatal on their problem list.  Patient reports no complaints.  Contractions: Not present. Vag. Bleeding: None.  Movement: Present. Denies leaking of fluid.   The following portions of the patient's history were reviewed and updated as appropriate: allergies, current medications, past family history, past medical history, past social history, past surgical history and problem list.   Objective:   Vitals:   06/21/19 1629  BP: 112/77  Pulse: (!) 101  Weight: 128 lb (58.1 kg)    Fetal Status: Fetal Heart Rate (bpm): 150 Fundal Height: 30 cm Movement: Present     General:  Alert, oriented and cooperative. Patient is in no acute distress.  Skin: Skin is warm and dry. No rash noted.   Cardiovascular: Normal heart rate noted  Respiratory: Normal respiratory effort, no problems with respiration noted  Abdomen: Soft, gravid, appropriate for gestational age.  Pain/Pressure: Absent     Pelvic: Cervical exam deferred        Extremities: Normal range of motion.  Edema: Trace  Mental Status: Normal mood and affect. Normal behavior. Normal judgment and thought content.   Assessment and Plan:  Pregnancy: V2Z3664 at [redacted]w[redacted]d  1. Supervision of high risk pregnancy, antepartum FHT and FH normal  2. Language barrier Interpreter used  3. Lewis isoimmunization during pregnancy in second trimester, single or unspecified fetus   4. History of IUFD Antenatal testing at 32 weeks Continue growth [place]  5. GBS bacteriuria Intrpartum  PPX  6. Fetal growth retardation, antenatal Has f/u US on 825  7. Multigravida of advanced maternal age in third trimester   8. Chronic hypertension complicating pregnancy, antepartum BP controlled  Preterm labor symptoms and general obstetric precautions including but not limited to vaginal bleeding, contractions, leaking of fluid and fetal movement were reviewed in detail with the patient. Please refer to After Visit Summary for other counseling recommendations.   No follow-ups on file.  Future Appointments  Date Time Provider [place]  06/29/2019  3:45 PM [place] [facility] MFC-US  06/29/2019  3:45 PM [place] 2 WH-MFCUS MFC-US    Truett Mainland, DO

## 2019-06-29 ENCOUNTER — Ambulatory Visit (HOSPITAL_COMMUNITY): Payer: BC Managed Care – PPO | Admitting: *Deleted

## 2019-06-29 ENCOUNTER — Other Ambulatory Visit: Payer: Self-pay

## 2019-06-29 ENCOUNTER — Ambulatory Visit (HOSPITAL_COMMUNITY)
Admission: RE | Admit: 2019-06-29 | Discharge: 2019-06-29 | Disposition: A | Discharge status: Home / Self Care | Payer: BC Managed Care – PPO | Source: Ambulatory Visit | Attending: Obstetrics and Gynecology | Admitting: Obstetrics and Gynecology

## 2019-06-29 DIAGNOSIS — R8271 Bacteriuria: Secondary | ICD-10-CM | POA: Diagnosis not present

## 2019-06-29 DIAGNOSIS — O09523 Supervision of elderly multigravida, third trimester: Secondary | ICD-10-CM | POA: Diagnosis not present

## 2019-06-29 DIAGNOSIS — O10013 Pre-existing essential hypertension complicating pregnancy, third trimester: Secondary | ICD-10-CM

## 2019-06-29 DIAGNOSIS — O0990 Supervision of high risk pregnancy, unspecified, unspecified trimester: Secondary | ICD-10-CM

## 2019-06-29 DIAGNOSIS — Z362 Encounter for other antenatal screening follow-up: Secondary | ICD-10-CM | POA: Diagnosis not present

## 2019-06-29 DIAGNOSIS — Z3A31 31 weeks gestation of pregnancy: Secondary | ICD-10-CM

## 2019-06-29 DIAGNOSIS — O09293 Supervision of pregnancy with other poor reproductive or obstetric history, third trimester: Secondary | ICD-10-CM | POA: Diagnosis not present

## 2019-06-29 DIAGNOSIS — O361920 Maternal care for other isoimmunization, second trimester, not applicable or unspecified: Secondary | ICD-10-CM

## 2019-06-29 NOTE — Consult Note (Deleted)
Maternal-Fetal Medicine  Name: Danielle ConnorsLaquetta Hoover (Telephone Consultation) MRN: [id number] Requesting Provider: Silverio LaySandra Rivard, MD Date and Time of Consultation: 06/29/2019 at 11:30 AM.  I spoke with Danielle Hoover on phone (confirmed with 2 identifiers). I introduced myself as the Maternal-Fetal Medicine physician.  Danielle Hoover was admitted yesterday with increasing shortness of breath and chest pain. Following her husband's COVID-19 infection, she was screened on 06/10/19 and the result came back positive for SARS-CoV-2 virus 2 days later (06/12/19). She has been having intermittent nausea and vomiting since the onset of COVID-19 infection. She also has decreased appetite. Patient reports she passed urine before coming to the hospital.   Following increased creatinine levels and decreased urine output, she was seen by Dr. Elvis CoilMartin Webb, nephrologist. From his note, I gather the patient has acute renal injury from severe dehydration.  After admission, she was seen by Medical team and the patient has a diagnosis of pneumonia (viral), transaminitis and acute renal failure.  Patient's vital signs including oxygen saturation are stable and she is not on dexamethasone or remdesivir. She did not require oxygen therapy. She is ambulating. Since admission, NST has been reassuring and she received one dose of nifedipine for uterine contractions that subsided.   Patient has Crohn's disease and she takes remicaide every 4 weeks. She has not had flares in this pregnancy. She does not have hypertension or diabetes or any other chronic medical conditions. She has sickle-cell trait (HbAS).  PSH: Nil of note. Medications:  Allergies: Contrast media. Social: Denies tobacco or drug or alcohol use. She is married and her husband, Danielle Hoover, is in good health. He had COVID-19 infection. He has sickle-thalassemia trait. Family: No history of venous thromboembolism in the family. Obstetric history is significant for  2 previous term vaginal deliveries (recently 2 years ago). She also had 1 early miscarriage. Gyn: Previous menstrual cycles were regular. No history of abnormal Pap smears or cervical surgeries. She denies history of breast disease.  Prenatal: Her prenatal course has, otherwise, been uneventful. She does not have gestational diabetes. She had opted not to screen for fetal aneuploidies.  Labs: Hb 15.8, Hct 47.2, PLT 250, WBC 4.5, creatinine 2.7 (increased), BUN 25, glucose 67, ALT 164, ALT 148, Total bilirubin 6, urine protein/creatine ratio: 0.83. Renal ultrasound: right kidney echogenic c/w parenchymal disease. Left kidney is not visualized. Vitals: 110/70 mm Hg, pulse 90/min, T 97.9, O2 sat 99-100% RA.  I counseled the patient on the following: COVID-infection with complications in pregnancy: -Patient has test sample taken on 08/06 (19 days ago). She has symptoms of cough since the onset of infection. The likelihood of having serious complications including intensive care management for pneumonia is less likely. Pregnant women with COVID-19 infection in the third trimester are more prone for complications, but recovery rate is also very high (over 85%).  -In the absence of labor, no intervention is indicated. I do not see any indications for delivery now. I informed her that she does not have any factors of maternal or fetal conditions that will improve with delivery.  -Acute renal injury is likely to be the result of acute dehydration and expectant management should improve renal functions. I will defer that to the nephrologist.  -Increased liver enzymes could not be explained from COVID-19 infection alone. Right upper quadrant ultrasound has been ordered. I reassured her that she does not have preeclampsia/HELLP syndrome that would warrant immediate delivery.  -Pneumonia: O2 sat stable and no evidence of deterioration of resp symptoms. Supportive management  should continue. Remdesivir and  corticosteroids are not indicated now.  -Dexamethasone for fetal lung maturity is not indicated now. If symptoms of early labor are present, antenatal corticosteroids may be given. Tocolysis is not indicated if the patient has uterine contractions.  -Vaginal delivery is the goal and cesarean section only for obstetric or maternal indications (severe respiratory failure).  -Ultrasound for fetal growth is not necessary as fetal growth scan performed 4 weeks ago was normal. Daily or twice-daily NST is sufficient. BPP is not indicated if NST is reassuring.  -Discontinuation of precautions (COVID-19) in a patient who was tested 19 days ago can be test-based or non-test based. If she is deemed not infective (more likely), isolation can be discontinued and the patient can be transferred to [facility]. A discussion with Infection Prevention will be helpful to make a decision.  Recommendations: -Continue supportive care. -Renal failure management as per nephrology recommendations. -Right upper quadrant ultrasound. -Avoid Tylenol. -Daily or twice-daily NST. -Discuss with Infection Prevention about discontinuation of COVID-19 precautions.  Thank you for consult. Please do not hesitate to contact me at the [facility] if you have any questions.  Consultation (telephone) counseling 30 minutes.

## 2019-06-30 ENCOUNTER — Other Ambulatory Visit (HOSPITAL_COMMUNITY): Payer: Self-pay | Admitting: *Deleted

## 2019-06-30 DIAGNOSIS — O09529 Supervision of elderly multigravida, unspecified trimester: Secondary | ICD-10-CM

## 2019-07-07 ENCOUNTER — Ambulatory Visit (INDEPENDENT_AMBULATORY_CARE_PROVIDER_SITE_OTHER): Payer: BC Managed Care – PPO | Admitting: Obstetrics & Gynecology

## 2019-07-07 ENCOUNTER — Other Ambulatory Visit: Payer: Self-pay

## 2019-07-07 VITALS — BP 109/59 | HR 105 | Wt 128.8 lb

## 2019-07-07 DIAGNOSIS — O0990 Supervision of high risk pregnancy, unspecified, unspecified trimester: Secondary | ICD-10-CM | POA: Diagnosis not present

## 2019-07-07 DIAGNOSIS — Z3A32 32 weeks gestation of pregnancy: Secondary | ICD-10-CM

## 2019-07-07 DIAGNOSIS — O0993 Supervision of high risk pregnancy, unspecified, third trimester: Secondary | ICD-10-CM

## 2019-07-07 DIAGNOSIS — O09293 Supervision of pregnancy with other poor reproductive or obstetric history, third trimester: Secondary | ICD-10-CM

## 2019-07-07 NOTE — Patient Instructions (Signed)

## 2019-07-07 NOTE — Progress Notes (Signed)
ur

## 2019-07-07 NOTE — Progress Notes (Signed)
   PRENATAL VISIT NOTE  Subjective:  Danielle Hoover is a 38 y.o. P5F1638 at [redacted]w[redacted]d being seen today for ongoing prenatal care.  She is currently monitored for the following issues for this high-risk pregnancy and has Language barrier; Supervision of high risk pregnancy, antepartum; History of IUFD; Advanced maternal age in multigravida; Chronic hypertension complicating pregnancy, antepartum; GBS bacteriuria; Lewis isoimmunization during pregnancy; and Fetal growth retardation, antenatal on their problem list.  Patient reports no complaints.  Contractions: Not present. Vag. Bleeding: None.  Movement: Present. Denies leaking of fluid.   The following portions of the patient's history were reviewed and updated as appropriate: allergies, current medications, past family history, past medical history, past social history, past surgical history and problem list.   Objective:   Vitals:   07/07/19 1524  BP: (!) 109/59  Pulse: (!) 105  Weight: 128 lb 12.8 oz (58.4 kg)    Fetal Status: Fetal Heart Rate (bpm): 141   Movement: Present     General:  Alert, oriented and cooperative. Patient is in no acute distress.  Skin: Skin is warm and dry. No rash noted.   Cardiovascular: Normal heart rate noted  Respiratory: Normal respiratory effort, no problems with respiration noted  Abdomen: Soft, gravid, appropriate for gestational age.  Pain/Pressure: Absent     Pelvic: Cervical exam deferred        Extremities: Normal range of motion.  Edema: Trace  Mental Status: Normal mood and affect. Normal behavior. Normal judgment and thought content.   Assessment and Plan:  Pregnancy: G6K5993 at [redacted]w[redacted]d 1. Supervision of high risk pregnancy, antepartum F/u TOC GBS bacteriuria - Urine Culture  Preterm labor symptoms and general obstetric precautions including but not limited to vaginal bleeding, contractions, leaking of fluid and fetal movement were reviewed in detail with the patient. Please refer to After Visit  Summary for other counseling recommendations.   Return in about 2 weeks (around 07/21/2019).  Future Appointments  Date Time Provider [place]  07/14/2019  3:10 PM [place] MFC-US  07/14/2019  3:15 PM [place] 4 WH-MFCUS MFC-US  07/21/2019  3:10 PM [place] MFC-US  07/21/2019  3:15 PM [place] 4 WH-MFCUS MFC-US  07/28/2019  3:10 PM [place] NURSE [place] MFC-US  07/28/2019  3:15 PM [place] 4 WH-MFCUS MFC-US    Emeterio Reeve, MD

## 2019-07-08 LAB — URINE CULTURE

## 2019-07-14 ENCOUNTER — Other Ambulatory Visit: Payer: Self-pay

## 2019-07-14 ENCOUNTER — Ambulatory Visit (HOSPITAL_COMMUNITY)
Admission: RE | Admit: 2019-07-14 | Discharge: 2019-07-14 | Disposition: A | Discharge status: Home / Self Care | Payer: BC Managed Care – PPO | Source: Ambulatory Visit | Attending: Obstetrics and Gynecology | Admitting: Obstetrics and Gynecology

## 2019-07-14 ENCOUNTER — Ambulatory Visit (HOSPITAL_COMMUNITY): Payer: BC Managed Care – PPO | Admitting: *Deleted

## 2019-07-14 ENCOUNTER — Encounter (HOSPITAL_COMMUNITY): Payer: Self-pay

## 2019-07-14 DIAGNOSIS — O09523 Supervision of elderly multigravida, third trimester: Secondary | ICD-10-CM

## 2019-07-14 DIAGNOSIS — O10013 Pre-existing essential hypertension complicating pregnancy, third trimester: Secondary | ICD-10-CM

## 2019-07-14 DIAGNOSIS — O361920 Maternal care for other isoimmunization, second trimester, not applicable or unspecified: Secondary | ICD-10-CM | POA: Insufficient documentation

## 2019-07-14 DIAGNOSIS — Z8759 Personal history of other complications of pregnancy, childbirth and the puerperium: Secondary | ICD-10-CM | POA: Insufficient documentation

## 2019-07-14 DIAGNOSIS — O0990 Supervision of high risk pregnancy, unspecified, unspecified trimester: Secondary | ICD-10-CM

## 2019-07-14 DIAGNOSIS — R8271 Bacteriuria: Secondary | ICD-10-CM

## 2019-07-14 DIAGNOSIS — O09293 Supervision of pregnancy with other poor reproductive or obstetric history, third trimester: Secondary | ICD-10-CM

## 2019-07-14 DIAGNOSIS — Z3A33 33 weeks gestation of pregnancy: Secondary | ICD-10-CM

## 2019-07-21 ENCOUNTER — Encounter (HOSPITAL_COMMUNITY): Payer: Self-pay

## 2019-07-21 ENCOUNTER — Ambulatory Visit (HOSPITAL_COMMUNITY): Payer: BC Managed Care – PPO | Admitting: *Deleted

## 2019-07-21 ENCOUNTER — Ambulatory Visit (HOSPITAL_COMMUNITY)
Admission: RE | Admit: 2019-07-21 | Discharge: 2019-07-21 | Disposition: A | Discharge status: Home / Self Care | Payer: BC Managed Care – PPO | Source: Ambulatory Visit | Attending: Obstetrics and Gynecology | Admitting: Obstetrics and Gynecology

## 2019-07-21 ENCOUNTER — Other Ambulatory Visit: Payer: Self-pay

## 2019-07-21 DIAGNOSIS — O0990 Supervision of high risk pregnancy, unspecified, unspecified trimester: Secondary | ICD-10-CM | POA: Insufficient documentation

## 2019-07-21 DIAGNOSIS — O09523 Supervision of elderly multigravida, third trimester: Secondary | ICD-10-CM

## 2019-07-21 DIAGNOSIS — O10013 Pre-existing essential hypertension complicating pregnancy, third trimester: Secondary | ICD-10-CM

## 2019-07-21 DIAGNOSIS — O361920 Maternal care for other isoimmunization, second trimester, not applicable or unspecified: Secondary | ICD-10-CM

## 2019-07-21 DIAGNOSIS — R8271 Bacteriuria: Secondary | ICD-10-CM | POA: Insufficient documentation

## 2019-07-21 DIAGNOSIS — O09293 Supervision of pregnancy with other poor reproductive or obstetric history, third trimester: Secondary | ICD-10-CM

## 2019-07-21 DIAGNOSIS — Z3689 Encounter for other specified antenatal screening: Secondary | ICD-10-CM | POA: Diagnosis not present

## 2019-07-21 DIAGNOSIS — O09529 Supervision of elderly multigravida, unspecified trimester: Secondary | ICD-10-CM | POA: Diagnosis not present

## 2019-07-21 DIAGNOSIS — Z3A34 34 weeks gestation of pregnancy: Secondary | ICD-10-CM

## 2019-07-22 ENCOUNTER — Ambulatory Visit (INDEPENDENT_AMBULATORY_CARE_PROVIDER_SITE_OTHER): Payer: BC Managed Care – PPO | Admitting: Obstetrics and Gynecology

## 2019-07-22 VITALS — BP 112/72 | HR 114

## 2019-07-22 DIAGNOSIS — O10913 Unspecified pre-existing hypertension complicating pregnancy, third trimester: Secondary | ICD-10-CM

## 2019-07-22 DIAGNOSIS — O0990 Supervision of high risk pregnancy, unspecified, unspecified trimester: Secondary | ICD-10-CM

## 2019-07-22 DIAGNOSIS — Z87898 Personal history of other specified conditions: Secondary | ICD-10-CM

## 2019-07-22 DIAGNOSIS — O361930 Maternal care for other isoimmunization, third trimester, not applicable or unspecified: Secondary | ICD-10-CM

## 2019-07-22 DIAGNOSIS — Z3A34 34 weeks gestation of pregnancy: Secondary | ICD-10-CM

## 2019-07-22 DIAGNOSIS — O99820 Streptococcus B carrier state complicating pregnancy: Secondary | ICD-10-CM

## 2019-07-22 DIAGNOSIS — O10919 Unspecified pre-existing hypertension complicating pregnancy, unspecified trimester: Secondary | ICD-10-CM

## 2019-07-22 DIAGNOSIS — O0993 Supervision of high risk pregnancy, unspecified, third trimester: Secondary | ICD-10-CM

## 2019-07-22 DIAGNOSIS — Z8759 Personal history of other complications of pregnancy, childbirth and the puerperium: Secondary | ICD-10-CM

## 2019-07-22 DIAGNOSIS — O09523 Supervision of elderly multigravida, third trimester: Secondary | ICD-10-CM

## 2019-07-22 DIAGNOSIS — Z789 Other specified health status: Secondary | ICD-10-CM

## 2019-07-22 DIAGNOSIS — R8271 Bacteriuria: Secondary | ICD-10-CM

## 2019-07-22 NOTE — Progress Notes (Signed)
Prenatal Visit Note Date: 07/22/2019 Clinic: [facility]  Subjective:  Danielle Hoover is a 38 y.o. Y1P5093 at [redacted]w[redacted]d being seen today for ongoing prenatal care.  She is currently monitored for the following issues for this high-risk pregnancy and has Language barrier; Supervision of high risk pregnancy, antepartum; History of IUFD; Advanced maternal age in multigravida; Chronic hypertension complicating pregnancy, antepartum; GBS bacteriuria; Lewis isoimmunization during pregnancy; and Fetal growth retardation, antenatal on their problem list.  Patient reports no complaints.   Contractions: Irregular. Vag. Bleeding: None.  Movement: Present. Denies leaking of fluid.   The following portions of the patient's history were reviewed and updated as appropriate: allergies, current medications, past family history, past medical history, past social history, past surgical history and problem list. Problem list updated.  Objective:   Vitals:   07/22/19 1346  BP: 112/72  Pulse: (!) 114    Fetal Status: Fetal Heart Rate (bpm): 147 Fundal Height: 35 cm Movement: Present  Presentation: Vertex  General:  Alert, oriented and cooperative. Patient is in no acute distress.  Skin: Skin is warm and dry. No rash noted.   Cardiovascular: Normal heart rate noted  Respiratory: Normal respiratory effort, no problems with respiration noted  Abdomen: Soft, gravid, appropriate for gestational age. Pain/Pressure: Present     Pelvic:  Cervical exam deferred        Extremities: Normal range of motion.  Edema: None  Mental Status: Normal mood and affect. Normal behavior. Normal judgment and thought content.   Urinalysis:      Assessment and Plan:  Pregnancy: O6Z1245 at [redacted]w[redacted]d  1. Chronic hypertension complicating pregnancy, antepartum Doing well just on low dose asa Has rpt growth and bpp for next week bpp yesterday 8/8  2. Supervision of high risk pregnancy, antepartum Routine  care nexplanon  3. History of IUFD D/w her re: recommendation for IOL at 39wks Set up IOL nv  4. Multigravida of advanced maternal age in third trimester No issues  5. GBS bacteriuria toc neg  6. Lewis isoimmunization during pregnancy in third trimester, single or unspecified fetus No issues Follow up admit t&s  7. Fetal growth retardation, antenatal resolved  8. Language barrier Interpreter used  Preterm labor symptoms and general obstetric precautions including but not limited to vaginal bleeding, contractions, leaking of fluid and fetal movement were reviewed in detail with the patient. Please refer to After Visit Summary for other counseling recommendations.  Return in about 2 weeks (around 08/05/2019) for high risk, in person, bpp/nst with diane.   Aletha Halim, MD

## 2019-07-28 ENCOUNTER — Ambulatory Visit (HOSPITAL_COMMUNITY): Payer: BC Managed Care – PPO | Admitting: *Deleted

## 2019-07-28 ENCOUNTER — Other Ambulatory Visit (HOSPITAL_COMMUNITY): Payer: Self-pay | Admitting: *Deleted

## 2019-07-28 ENCOUNTER — Ambulatory Visit (HOSPITAL_COMMUNITY)
Admission: RE | Admit: 2019-07-28 | Discharge: 2019-07-28 | Disposition: A | Discharge status: Home / Self Care | Payer: BC Managed Care – PPO | Source: Ambulatory Visit | Attending: Obstetrics and Gynecology | Admitting: Obstetrics and Gynecology

## 2019-07-28 ENCOUNTER — Other Ambulatory Visit: Payer: Self-pay

## 2019-07-28 ENCOUNTER — Encounter (HOSPITAL_COMMUNITY): Payer: Self-pay

## 2019-07-28 DIAGNOSIS — O09529 Supervision of elderly multigravida, unspecified trimester: Secondary | ICD-10-CM

## 2019-07-28 DIAGNOSIS — O361930 Maternal care for other isoimmunization, third trimester, not applicable or unspecified: Secondary | ICD-10-CM | POA: Diagnosis not present

## 2019-07-28 DIAGNOSIS — O09293 Supervision of pregnancy with other poor reproductive or obstetric history, third trimester: Secondary | ICD-10-CM

## 2019-07-28 DIAGNOSIS — Z362 Encounter for other antenatal screening follow-up: Secondary | ICD-10-CM | POA: Diagnosis not present

## 2019-07-28 DIAGNOSIS — O10013 Pre-existing essential hypertension complicating pregnancy, third trimester: Secondary | ICD-10-CM | POA: Diagnosis not present

## 2019-07-28 DIAGNOSIS — O09523 Supervision of elderly multigravida, third trimester: Secondary | ICD-10-CM

## 2019-07-28 DIAGNOSIS — Z3A35 35 weeks gestation of pregnancy: Secondary | ICD-10-CM | POA: Diagnosis not present

## 2019-07-28 DIAGNOSIS — R8271 Bacteriuria: Secondary | ICD-10-CM | POA: Diagnosis not present

## 2019-07-28 DIAGNOSIS — O0990 Supervision of high risk pregnancy, unspecified, unspecified trimester: Secondary | ICD-10-CM | POA: Insufficient documentation

## 2019-07-28 DIAGNOSIS — O10919 Unspecified pre-existing hypertension complicating pregnancy, unspecified trimester: Secondary | ICD-10-CM

## 2019-07-30 ENCOUNTER — Other Ambulatory Visit: Payer: Self-pay | Admitting: Student

## 2019-07-30 DIAGNOSIS — O10911 Unspecified pre-existing hypertension complicating pregnancy, first trimester: Secondary | ICD-10-CM

## 2019-08-05 ENCOUNTER — Encounter (HOSPITAL_COMMUNITY): Payer: Self-pay

## 2019-08-05 ENCOUNTER — Other Ambulatory Visit (HOSPITAL_COMMUNITY)
Admission: RE | Admit: 2019-08-05 | Discharge: 2019-08-05 | Disposition: A | Discharge status: Home / Self Care | Payer: BC Managed Care – PPO | Source: Ambulatory Visit | Attending: Obstetrics & Gynecology | Admitting: Obstetrics & Gynecology

## 2019-08-05 ENCOUNTER — Ambulatory Visit (INDEPENDENT_AMBULATORY_CARE_PROVIDER_SITE_OTHER): Payer: BC Managed Care – PPO | Admitting: *Deleted

## 2019-08-05 ENCOUNTER — Ambulatory Visit (INDEPENDENT_AMBULATORY_CARE_PROVIDER_SITE_OTHER): Payer: BC Managed Care – PPO | Admitting: Obstetrics & Gynecology

## 2019-08-05 ENCOUNTER — Ambulatory Visit: Payer: Self-pay

## 2019-08-05 ENCOUNTER — Ambulatory Visit (HOSPITAL_COMMUNITY): Payer: Medicaid Other

## 2019-08-05 ENCOUNTER — Other Ambulatory Visit: Payer: Self-pay

## 2019-08-05 VITALS — BP 111/67 | HR 102 | Wt 133.5 lb

## 2019-08-05 DIAGNOSIS — O09293 Supervision of pregnancy with other poor reproductive or obstetric history, third trimester: Secondary | ICD-10-CM

## 2019-08-05 DIAGNOSIS — O0993 Supervision of high risk pregnancy, unspecified, third trimester: Secondary | ICD-10-CM

## 2019-08-05 DIAGNOSIS — Z789 Other specified health status: Secondary | ICD-10-CM

## 2019-08-05 DIAGNOSIS — Z8759 Personal history of other complications of pregnancy, childbirth and the puerperium: Secondary | ICD-10-CM

## 2019-08-05 DIAGNOSIS — O10919 Unspecified pre-existing hypertension complicating pregnancy, unspecified trimester: Secondary | ICD-10-CM

## 2019-08-05 DIAGNOSIS — O0990 Supervision of high risk pregnancy, unspecified, unspecified trimester: Secondary | ICD-10-CM | POA: Insufficient documentation

## 2019-08-05 DIAGNOSIS — O09523 Supervision of elderly multigravida, third trimester: Secondary | ICD-10-CM | POA: Diagnosis not present

## 2019-08-05 DIAGNOSIS — O10913 Unspecified pre-existing hypertension complicating pregnancy, third trimester: Secondary | ICD-10-CM

## 2019-08-05 DIAGNOSIS — Z3A36 36 weeks gestation of pregnancy: Secondary | ICD-10-CM

## 2019-08-05 LAB — POCT URINALYSIS DIP (DEVICE)
Bilirubin Urine: NEGATIVE
Glucose, UA: NEGATIVE mg/dL
Ketones, ur: NEGATIVE mg/dL
Leukocytes,Ua: NEGATIVE
Nitrite: NEGATIVE
Protein, ur: 100 mg/dL — AB
Specific Gravity, Urine: 1.02 (ref 1.005–1.030)
Urobilinogen, UA: 1 mg/dL (ref 0.0–1.0)
pH: 7 (ref 5.0–8.0)

## 2019-08-05 NOTE — Progress Notes (Signed)
Interpreter Altha Harm present for encounter.  [place] growth done 9/23.     PRENATAL VISIT NOTE  Subjective:  Danielle Hoover is a 38 y.o. V0J5009 at [redacted]w[redacted]d being seen today for ongoing prenatal care.  She is currently monitored for the following issues for this high-risk pregnancy and has Language barrier; Supervision of high risk pregnancy, antepartum; History of IUFD; Advanced maternal age in multigravida; Chronic hypertension complicating pregnancy, antepartum; GBS bacteriuria; Lewis isoimmunization during pregnancy; and History of poor fetal growth on their problem list.  Patient reports no complaints.  Contractions: Not present. Vag. Bleeding: None.  Movement: Present. Denies leaking of fluid.   The following portions of the patient's history were reviewed and updated as appropriate: allergies, current medications, past family history, past medical history, past social history, past surgical history and problem list.   Objective:   Vitals:   08/05/19 1406  BP: 111/67  Pulse: (!) 102  Weight: 133 lb 8 oz (60.6 kg)    Fetal Status: Fetal Heart Rate (bpm): NST   Movement: Present     General:  Alert, oriented and cooperative. Patient is in no acute distress.  Skin: Skin is warm and dry. No rash noted.   Cardiovascular: Normal heart rate noted  Respiratory: Normal respiratory effort, no problems with respiration noted  Abdomen: Soft, gravid, appropriate for gestational age.  Pain/Pressure: Absent     Pelvic: Cervical exam performed      1.5/thick/ballotable  Extremities: Normal range of motion.  Edema: None  Mental Status: Normal mood and affect. Normal behavior. Normal judgment and thought content.   Assessment and Plan:  Pregnancy: F8H8299 at [redacted]w[redacted]d 1. Supervision of high risk pregnancy, antepartum - GC/Chlamydia probe amp ([place])not at [facility] - GBS in urine so no culture.  2. Chronic hypertension complicating pregnancy, antepartum BP well controlled Nml testing today.  3. History  of IUFD This patient was seen for a follow up growth scan due to due  to a history of chronic hypertension and a history of a prior  IUFD at around 31 weeks.  She denies any problems since  her last exam and reports good fetal movement.  She was informed that the fetal growth and amniotic fluid  level appears appropriate for her gestational age.  A biophysical profile performed today was 8 out of 8.  Due to her poor obstetrical history and chronic hypertension,  we will continue to follow her with weekly fetal testing.  She should probably be delivered at around 37 to 38 weeks.  A follow-up biophysical profile was scheduled in 1 week.  44. Multigravida of advanced maternal age in third trimester  5. Language barrier  Term labor symptoms and general obstetric precautions including but not limited to vaginal bleeding, contractions, leaking of fluid and fetal movement were reviewed in detail with the patient. Please refer to After Visit Summary for other counseling recommendations.   Return in about 1 week (around 08/12/2019) for weekly as scheduled.  Future Appointments  Date Time Provider [place]  08/05/2019  3:45 PM [facility] IMAGING [place]  08/12/2019  3:15 PM WOC-WOCA NST WOC-WOCA WOC  08/12/2019  4:15 PM Aletha Halim, MD [place]  08/18/2019  1:15 PM WOC-WOCA NST WOC-WOCA WOC  08/18/2019  3:15 PM [place], Vickii Chafe, MD [facility] WOC    Silas [place], MD

## 2019-08-06 LAB — GC/CHLAMYDIA PROBE AMP (~~LOC~~) NOT AT ARMC
Chlamydia: NEGATIVE
Neisseria Gonorrhea: NEGATIVE

## 2019-08-08 ENCOUNTER — Other Ambulatory Visit (HOSPITAL_COMMUNITY)
Admission: RE | Admit: 2019-08-08 | Discharge: 2019-08-08 | Disposition: A | Discharge status: Home / Self Care | Payer: BC Managed Care – PPO | Source: Ambulatory Visit | Attending: Internal Medicine | Admitting: Internal Medicine

## 2019-08-10 ENCOUNTER — Encounter (HOSPITAL_COMMUNITY): Payer: Self-pay | Admitting: *Deleted

## 2019-08-10 ENCOUNTER — Inpatient Hospital Stay (HOSPITAL_COMMUNITY): Payer: BC Managed Care – PPO

## 2019-08-10 ENCOUNTER — Other Ambulatory Visit: Payer: Self-pay

## 2019-08-10 ENCOUNTER — Inpatient Hospital Stay (HOSPITAL_COMMUNITY)
Admission: AD | Admit: 2019-08-10 | Discharge: 2019-08-12 | DRG: 807 | Disposition: A | Discharge status: Home / Self Care | Payer: BC Managed Care – PPO | Attending: Family Medicine | Admitting: Family Medicine

## 2019-08-10 DIAGNOSIS — Z20828 Contact with and (suspected) exposure to other viral communicable diseases: Secondary | ICD-10-CM | POA: Diagnosis not present

## 2019-08-10 DIAGNOSIS — O09523 Supervision of elderly multigravida, third trimester: Secondary | ICD-10-CM

## 2019-08-10 DIAGNOSIS — O09299 Supervision of pregnancy with other poor reproductive or obstetric history, unspecified trimester: Secondary | ICD-10-CM

## 2019-08-10 DIAGNOSIS — O361930 Maternal care for other isoimmunization, third trimester, not applicable or unspecified: Secondary | ICD-10-CM | POA: Diagnosis not present

## 2019-08-10 DIAGNOSIS — O0990 Supervision of high risk pregnancy, unspecified, unspecified trimester: Secondary | ICD-10-CM

## 2019-08-10 DIAGNOSIS — R8271 Bacteriuria: Secondary | ICD-10-CM

## 2019-08-10 DIAGNOSIS — Z30017 Encounter for initial prescription of implantable subdermal contraceptive: Secondary | ICD-10-CM | POA: Diagnosis not present

## 2019-08-10 DIAGNOSIS — O10911 Unspecified pre-existing hypertension complicating pregnancy, first trimester: Secondary | ICD-10-CM

## 2019-08-10 DIAGNOSIS — O1002 Pre-existing essential hypertension complicating childbirth: Secondary | ICD-10-CM | POA: Diagnosis not present

## 2019-08-10 DIAGNOSIS — Z3A37 37 weeks gestation of pregnancy: Secondary | ICD-10-CM | POA: Diagnosis not present

## 2019-08-10 DIAGNOSIS — O99824 Streptococcus B carrier state complicating childbirth: Secondary | ICD-10-CM | POA: Diagnosis not present

## 2019-08-10 DIAGNOSIS — Z3490 Encounter for supervision of normal pregnancy, unspecified, unspecified trimester: Secondary | ICD-10-CM | POA: Diagnosis present

## 2019-08-10 LAB — SARS CORONAVIRUS 2 BY RT PCR (HOSPITAL ORDER, PERFORMED IN ~~LOC~~ HOSPITAL LAB): SARS Coronavirus 2: NEGATIVE

## 2019-08-10 LAB — CBC
HCT: 35.7 % — ABNORMAL LOW (ref 36.0–46.0)
Hemoglobin: 11.2 g/dL — ABNORMAL LOW (ref 12.0–15.0)
MCH: 26.3 pg (ref 26.0–34.0)
MCHC: 31.4 g/dL (ref 30.0–36.0)
MCV: 83.8 fL (ref 80.0–100.0)
Platelets: 210 10*3/uL (ref 150–400)
RBC: 4.26 MIL/uL (ref 3.87–5.11)
RDW: 14.6 % (ref 11.5–15.5)
WBC: 8.1 10*3/uL (ref 4.0–10.5)
nRBC: 0 % (ref 0.0–0.2)

## 2019-08-10 MED ORDER — SOD CITRATE-CITRIC ACID 500-334 MG/5ML PO SOLN: 30.0000 mL | ORAL | Status: DC | PRN

## 2019-08-10 MED ORDER — EPHEDRINE 5 MG/ML INJ: 10.0000 mg | INTRAVENOUS | Status: DC | PRN

## 2019-08-10 MED ORDER — LACTATED RINGERS IV SOLN
INTRAVENOUS | Status: DC
  Administered 2019-08-10 (×2): via INTRAVENOUS

## 2019-08-10 MED ORDER — DIPHENHYDRAMINE HCL 50 MG/ML IJ SOLN: 12.5000 mg | INTRAMUSCULAR | Status: DC | PRN

## 2019-08-10 MED ORDER — PHENYLEPHRINE 40 MCG/ML (10ML) SYRINGE FOR IV PUSH (FOR BLOOD PRESSURE SUPPORT): 80.0000 ug | PREFILLED_SYRINGE | INTRAVENOUS | Status: DC | PRN

## 2019-08-10 MED ORDER — DIBUCAINE (PERIANAL) 1 % EX OINT: 1.0000 "application " | TOPICAL_OINTMENT | CUTANEOUS | Status: DC | PRN

## 2019-08-10 MED ORDER — OXYCODONE-ACETAMINOPHEN 5-325 MG PO TABS
2.0000 | ORAL_TABLET | ORAL | Status: DC | PRN
  Administered 2019-08-11: 2 via ORAL

## 2019-08-10 MED ORDER — FENTANYL-BUPIVACAINE-NACL 0.5-0.125-0.9 MG/250ML-% EP SOLN: 12.0000 mL/h | EPIDURAL | Status: DC | PRN

## 2019-08-10 MED ORDER — TERBUTALINE SULFATE 1 MG/ML IJ SOLN: 0.2500 mg | Freq: Once | INTRAMUSCULAR | Status: DC | PRN

## 2019-08-10 MED ORDER — PRENATAL MULTIVITAMIN CH
1.0000 | ORAL_TABLET | Freq: Every day | ORAL | Status: DC
  Administered 2019-08-11 – 2019-08-12 (×2): 1 via ORAL
  Filled 2019-08-10 (×2): qty 1

## 2019-08-10 MED ORDER — MISOPROSTOL 50MCG HALF TABLET
50.0000 ug | ORAL_TABLET | ORAL | Status: DC | PRN
  Administered 2019-08-10: 50 ug via BUCCAL
  Filled 2019-08-10: qty 1

## 2019-08-10 MED ORDER — ZOLPIDEM TARTRATE 5 MG PO TABS: 5.0000 mg | ORAL_TABLET | Freq: Every evening | ORAL | Status: DC | PRN

## 2019-08-10 MED ORDER — OXYCODONE-ACETAMINOPHEN 5-325 MG PO TABS: 2.0000 | ORAL_TABLET | ORAL | Status: DC | PRN

## 2019-08-10 MED ORDER — WITCH HAZEL-GLYCERIN EX PADS: 1.0000 "application " | MEDICATED_PAD | CUTANEOUS | Status: DC | PRN

## 2019-08-10 MED ORDER — LACTATED RINGERS IV SOLN: 500.0000 mL | INTRAVENOUS | Status: DC | PRN

## 2019-08-10 MED ORDER — SENNOSIDES-DOCUSATE SODIUM 8.6-50 MG PO TABS
2.0000 | ORAL_TABLET | ORAL | Status: DC
  Administered 2019-08-10 – 2019-08-12 (×2): 2 via ORAL
  Filled 2019-08-10 (×2): qty 2

## 2019-08-10 MED ORDER — OXYTOCIN BOLUS FROM INFUSION: 500.0000 mL | Freq: Once | INTRAVENOUS | Status: DC

## 2019-08-10 MED ORDER — LIDOCAINE HCL (PF) 1 % IJ SOLN: 30.0000 mL | INTRAMUSCULAR | Status: DC | PRN

## 2019-08-10 MED ORDER — ONDANSETRON HCL 4 MG PO TABS: 4.0000 mg | ORAL_TABLET | ORAL | Status: DC | PRN

## 2019-08-10 MED ORDER — BENZOCAINE-MENTHOL 20-0.5 % EX AERO: 1.0000 "application " | INHALATION_SPRAY | CUTANEOUS | Status: DC | PRN

## 2019-08-10 MED ORDER — ONDANSETRON HCL 4 MG/2ML IJ SOLN: 4.0000 mg | INTRAMUSCULAR | Status: DC | PRN

## 2019-08-10 MED ORDER — FLEET ENEMA 7-19 GM/118ML RE ENEM: 1.0000 | ENEMA | RECTAL | Status: DC | PRN

## 2019-08-10 MED ORDER — DIPHENHYDRAMINE HCL 25 MG PO CAPS: 25.0000 mg | ORAL_CAPSULE | Freq: Four times a day (QID) | ORAL | Status: DC | PRN

## 2019-08-10 MED ORDER — SODIUM CHLORIDE 0.9 % IV SOLN
5.0000 10*6.[IU] | Freq: Once | INTRAVENOUS | Status: AC
  Administered 2019-08-10: 5 10*6.[IU] via INTRAVENOUS
  Filled 2019-08-10: qty 5

## 2019-08-10 MED ORDER — OXYCODONE-ACETAMINOPHEN 5-325 MG PO TABS
1.0000 | ORAL_TABLET | ORAL | Status: DC | PRN
  Administered 2019-08-10: 1 via ORAL
  Filled 2019-08-10 (×3): qty 1

## 2019-08-10 MED ORDER — LACTATED RINGERS IV SOLN: 500.0000 mL | Freq: Once | INTRAVENOUS | Status: DC

## 2019-08-10 MED ORDER — OXYTOCIN 10 UNIT/ML IJ SOLN
INTRAMUSCULAR | Status: AC
  Administered 2019-08-10: 10 [IU]
  Filled 2019-08-10: qty 1

## 2019-08-10 MED ORDER — IBUPROFEN 600 MG PO TABS
600.0000 mg | ORAL_TABLET | Freq: Four times a day (QID) | ORAL | Status: DC
  Administered 2019-08-10 – 2019-08-12 (×7): 600 mg via ORAL
  Filled 2019-08-10 (×7): qty 1

## 2019-08-10 MED ORDER — OXYTOCIN 40 UNITS IN NORMAL SALINE INFUSION - SIMPLE MED: 2.5000 [IU]/h | INTRAVENOUS | Status: DC

## 2019-08-10 MED ORDER — OXYCODONE-ACETAMINOPHEN 5-325 MG PO TABS: 1.0000 | ORAL_TABLET | ORAL | Status: DC | PRN

## 2019-08-10 MED ORDER — ONDANSETRON HCL 4 MG/2ML IJ SOLN: 4.0000 mg | Freq: Four times a day (QID) | INTRAMUSCULAR | Status: DC | PRN

## 2019-08-10 MED ORDER — PENICILLIN G 3 MILLION UNITS IVPB - SIMPLE MED
3.0000 10*6.[IU] | INTRAVENOUS | Status: DC
  Administered 2019-08-10: 3 10*6.[IU] via INTRAVENOUS
  Filled 2019-08-10: qty 100

## 2019-08-10 MED ORDER — OXYTOCIN 40 UNITS IN NORMAL SALINE INFUSION - SIMPLE MED
1.0000 m[IU]/min | INTRAVENOUS | Status: DC
  Administered 2019-08-10: 2 m[IU]/min via INTRAVENOUS
  Filled 2019-08-10: qty 1000

## 2019-08-10 MED ORDER — TETANUS-DIPHTH-ACELL PERTUSSIS 5-2.5-18.5 LF-MCG/0.5 IM SUSP: 0.5000 mL | Freq: Once | INTRAMUSCULAR | Status: DC

## 2019-08-10 MED ORDER — ACETAMINOPHEN 325 MG PO TABS
650.0000 mg | ORAL_TABLET | ORAL | Status: DC | PRN
  Administered 2019-08-11 – 2019-08-12 (×2): 650 mg via ORAL
  Filled 2019-08-10 (×2): qty 2

## 2019-08-10 MED ORDER — SIMETHICONE 80 MG PO CHEW: 80.0000 mg | CHEWABLE_TABLET | ORAL | Status: DC | PRN

## 2019-08-10 MED ORDER — ACETAMINOPHEN 325 MG PO TABS: 650.0000 mg | ORAL_TABLET | ORAL | Status: DC | PRN

## 2019-08-10 MED ORDER — COCONUT OIL OIL: 1.0000 "application " | TOPICAL_OIL | Status: DC | PRN

## 2019-08-10 NOTE — Progress Notes (Signed)
Danielle Hoover is a 38 y.o. [id number] at [redacted]w[redacted]d admitted for IOL for chronic HTN, hx IUFD, AMA. Language barrier present.  Subjective: Reports feeling more discomfort with contractions. Is planning for epidural placement. FOB supportive at bedside.  Objective: Vitals:   08/10/19 1038 08/10/19 1552  BP: 121/62 113/73  Pulse: 99 100  Temp:  98.1 F (36.7 C)    No intake/output data recorded. No intake/output data recorded.   FHT:  FHR: 145 bpm, variability: moderate,  accelerations:  Present,  decelerations:  Absent UC:   regular, every 2-3 minutes SVE:   Dilation: 2.5 Effacement (%): 50 Station: -2 Exam by:: m wilkins rnc  Labs:   Recent Labs    08/10/19 1032  WBC 8.1  HGB 11.2*  HCT 35.7*  PLT 210    Assessment / Plan: Induction of labor due to chronic HTN, hx IUFD, AMA,  progressing well on pitocin  Labor: AROM with clear fluid noted. Continue with titration of Pitocin per protocol. Preeclampsia:  no signs or symptoms of toxicity and labs stable Fetal Wellbeing:  Category I Pain Control:  Planning for epidural I/D:  GBS present in urine. GBS prophylaxis per protocol. Anticipated MOD:  NSVD  Juanna Cao, CNM, BSN 08/10/2019, 5:35 PM

## 2019-08-10 NOTE — H&P (Addendum)
ADMISSION/ HISTORY & PHYSICAL:  Admission Date: 08/10/2019  9:13 AM  Admit Diagnosis: Danielle Hoover is a 38 y.o. female presenting for IOL for chronic HTN, hx IUFD, AMA. Language barrier - hospital interpreter present in room.  Prenatal History: [id number]   EDC : 08/31/2019, by Last Menstrual Period  [facility] at [facility] since [redacted] wk gestation  Prenatal course complicated by: Chronic HTN, hx IUFD, AMA, Lewis isoimmunization  Prenatal Labs: ABO, Rh: AB (03/31 1604)  Antibody: POS (10/06 1032) Anti Lewis B Rubella: 1.87 (03/31 1604)  RPR: Non Reactive (08/03 1011)  HBsAg: Negative (03/31 1604)  HIV: Non Reactive (08/03 1011)  GBS:   positive in urine 1 hr Glucola : Third Trimester 63/105/97 Genetic Screening: NIPS low risk female, AFP negative Ultrasound: Normal anatomy US    Maternal Diabetes: No Genetic Screening: Normal Maternal Ultrasounds/Referrals: Normal Fetal Ultrasounds or other Referrals:  None Maternal Substance Abuse:  No Significant Maternal Medications:  None Significant Maternal Lab Results:  Group B Strep positive in urine Other Comments:  None    Nursing Staff Provider  Office Location  [facility] Dating   LMP  Language  Jarai Anatomy US   normal  Flu Vaccine  03/02/2019 Genetic Screen  NIPS: low risk female  AFP: Negative   TDaP vaccine   06/07/2019 Hgb A1C or  GTT Early  Third trimester 63/105/97  Rhogam  n/a   LAB RESULTS   Feeding Plan Both Blood Type   AB positive  Contraception  Antibody  POSITIVE anti lewis b  Circumcision  Rubella  immune  Pediatrician   RPR   negative  Support Person Krai HBsAg   negative  Prenatal Classes  HIV Non Reactive (06/19 1017)  BTL Consent  GBS  POSITIVE in urine  VBAC Consent  Pap  2018, NIL    Hgb Electro   normal    CF  negative    SMA  low risk    Waterbirth  [ ]  Class [ ]  Consent [ ]  CNM visit     Medical / Surgical History :  Past medical history:  Past Medical  History:  Diagnosis Date  . Anemia    during pregnancy 2006  . Asthma   . Headache   . Hx of chlamydia infection 2016  . Hypertension      Past surgical history:  Past Surgical History:  Procedure Laterality Date  . NO PAST SURGERIES       Family History:  Family History  Problem Relation Age of Onset  . Alcohol abuse Neg Hx   . Arthritis Neg Hx   . Asthma Neg Hx   . Birth defects Neg Hx   . Cancer Neg Hx   . COPD Neg Hx   . Depression Neg Hx   . Diabetes Neg Hx   . Drug abuse Neg Hx   . Early death Neg Hx   . Hearing loss Neg Hx   . Heart disease Neg Hx   . Hyperlipidemia Neg Hx   . Hypertension Neg Hx   . Kidney disease Neg Hx   . Learning disabilities Neg Hx   . Mental illness Neg Hx   . Mental retardation Neg Hx   . Miscarriages / Stillbirths Neg Hx   . Stroke Neg Hx   . Vision loss Neg Hx   . Varicose Veins Neg Hx      Social History:  reports that she has never smoked. She  has never used smokeless tobacco. She reports that she does not drink alcohol or use drugs.   Allergies: Patient has no known allergies.   Current Medications at time of admission:  Medications Prior to Admission  Medication Sig Dispense Refill Last Dose  . [employer]S ASPIRIN ADULT LOW DOSE 81 MG chewable tablet CHEW 1 TABLET (81 MG TOTAL) BY MOUTH DAILY. (Patient taking differently: Chew 81 mg by mouth daily. ) 90 tablet 1   . metoCLOPramide (REGLAN) 10 MG tablet Take 1 tablet (10 mg total) by mouth every 6 (six) hours. (Patient not taking: Reported on 04/06/2019) 30 tablet 0   . Prenatal Vit-Fe Fumarate-FA (PREPLUS) 27-1 MG TABS Take 1 tablet by mouth daily. 30 tablet 13      Review of Systems: Denies HA, RUQ pain, visual disturbances. Denies nausea, vomiting. Denies LOF, vaginal bleeding. Denies pain or discomfort with contractions.  Physical Exam: Vital signs and nursing notes reviewed.  ED Triage Vitals [08/10/19 1038]  Enc Vitals Group     BP 121/62     Pulse Rate 99     Resp       Temp      Temp src      SpO2      Weight      Height      Head Circumference      Peak Flow      Pain Score      Pain Loc      Pain Edu?      Excl. in GC?      General: AAO x 3, NAD, coping well Heart: RRR Lungs:CTAB Abdomen: Gravid, NT Extremities: No edema Genitalia / VE: Dilation: 2 Effacement (%): 50 Cervical Position: Posterior Station: -2 Presentation: Vertex Exam by:: m wilkins rnc   FHR: 135 BPM, moderate variability, present accels, no decels TOCO: Ctx q 2-6 minutes  Labs:   Pending T&S, CBC, RPR  Recent Labs    08/10/19 1032  WBC 8.1  HGB 11.2*  HCT 35.7*  PLT 210       Assessment:  38 y.o. [id number] at [redacted]w[redacted]d  Medical induction - not yet in labor FHR category 1 GBS positive in urine Breast and bottle feeding planned Placenta disposal - to be discussed  Plan:  Admit to BS Routine L&D orders Foley balloon placement declined by patient - Plan for Pitocin augmentation then AROM GBS prophylaxis per protocol Analgesia/anesthesia PRN - Considering epidural for pain mangement  Anticipate NSVB  Dr. Adrian Blackwater notified of admission / plan of care   Lynnell Grain SNM, BSN 08/10/2019, 1:08 PM    I confirm that I have verified the information documented in the nurse midwife student's note and that I have also personally reperformed the history, physical exam and all medical decision making activities of this service and have verified that all service and findings are accurately documented in this student's note.    Thressa Sheller DNP, CNM  08/10/19  2:08 PM

## 2019-08-10 NOTE — Discharge Summary (Signed)
Postpartum Discharge Summary     Patient Name: Danielle Hoover DOB: 05-08-1981 MRN: [id number]  Date of admission: 08/10/2019 Delivering Provider: Juanna Cao   Date of discharge: 08/12/2019  Admitting diagnosis: Preg Intrauterine pregnancy: [redacted]w[redacted]d    Secondary diagnosis:  Active Problems:   Prior poor obstetrical history, antepartum   Encounter for induction of labor  Additional problems: None     Discharge diagnosis: Term Pregnancy Delivered                                                                                                Post partum procedures:Nexplanon insertion  Augmentation: AROM, Pitocin and Cytotec  Complications: None  Hospital course:  Induction of Labor With Vaginal Delivery   38y.o. yo [id number] admitted to the hospital 08/10/2019 for induction of labor.  Indication for induction: CHTN.  Patient had an uncomplicated labor course as follows: Membrane Rupture Time/Date: 5:33 PM ,08/10/2019   Intrapartum Procedures: Episiotomy: None [1]                                         Lacerations:  Labial [10]  Patient had delivery of a Viable infant.  Information for the patient's newborn:  KsMamoneGirl Calaya [[id number]]Delivery Method: Vaginal, Spontaneous(Filed from Delivery Summary)    08/10/2019  Details of delivery can be found in separate delivery note.  Patient had a routine postpartum course. Patient is discharged home 08/12/19. Delivery time: 7:55 PM    Magnesium Sulfate received: No BMZ received: No Rhophylac:N/A MMR:No Transfusion:No  Physical exam  Vitals:   08/11/19 0626 08/11/19 1419 08/11/19 2100 08/12/19 0545  BP: 115/65 133/80 116/74 122/68  Pulse: 72 (!) 56 65 62  Resp: 18 16 16 16   Temp: 98.1 F (36.7 C) (!) 97.5 F (36.4 C) 98.5 F (36.9 C) 98.1 F (36.7 C)  TempSrc: Oral Oral Oral Oral  SpO2: 100%  98%    General: alert, cooperative and no distress Lochia: appropriate Uterine Fundus: firm Incision: N/a DVT  Evaluation: No evidence of DVT seen on physical exam. Labs: Lab Results  Component Value Date   WBC 8.1 08/10/2019   HGB 11.2 (L) 08/10/2019   HCT 35.7 (L) 08/10/2019   MCV 83.8 08/10/2019   PLT 210 08/10/2019   CMP Latest Ref Rng & Units 02/02/2019  Glucose 65 - 99 mg/dL 101(H)  BUN 6 - 20 mg/dL 9  Creatinine 0.57 - 1.00 mg/dL 0.50(L)  Sodium 134 - 144 mmol/L 137  Potassium 3.5 - 5.2 mmol/L 3.8  Chloride 96 - 106 mmol/L 100  CO2 20 - 29 mmol/L 21  Calcium 8.7 - 10.2 mg/dL 9.4  Total Protein 6.0 - 8.5 g/dL 6.6  Total Bilirubin 0.0 - 1.2 mg/dL 0.4  Alkaline Phos 39 - 117 IU/L 51  AST 0 - 40 IU/L 22  ALT 0 - 32 IU/L 13    Discharge instruction: per After Visit Summary and "Baby and Me Booklet".  After visit meds:  Allergies as of 08/12/2019   No Known Allergies     Medication List    STOP taking these medications   [employer]S Aspirin Adult Low Dose 81 MG chewable tablet Generic drug: aspirin   metoCLOPramide 10 MG tablet Commonly known as: REGLAN     TAKE these medications   acetaminophen 325 MG tablet Commonly known as: Tylenol Take 2 tablets (650 mg total) by mouth every 4 (four) hours as needed (for pain scale < 4).   ibuprofen 600 MG tablet Commonly known as: ADVIL Take 1 tablet (600 mg total) by mouth every 6 (six) hours.   PrePLUS 27-1 MG Tabs Take 1 tablet by mouth daily.       Diet: routine diet  Activity: Advance as tolerated. Pelvic rest for 6 weeks.   Outpatient follow up:4 weeks Follow up Appt: Future Appointments  Date Time Provider [place]  09/16/2019 10:55 AM Rasch, Artist Pais, NP [facility]   Follow up Visit: [place] [facility] Follow up.   Specialty: Obstetrics and Gynecology Why: On September 16, 2019, as scheduled.  Contact information: [address] [place], [place] [id number] [place] [address] [phone]           Please schedule  this patient for Postpartum visit in: 4 weeks with the following provider: Any provider For C/S patients schedule nurse incision check in weeks 2 weeks: no High risk pregnancy complicated by: HTN Delivery mode:  SVD Anticipated Birth Control:  Nexplanon PP Procedures needed: BP check  Schedule Integrated BH visit: no      Newborn Data: Live born female  Birth Weight:  2605 g, 5lb 11.9 oz APGAR: 9, 9  Newborn Delivery   Birth date/time: 08/10/2019 19:55:00 Delivery type: Vaginal, Spontaneous      Baby Feeding: Breast Disposition:Infant staying additional day, mother to stay in room as baby patient

## 2019-08-11 DIAGNOSIS — Z30017 Encounter for initial prescription of implantable subdermal contraceptive: Secondary | ICD-10-CM

## 2019-08-11 LAB — RPR: RPR Ser Ql: NONREACTIVE

## 2019-08-11 MED ORDER — LIDOCAINE HCL 1 % IJ SOLN
0.0000 mL | Freq: Once | INTRAMUSCULAR | Status: AC | PRN
  Administered 2019-08-11: 1.5 mL via INTRADERMAL
  Filled 2019-08-11 (×2): qty 20

## 2019-08-11 MED ORDER — ETONOGESTREL 68 MG ~~LOC~~ IMPL
68.0000 mg | DRUG_IMPLANT | Freq: Once | SUBCUTANEOUS | Status: AC
  Administered 2019-08-11: 68 mg via SUBCUTANEOUS
  Filled 2019-08-11: qty 1

## 2019-08-11 NOTE — Progress Notes (Signed)
Patient had Nexplanon placed using interpreter # [phone] for [place] (FOB speaks this language and was present - no [place] interpreter available).

## 2019-08-11 NOTE — Lactation Note (Signed)
This note was copied from a baby's chart. Lactation Consultation Note  Patient Name: Danielle Hoover Today's Date: 08/11/2019 Reason for consult: Initial assessment;Early term 37-38.6wks;Infant < 6lbs P4, 11 hour female infant ETI, less than 6 lbs and weight loss -1% Mom's feeding choice is breast and formula feeding. [place] attempted to use [employer] fast streamer  for [employer] but was informed Mike Gip interpreter  is not available. Mom states she does speak [place], per mom she breastfeed her 1st child for 6 months, 2nd child for 2 months and 3rd child for one month. Mom is active on the [facility] program in [place]. Per mom, she had not put infant to breast she thought she did not have any colostrum to give infant. Mom agreeable to use DEBP pump and easily expressed 12 ml that was bottle feed to infant in a slow flow bottle nipple. Mom has soley been offering Similac Neosure 22kcal formula with iron  Since infant has been born but mom wants to breastfeed infant. Mom plans to latch infant to breast at next feeding first and then will use DEBP and give infant before offering formula. Mom will do as much STS as possible. Mom will breastfeed infant according cues, 8 to 12 times within 24 hours and not exceed 3 hours without breastfeeding infant. Mom will limit feeding to 30 minutes or less. Mom will ask for latch assistance if need from Nurse or Anderson.  Reviewed Baby & Me book's Breastfeeding Basics.  Mom made aware of O/P services, breastfeeding support groups, community resources, and our phone # for post-discharge questions.   Maternal Data Formula Feeding for Exclusion: Yes Reason for exclusion: Mother's choice to formula and breast feed on admission Has patient been taught Hand Expression?: Yes Does the patient have breastfeeding experience prior to this delivery?: Yes  Feeding Feeding Type: Breast Fed Nipple Type: Slow - flow  LATCH Score                    Interventions Interventions: Breast feeding basics reviewed;Skin to skin;Hand express;Breast massage;DEBP  Lactation Tools Discussed/Used Tools: Pump Breast pump type: Double-Electric Breast Pump WIC Program: Yes Pump Review: Setup, frequency, and cleaning;Milk Storage Initiated by:: Vicente Serene, IBCLC Date initiated:: 08/11/19   Consult Status Consult Status: Follow-up Date: 08/11/19 Follow-up type: In-patient    Vicente Serene 08/11/2019, 7:11 AM

## 2019-08-11 NOTE — Progress Notes (Signed)
Post Partum Day 1 Subjective: no complaints, up ad lib, voiding, tolerating PO and + flatus  Objective: Blood pressure 115/65, pulse 72, temperature 98.1 F (36.7 C), temperature source Oral, resp. rate 18, last menstrual period 11/24/2018, SpO2 100 %, unknown if currently breastfeeding.  Physical Exam:  General: alert, cooperative and no distress Lochia: appropriate Uterine Fundus: firm Incision: n/a DVT Evaluation: No evidence of DVT seen on physical exam.  Recent Labs    08/10/19 1032  HGB 11.2*  HCT 35.7*    Assessment/Plan: Plan for discharge tomorrow, Breastfeeding and Contraception Nexplanon placed today   LOS: 1 day   Danielle Hoover 08/11/2019, 11:28 AM

## 2019-08-11 NOTE — Progress Notes (Signed)

## 2019-08-12 ENCOUNTER — Ambulatory Visit (HOSPITAL_COMMUNITY): Payer: Medicaid Other

## 2019-08-12 ENCOUNTER — Encounter: Payer: BC Managed Care – PPO | Admitting: Obstetrics and Gynecology

## 2019-08-12 ENCOUNTER — Other Ambulatory Visit: Payer: BC Managed Care – PPO

## 2019-08-12 MED ORDER — ACETAMINOPHEN 325 MG PO TABS: 650.0000 mg | ORAL_TABLET | ORAL | Status: DC | PRN

## 2019-08-12 MED ORDER — IBUPROFEN 600 MG PO TABS: 600.0000 mg | ORAL_TABLET | Freq: Four times a day (QID) | ORAL | 0 refills | Status: DC

## 2019-08-12 MED ORDER — ASPIRIN 81 MG PO CHEW: 81.0000 mg | CHEWABLE_TABLET | Freq: Every day | ORAL | 0 refills | Status: AC

## 2019-08-12 NOTE — Discharge Instructions (Signed)
Danielle Hoover h?u s?n sau khi sinh th??ng Postpartum Care After Vaginal Delivery B?ng ny cung c?p cho qu v? thng tin v? cch Danielle [place] b?n thn t? lc sinh con cho ??n 6-12 tu?n sau khi sinh (th?i k? h?u s?n). Chuyn gia Danielle [place] s?c kh?e c?ng c th? c h??ng d?n c? th? h?n cho qu v?. Hy lin l?c v?i chuyn gia Danielle [place] s?c kh?e n?u qu v? c cc v?n ?? ho?c th?c m?c. Tun th? nh?ng h??ng d?n ny ? nh: Ch?y mu m ??o  Ch?y mu m ??o (s?n d?ch) sau khi sinh l bnh th??ng. Mang b?ng v? sinh ?? th?m mu v d?ch t? m ??o. ? Trong tu?n ??u tin sau khi sinh, l??ng v mu s?c c?a s?n d?ch th??ng t??ng t? v?i kinh nguy?t. ? Trong vi tu?n ti?p theo, n s? gi?m d?n thnh ch?t ti?t kh, mu vng nu. ? ??i v?i h?u h?t ph? n?, s?n d?ch ng?ng hon ton sau sinh 4-6 tu?n. Hi?n t??ng ch?y mu m ??o c th? khc nhau gi?a nh?ng ng??i khc nhau.  Thay b?ng v? sinh th??ng xuyn. Theo di b?t c? thay ??i no v? vi?c ra mu, ch?ng h?n nh?: ? ??t ng?t t?ng l??ng mu ch?y ra. ? Thay ??i mu s?c. ? C?c mu ?ng l?n.  N?u quy? vi? ra m?t cu?c ma?u ?ng ? m ??o, ha?y gi?? n la?i v g?i cho chuyn gia Danielle so?c s??c kho?e ?? trao ??i. Khng xa? tri ca?c cu?c ma?u ?ng xu?ng b?n c?u ma? ch?a ni v?i chuyn gia Danielle so?c s??c kho?e.  Khng s?? du?ng nu?t v? sinh ho??c thu?t r??a cho ??n khi chuyn gia Danielle so?c s??c kho?e no?i vi?c na?y la? an toa?n.  N?u qu v? khng nui con b?ng s?a m?, qu v? s? c kinh nguy?t tr? l?i trong vng 6-8 tu?n sau khi sinh con. N?u qu v? ch? cho con b s?a m? (nui con hon ton b?ng s?a m?), kinh nguy?t c th? khng c tr? l?i cho ??n khi qu v? ng?ng cho con b. Danielle [place] vng ?y ch?u  Gi? cho vng gi?a m ??o v h?u mn (?y ch?u) s?ch v kh theo ch? d?n c?a chuyn gia Danielle [place] s?c kh?e. S? d?ng b?ng t?m thu?c v thu?c x?t v kem c thu?c gi?m ?au theo ch? d?n.  N?u qu v? c v?t c?t ? ?y ch?u (c?t t?ng sinh mn) ho?c v?t rch trong m ??o, hy ki?m tra khu v?c ?  xem c d?u hi?u nhi?m trng khng cho ??n khi qu v? lnh l?i. Ki?m tra xem c: ? ??, s?ng n?, ho?c ?au nhi?u h?n. ? D?ch ho?c mu ch?y ? v?t c?t ho?c v?t rch. ? ?m. ? M? ho?c mi hi.  Qu v? c th? ???c cung c?p m?t bnh phun ?? s? d?ng thay v lau ?? lm s?ch vng ?y ch?u sau khi ?i v? sinh. Khi qu v? b?t ??u lnh l?i, qu v? c th? s? d?ng bnh phun tr??c khi t? lau. ??m b?o lau nh? nhng.  ?? gi?m ?au do th? thu?t c?t t?ng sinh mn, v?t rch ? m ??o, ho?c s?ng t?nh m?ch trong h?u mn (b?nh tr?), hy c? g?ng ngm mng trong n??c ?m 2-3 l?n m?i ngy. Ngm mng l ng?i vo trong m?t ch?u n??c ?m. N??c ch? c?n ng?p ??n hng qu v? v c?n ph?i ng?p mng. Danielle [place] v  Trong vng vi ngy ??u tin sau sinh,  v c?a qu v? c th? c?m gic n?ng, c?ng v kh ch?u (c??ng s?a). S?a ? v c?ng c th? r? ra. Chuyn gia Danielle [place] s?c kh?e c th? g?i  cch gip gi?m c?m gic kh ch?u. C??ng s?a c?n h?t trong vng m?t vi ngy.  N?u quy? vi? ?ang cho con b: ? M??c a?o ng??c h? tr?? cho vu? va? v??a v??n v??i quy? vi?. ? Gi? cho nm v c?a qu v? s?ch v kh. Bi kem ho?c thu?c m? theo ch? d?n c?a chuyn gia Danielle [place] s?c kh?e. ? Qu v? c th? c?n s? d?ng mi?ng lt th?m s?a ?? th?m s?a ? v r? ra. ? Qu v? c th? c cc c?n co t? cung m?i l?n cho con b cho ??n vi tu?n sau sinh. Cc c?n co t? cung gip cho t? cung tr? v? kch th??c bnh th??ng c?a n. ? N?u qu v? c b?t k? v?n ?? no v?i vi?c cho con b, hy lm vi?c v?i chuyn gia Danielle [place] s?c kh?e ho?c chuyn gia t? v?n nui con b?ng s?a m?.  N?u qu v? khng cho con b: ? Trnh Danielle nhi?u vo v. Lm nh? v?y c th? khi?n v s?n xu?t nhi?u s?a h?n. ? M?c o ng?c v?a v?n v s? d?ng ti ch??m l?nh ?? gip gi?m s?ng. ? Khng v?t (n?n) s?a. Vi?c ny khi?n qu v? t?o ra nhi?u s?a h?n. Quan h? thn m?t v tnh d?c  H?i chuyn gia Danielle so?c s??c kho?e cu?a quy? vi? v? vi?c khi na?o quy? vi? co? th? quan h? ti?nh du?c. ?i?u na?y c th? phu? thu?c  va?o: ? Nguy c? nhi?m tru?ng c?a qu v?. ? T?c ?? lnh l?i c?a qu v?. ? C?m gic thoa?i ma?i va? ham mu?n quan h? ti?nh du?c c?a qu v?.  Qu v? c th? c [place] sau khi sinh con, ngay c? khi qu v? ch?a th?y kinh nguy?t. N?u mu?n, hy trao ??i v?i chuyn gia Danielle [place] s?c kh?e v? cc ph??ng php ng?a [place] (trnh [place]). Thu?c  Ch? s? d?ng thu?c khng k ??n v thu?c k ??n theo ch? d?n c?a chuyn gia Danielle [place] s?c kh?e.  N?u qu v? ???c k thu?c khng sinh, hy dng thu?c theo ch? d?n c?a chuyn gia Danielle [place] s?c kh?e. Khng d?ng u?ng thu?c khng sinh ngay c? khi qu v? b?t ??u c?m th?y ?? h?n. Ho?t ??ng  D?n tr? l?i sinh ho?t bnh th??ng theo ch? d?n c?a chuyn gia Danielle [place] s?c kh?e. Hy h?i chuyn gia Danielle [place] s?c kh?e v? cc ho?t ??ng no l an ton cho qu v?.  Ngh? ng?i cng nhi?u cng t?t. C? g??ng nghi? ng?i ho?c ngu? ch??p m??t khi con quy? vi? ngu?. ?n v u?ng   U?ng ?? n??c ?? gi? cho n??c ti?u c mu vng nh?t.  ?n ca?c th??c ph?m nhi?u ch?t x? m?i nga?y. Nh? th? co? th? gip ng?n ng?a ho??c gia?m ta?o bo?n. Th??c ph?m nhi?u ch?t x? bao g?m: ? Ngu? c?c va? ba?nh mi? nguyn h?t. ? Ga?o l??t. ? ??u. ? Tri cy v rau c? t??i.  Khng c? g?ng gi?m cn nhanh b?ng cch gi?m l??ng calo h?p th?.  U?ng vitamin tr??c khi sinh c?a qu v? cho ??n khi khm s?c kh?e h?u s?n ho?c cho ??n khi chuyn gia Danielle [place] s?c kh?e ni qu v? c th? d?ng u?ng ???c r?i. L?i s?ng  Khng s? d?ng b?t k?  s?n ph?m no c nicotine ho?c thu?c l, ch?ng ha?n nh? thu?c l d?ng ht v thu?c l ?i?n t?. N?u qu v? c?n gip ?? ?? cai thu?c, hy h?i chuyn gia Danielle [place] s?c kh?e.  Khng u?ng bia r??u, ??c bi?t l n?u qu v? ?ang cho con b. H??ng d?n chung  Tun th? t?t c? cc l?n khm theo di cho quy? vi? va? con quy? vi? theo ch? d?n c?a chuyn gia Danielle [place] s?c kh?e. H?u h?t ph? n? ?i g?p chuyn gia Danielle [place] s?c kh?e ?? khm s?c kh?e h?u s?n trong vng 3-6 tu?n ??u sau khi sinh. Hy lin l?c v?i  chuyn gia Danielle [place] s?c kh?e n?u:  Qu v? c?m th?y khng th? ??i ph v?i nh?ng thay ??i m m?t ??a tr? mang l?i cho cu?c s?ng c?a qu v? v nh?ng c?m gic ny khng h?t.  Quy? vi? ca?m th?y bu?n ho??c lo l??ng b?t th???ng.  Vu? cu?a quy? vi? b? ??, ?au, ho?c c?ng.  Qu v? b? s?t.  Qu v? g?p kh kh?n v?i vi?c nh?n ti?u ho?c gi? ?? khng b? sn ti?u.  Qu v? t quan tm ho??c [employer] tm ??n ca?c hoa?t ??ng ma? quy? vi? t??ng thi?ch thu?.  Quy? vi? khng cho con bu? m?t cht no va? quy? vi? ch?a th?y kinh trong 12 tu?n sau khi sinh con.  Quy? vi? d??ng cho con bu? va? quy? vi? ch?a th?y kinh trong 12 tu?n sau khi ng??ng cho con bu?Sander Nephew v? c ca?c cu ho?i v? vi?c Danielle so?c ba?n thn ho?c con quy? vi?.  Qu v? c c?c mu ?ng tri t? m ??o ra. Yu c?u tr? gip ngay l?p t?c n?u:  Qu v? b? ?au ng?c.  Qu v? b? kh th?.  Qu v? b? ?au chn r?t nhi?u, ??t ng?t.  Qu v? b? ?au ho?c co th?t r?t nhi?u ? vng b?ng d??i.  Quy? vi? bi? cha?y ma?u ?? m ?a?o nhi?u ??n m??c th?m ???t h?n m?t mi?ng b?ng v? sinh trong m?t gi??. Ch?y mu khng ???c n?ng h?n giai ?o?n n?ng nh?t c?a qu v?.  Qu v? b? ?au ??u d? d?i.  Qu v? b? ng?t.  Qu v? b? m? m?t ho?c nhi?n th?y ??m.  Qu v? c d?ch m ??o mi hi.  Qu v? c  nghi? v? vi?c la?m t?n th??ng ba?n thn ho?c con quy? vi?. N?u qu v? t?ng c?m th?y nh? c th? t? lm t?n th??ng b?n thn ho?c ng??i khc, ho?c c suy ngh? v? vi?c t? t?, hy yu c?u s? tr? gip ngay l?p t?c. Qu v? c th? ??n phng c?p c?u g?n nh?t ho?c g?i cho:  D?ch v? c?p c?u t?i ??a ph??ng (911 ? [place]  ???ng dy tr? gip kh?ng ho?ng t? t?, nh? l ???ng dy C?u sinh Qu?c gia Ng?n ng?a T? t? theo s? [phone]. ???ng dy ny ho?t ??ng 24 gi? m?i ngy. Tm t?t  Kho?ng th?i gian ngay sau khi qu v? sinh em b cho ??n 6-12 tu?n sau khi sinh ???c g?i l th?i k? h?u s?n.  D?n tr? l?i sinh ho?t bnh th??ng theo ch? d?n c?a chuyn gia Danielle  [place] s?c kh?e.  Tun th? t?t c? cc l?n khm theo di cho quy? vi? va? con quy? vi? theo ch? d?n c?a chuyn gia Danielle  s?c kh?e. Thng tin ny khng nh?m m?c ?ch thay th? cho l?i [place] gia  Danielle [place] s?c kh?e ni v?i qu v?. Hy b?o ??m qu v? ph?i th?o lu?n b?t k? v?n ?? g m qu v? c v?i chuyn gia Danielle [place] s?c kh?e c?a qu v?. Document Released: 10/30/2017 Document Revised: 01/31/2018 Elsevier Patient Education  [place] s?n gi?t v s?n gi?t Preeclampsia and Eclampsia Ti?n s?n gi?t l m?t tnh tr?ng nghim tr?ng c th? pht sinh trong lc mang [place]. Tnh tr?ng ny gy ra huy?t p cao v t?ng protein trong n??c ti?u km theo nh?ng tri?u ch?ng khc, ch?ng h?n nh? ?au ??u v thay ??i th? l?c. Nh?ng tri?u ch?ng ? c th? x?y ra khi tnh tr?ng tr?m tr?ng h?n. Ti?n s?n gi?t c th? x?y ra ? tu?n th? 20 c?a [place] k? ho?c sau ?. Ch?n ?on v ?i?u tr? ti?n s?n gi?t s?m r?t quan tr?ng. N?u khng ?i?u tr? s?m, b?nh c th? gy nh?ng v?n ?? nghim tr?ng cho qu v? v con qu v?. M?t v?n ?? m n c th? d?n ??n l s?n gi?t. S?n gi?t l tnh tr?ng gy co gi?t ho?c rung l?c c? (co gi?t ho?c c?n gi?t) v cc v?n ?? nghim tr?ng khc ??i v?i ng??i m?. Trong qu trnh mang thai, sinh con ra c th? l cch ?i?u tr? t?t nh?t v?i ch?ng ti?n s?n gi?t ho?c s?n gi?t. ??i v?i h?u h?t ph? n?, cc tri?u ch?ng ti?n s?n gi?t v s?n gi?t th??ng h?t sau khi sinh con. Trong cc tr??ng h?p hi?m g?p, ph? n? c th? b? ti?n s?n gi?t sau khi sinh con (ti?n s?n gi?t h?u s?n). Tnh tr?ng ny th??ng x?y ra trong vng 48 gi? sau khi sinh con nh?ng c th? x?y ra cho ??n 6 tu?n sau khi sinh. Nguyn nhn g gy ra? Nguyn nhn gy ti?n s?n gi?t ch?a ???c bi?t ??n. ?i?u g lm t?ng nguy c?? Nh?ng y?u t? sau c th? lm qu v? d? b? ti?n s?n gi?t h?n:  C thai l?n ??u.  B? ti?n s?n gi?t trong [place] k? tr??c.  C ti?n s? gia ?nh b? ti?n s?n gi?t.  B? huy?t p cao.  Mang ?a [place].  Tu?i t? 35 tr?  ln.  L ng??i M? g?c Phi.  B? b?nh th?n ho?c ti?u ???ng.  C cc tnh tr?ng b?nh l khc nh? lupus ho?c b?nh v? mu.  Qu th?a cn (bo ph). Cc d?u hi?u ho?c tri?u ch?ng l g? Cc tri?u ch?ng ph? bi?n nh?t l:  ?au ??u r?t nhi?u.  Cc v?n ?? v? th? l?c, ch?ng h?n nhn m? ho?c nhn m?t thnh hai.  ?au b?ng, ??c bi?t l ?au ? b?ng trn. Cc tri?u ch?ng khc c th? pht sinh khi tnh tr?ng ny tr?m tr?ng h?n bao g?m:  T?ng cn ??t ng?t.  ??t ng?t s?ng bn tay, m?t, chn v bn chn.  Bu?n nn v nn m?a d? d?i.  T b ? m?t, cnh tay, chn v bn chn.  Chng m?t.  ?i ti?u t h?n so v?i bnh th??ng.  Ni l?p b?p.  Co gi?t ho?c c?n gi?t. Ch?n ?on tnh tr?ng ny nh? th? no? Khng c cc ki?m tra sng l?c v? ti?n s?n gi?t. Chuyn gia Danielle [place] s?c kh?e s? h?i qu v? v? cc tri?u ch?ng v ki?m tra cc d?u hi?u ti?n s?n gi?t trong nh?ng l?n khm tr??c sinh c?a qu v?. Qu v? c?ng c th? ???c lm cc ki?m tra, bao g?m:  Ki?m tra  huy?t p.  Xt nghi?m n??c ti?u ?? ki?m tra protein. Chuyn gia Danielle [place] s?c kh?e c?a qu v? s? ki?m tra l??ng ch?t ny vo m?i l?n khm tr??c sinh.  Xt nghi?m mu.  Theo di nh?p tim c?a con qu v?.  Siu m. Tnh tr?ng ny ???c ?i?u tr? nh? th? no? Qu v? v chuyn gia Danielle [place] s?c kh?e s? xc ??nh bi?n php ?i?u tr? t?t nh?t cho qu v?. ?i?u tr? c th? bao g?m:  Khm ti?n s?n th??ng xuyn h?n ?? ki?m tra xem c d?u hi?u ti?n s?n gi?t khng, qu v? c t?ng nguy c? b? ti?n s?n gi?t hay khng.  Thu?c h? huy?t p.  ? l?i b?nh vi?n n?u tnh tr?ng b?nh n?ng. ? ?, vi?c ?i?u tr? s? t?p trung vo ki?m sot huy?t p v l??ng d?ch trong c? th? qu v? (gi? n??c).  Dng thu?c (ma gi sulfate) ?? ng?n ng?a co gi?t. Thu?c ny c th? ???c cho tim ho?c truy?n qua ???ng truy?n t?nh m?ch (IV).  Dng aspirin li?u th?p trong khi mang [place].  Sinh non. Qu v? c th? ???c b?t ??u dng thu?c ?? chuy?n d? (kch ??), ho?c qu v? c th? ph?i m? l?y  [place]. Tun th? nh?ng h??ng d?n ny ? nh: ?n v u?ng   U?ng ?? n??c ?? gi? cho n??c ti?u c mu vng nh?t.  Trnh caffeine. L?i s?ng  Khng s? d?ng b?t k? s?n ph?m no c nicotine ho?c thu?c l, ch?ng ha?n nh? thu?c l d?ng ht v thu?c l ?i?n t?. N?u qu v? c?n gip ?? ?? cai thu?c, hy h?i chuyn gia Danielle [place] s?c kh?e.  Khng s? d?ng r??u hay ma ty.  Trnh c?ng th?ng cng nhi?u cng t?t. Ngh? ng?i v ng? th?t nhi?u. H??ng d?n chung  Ch? s? d?ng thu?c khng k ??n v thu?c k ??n theo ch? d?n c?a chuyn gia Danielle [place] s?c kh?e.  Khi n?m, hy n?m nghing sang tri. Vi?c ny gi? cho cc m?ch mu chnh c?a qu v? khng b? t ?.  Khi ng?i ho?c n?m xu?ng, hy nh?c (nng) bn chn qu v? ln. C? g?ng ?? vi [place]?c g?i bn d??i c?ng chn c?a qu v?.  T?p th? d?c th??ng xuyn. Hy h?i chuyn gia Danielle [place] s?c kh?e v? bi t?p no an ton cho qu v?.  Tun th? t?t c? cc cu?c h?n khm l?i v khm ti?n s?n theo ch? d?n c?a chuyn gia Danielle [place] s?c kh?e. ?i?u ny c vai tr quan tr?ng. Ng?n ng?a tnh tr?ng ny b?ng cch no? Ch?a c cch no ?? ng?n ng?a ti?n s?n gi?t ho?c s?n gi?t kh?i pht sinh. Tuy nhin, ?? gi?m b?t nguy c? bi cc bi?n ch?ng v pht hi?n s?m v?n ??:  Danielle [place] tr??c khi sinh ??nh k?. Chuyn gia Danielle [place] s?c kh?e c?a qu v? c th? ch?n ?on v ?i?u tr? s?m tnh tr?ng ny.  Duy tr cn n?ng c l?i cho s?c kh?e. ?? ngh? chuyn gia Danielle [place] s?c kh?e gip qu?n l vi?c t?ng cn trong qu trnh mang thai.  Trao ??i v?i chuyn gia Danielle [place] s?c kh?e ?? qu?n l b?t c? b?nh l no ko di (m?n tnh) m qu v? b?, ch?ng h?n nh? ti?u ???ng ho?c cc v?n ?? v? th?n.  Qu v? c th? c?n ph?i ki?m tra huy?t p v ch?c n?ng th?n sau sinh.  Chuyn gia Danielle  s?c kh?e c th? cho  qu v? dng thu?c aspirin li?u th?p trong l?n mang thai ti?p theo. Hy lin l?c v?i chuyn gia Danielle [place] s?c kh?e n?u:  Qu v? c tri?u ch?ng m chuyn gia Danielle [place] s?c kh?e ni l qu v? c th? c?n ?i?u tr? ho?c  theo di nhi?u h?n, ch?ng h?n nh?: ? ?au ??u. ? Bu?n nn ho?c nn. ? ?au b?ng. ? Chng m?t. ? Chong vngCathie Hoops. Yu c?u tr? gip ngay l?p t?c n?u:  Qu v? b? cc tnh tr?ng n?ng: ? ?au b?ng. ? ?au ??u khng ??. ? Chng m?t. ? V?n ?? v? th? l?c. ? L l?n. ? Bu?n nn ho?c nn.  Qu v? c b?t k? tri?u ch?ng no sau ?y: ? M?t c?n gi?t. ? T?ng cn nhanh, ??t ng?t. ? ??t ng?t s?ng tay, m?t c chn ho?c m?t. ? Kh c? ??ng b?t k? b? ph?n no c?a c? th?. ? T b ? b?t k? b? ph?n no trn c? th?. ? Kh ni. ? Ch?y mu b?t th??ng.  Qu v? b? ng?t. Tm t?t  Ti?n s?n gi?t l m?t tnh tr?ng nghim tr?ng c th? pht sinh trong lc mang [place].  Tnh tr?ng ny gy ra huy?t p cao v t?ng protein trong n??c ti?u km theo nh?ng tri?u ch?ng khc, ch?ng h?n nh? ?au ??u v thay ??i th? l?c.  Ch?n ?on v ?i?u tr? ti?n s?n gi?t s?m r?t quan tr?ng. N?u khng ?i?u tr? s?m, b?nh c th? gy nh?ng v?n ?? nghim tr?ng cho qu v? v con qu v?.  Tm s? tr? gip ngay l?p t?c n?u qu v? c tri?u ch?ng m chuyn gia Danielle [place] s?c kh?e ? yu c?u qu v? theo di. Thng tin ny khng nh?m m?c ?ch thay th? cho l?i khuyn m chuyn gia Danielle [place] s?c kh?e ni v?i qu v?. Hy b?o ??m qu v? ph?i th?o lu?n b?t k? v?n ?? g m qu v? c v?i chuyn gia Danielle [place] s?c kh?e c?a qu v?. Document Released: 10/21/2005 Document Revised: 07/22/2018 Document Reviewed: 07/22/2018 Elsevier Patient Education  2020 [employer]

## 2019-08-12 NOTE — Lactation Note (Signed)
This note was copied from a baby's chart. Lactation Consultation Note  Patient Name: Danielle Hoover Today's Date: 08/12/2019 Reason for consult: Follow-up assessment;Infant < 6lbs;Early term 20-38.6wks Baby is 46 hours old.  Mom is both breast and formula feeding baby.  She states baby latches without difficulty.  Mom is currently pumping with symphony pump and has obtained about 10 mls from each breast.  Breasts are soft.  Manual pump given for home use.  Instructed mom to put baby to breast first with cues prior to bottle feeding baby.  Instructed to call for assist prn.  Recommended post pumping every 3 hours giving baby expressed breast milk using slow flow nipple.  Maternal Data    Feeding Feeding Type: Bottle Fed - Formula Nipple Type: Slow - flow  LATCH Score                   Interventions    Lactation Tools Discussed/Used     Consult Status Consult Status: Follow-up Date: 08/13/19 Follow-up type: In-patient    Ave Filter 08/12/2019, 9:38 AM

## 2019-08-13 ENCOUNTER — Ambulatory Visit: Payer: Self-pay

## 2019-08-13 NOTE — Lactation Note (Signed)
This note was copied from a baby's chart. Lactation Consultation Note  Patient Name: Danielle Hoover Today's Date: 08/13/2019 Reason for consult: Follow-up assessment;Early term 37-38.6wks;Infant < 6lbs  F/U visit with P4 mom who delivered @ 76 wks, baby is now 40 hours old; anticipated discharge today.  LC entered room to find mom changing baby's diaper and asks when they can be discharged. Mom denies any problems with latching or breastfeeding; states her milk is in. Asked mom if she needs interpreter and mom denies at this time.  With mom's permission, LC palpated breasts and moderate engorgement noted. Mom with medium tight breasts and large tight nipples. Demonstrated hand expression and letdown noted prior to any stimulation. Mom denies any pain or discomfort with breasts or infant's latch. Mom states she has a manual pump given to her by the hospital staff.  LC attempted to assist mom with latch and infant very sleepy. Infant given EBM via LC's gloved finger with little interest from infant. Attempts made on both breasts in cradle position. Encouraged mom to use pillows for support and also emphasized importance of support for mom's back. Mom with tendency to lean off of bed and not use any pillows under her arms or baby's body. Mom with significant edema in left breast and nipple. Strongly encouraged mom to pump prior to leaving hospital if unable to latch baby. Mom states she will use the manual pump once she gets home.  Reviewed basic latch techniques and hand expression/breast massage during feedings. Reviewed prevention of further engorgement through pumping, hand expression, ice, and ibuprofen. Reviewed OP lactation services and lactation brochure with phone number.   Maternal Data Has patient been taught Hand Expression?: Yes Does the patient have breastfeeding experience prior to this delivery?: Yes  Feeding    LATCH Score Latch: Repeated attempts needed to sustain latch,  nipple held in mouth throughout feeding, stimulation needed to elicit sucking reflex.  Audible Swallowing: A few with stimulation  Type of Nipple: Everted at rest and after stimulation  Comfort (Breast/Nipple): Soft / non-tender  Hold (Positioning): No assistance needed to correctly position infant at breast.  LATCH Score: 8  Interventions Interventions: Assisted with latch;Skin to skin;Breast feeding basics reviewed;DEBP;Ice;Expressed milk;Position options;Support pillows;Hand pump  Lactation Tools Discussed/Used Breast pump type: Manual   Consult Status Consult Status: Complete    Cranston Neighbor 08/13/2019, 10:59 AM

## 2019-08-14 LAB — TYPE AND SCREEN
ABO/RH(D): AB POS
Antibody Screen: POSITIVE
Donor AG Type: NEGATIVE
Donor AG Type: NEGATIVE
Unit division: 0
Unit division: 0

## 2019-08-14 LAB — BPAM RBC
Blood Product Expiration Date: 202010302359
Blood Product Expiration Date: 202011012359
ISSUE DATE / TIME: 202010011342
Unit Type and Rh: 6200
Unit Type and Rh: 6200

## 2019-08-16 ENCOUNTER — Encounter: Payer: Self-pay | Admitting: *Deleted

## 2019-08-18 ENCOUNTER — Other Ambulatory Visit: Payer: BC Managed Care – PPO

## 2019-08-18 ENCOUNTER — Encounter: Payer: BC Managed Care – PPO | Admitting: Obstetrics and Gynecology

## 2019-09-16 ENCOUNTER — Ambulatory Visit (INDEPENDENT_AMBULATORY_CARE_PROVIDER_SITE_OTHER): Payer: BC Managed Care – PPO | Admitting: Obstetrics and Gynecology

## 2019-09-16 ENCOUNTER — Other Ambulatory Visit: Payer: Self-pay

## 2019-09-16 DIAGNOSIS — Z1389 Encounter for screening for other disorder: Secondary | ICD-10-CM

## 2019-09-16 DIAGNOSIS — O0990 Supervision of high risk pregnancy, unspecified, unspecified trimester: Secondary | ICD-10-CM

## 2019-09-16 MED ORDER — DOCUSATE SODIUM 100 MG PO CAPS: 100.0000 mg | ORAL_CAPSULE | Freq: Two times a day (BID) | ORAL | 0 refills | Status: DC

## 2019-09-16 MED ORDER — COCONUT OIL OIL: 1.0000 "application " | TOPICAL_OIL | 1 refills | Status: DC | PRN

## 2019-09-16 NOTE — Progress Notes (Signed)
Subjective:    Interpretor present:   Danielle Hoover is a 38 y.o. female who presents for a postpartum visit. She is 5 weeks 2 days postpartum following a spontaneous vaginal delivery. I have fully reviewed the prenatal and intrapartum course. The delivery was at 1 gestational weeks. Outcome: spontaneous vaginal delivery. Anesthesia: none. Postpartum course has been uncomplicated. Baby's course has been uncomplicated. Baby is feeding by breast. Bleeding no bleeding. Bowel function is abnormal: pt reports BM every 2-3 days. Bladder function is normal. Patient is sexually active. Contraception method is NexplanonPostpartum depression screening: negative. Has some itching near her left inner thigh near vagina.   The following portions of the patient's history were reviewed and updated as appropriate: allergies, current medications, past family history, past medical history, past social history, past surgical history and problem list.  Review of Systems Pertinent items are noted in HPI.   Objective:    BP 107/63   Pulse 75   Wt 117 lb (53.1 kg)   LMP 11/24/2018 (Exact Date)   BMI 25.32 kg/m   General:  alert and cooperative  Lungs: clear to auscultation bilaterally  Heart:  regular rate and rhythm, S1, S2 normal, no murmur, click, rub or gallop  Abdomen: soft, non-tender; bowel sounds normal; no masses,  no organomegaly   Vulva:  normal  Vagina: normal vagina  Cervix:  Not evaluated   Corpus: not examined  Adnexa:  Not examined   Rectal Exam: Not performed.        Assessment:   Normal postpartum exam. Pap smear not done at today's visit.  Pap normal 2018. PP constipation   Plan:   1. Contraception: Nexplanon 2. Rx: Colace  3. Itching likely 2/2 vaginal shaving. Rx: Coconut oil. Stop shaving. 3. Follow up as needed.    Noni Saupe I, NP 09/16/2019 2:09 PM

## 2019-09-16 NOTE — Progress Notes (Deleted)
  Subjective:     Danielle Hoover is a 38 y.o. female who presents for a postpartum visit. She is {1-10:13787} {time; units:18646} postpartum following a {delivery:12449}. I have fully reviewed the prenatal and intrapartum course. The delivery was at *** gestational weeks. Outcome: {delivery outcome:32078}. Anesthesia: {anesthesia types:812}. Postpartum course has been ***. Baby's course has been ***. Baby is feeding by {breast/bottle:69}. Bleeding {vag bleed:12292}. Bowel function is {normal:32111}. Bladder function is {normal:32111}. Patient {is/is not:9024} sexually active. Contraception method is {contraceptive method:5051}. Postpartum depression screening: {neg default:13464::"negative"}.  {Common ambulatory SmartLinks:19316}  Review of Systems {ros; complete:30496}   Objective:    LMP 11/24/2018 (Exact Date)   General:  {gen appearance:16600}   Breasts:  {breast exam:1202::"inspection negative, no nipple discharge or bleeding, no masses or nodularity palpable"}  Lungs: {lung exam:16931}  Heart:  {heart exam:5510}  Abdomen: {abdomen exam:16834}   Vulva:  {labia exam:12198}  Vagina: {vagina exam:12200}  Cervix:  {cervix exam:14595}  Corpus: {uterus exam:12215}  Adnexa:  {adnexa exam:12223}  Rectal Exam: {rectal/vaginal exam:12274}        Assessment:    *** postpartum exam. Pap smear {done:10129} at today's visit.   Plan:    1. Contraception: {method:5051} 2. *** 3. Follow up in: {1-10:13787} {time; units:19136} or as needed.

## 2019-09-16 NOTE — Progress Notes (Signed)
10:13a- Careers information officer for a [place] interpreter & was told no one available at this time & to call back in 30 minutes.   10:50a-Was told pt was in [place]

## 2019-10-19 ENCOUNTER — Encounter: Payer: Self-pay | Admitting: General Practice

## 2019-11-18 ENCOUNTER — Other Ambulatory Visit (INDEPENDENT_AMBULATORY_CARE_PROVIDER_SITE_OTHER): Payer: Self-pay | Admitting: Internal Medicine

## 2019-11-18 NOTE — Telephone Encounter (Signed)
Patient last seen by Dr. Laural Benes 11/23/18. Will defer to her to fill if appropriate.

## 2019-11-23 NOTE — Telephone Encounter (Signed)
Please contact pt and schedule an appointment with Danielle Hoover. She is a pt at [facility]

## 2019-11-24 NOTE — Telephone Encounter (Signed)
I have called patient to notify her that medication refill has been sent and to advice her that she would need to make an appointment for future refills. I was unsuccessful to get a hold of patient and I left a voicemail sking her to call back to the clinic to schedule appointment.    Raynelle Fanning

## 2020-05-15 ENCOUNTER — Other Ambulatory Visit: Payer: Self-pay | Admitting: Internal Medicine

## 2020-05-15 NOTE — Telephone Encounter (Signed)
hydrochlorothiazide (HYDRODIURIL) 12.5 MG tablet    Patient requesting refill.    Pharmacy:  [id number] [place], [place] - [address] AT [facility] [address] Phone:  [phone]  Fax:  [phone]

## 2020-05-15 NOTE — Telephone Encounter (Signed)
Requested medication (s) are due for refill today: no  Requested medication (s) are on the active medication list: yes  Last refill:  11/18/2019  Future visit scheduled: no  Notes to clinic:  patient is overdue for follow up and labs   Requested Prescriptions  Pending Prescriptions Disp Refills   hydrochlorothiazide (HYDRODIURIL) 12.5 MG tablet 30 tablet 0    Sig: Take 1 tablet (12.5 mg total) by mouth daily.      Cardiovascular: Diuretics - Thiazide Failed - 05/15/2020  8:35 AM      Failed - Ca in normal range and within 360 days    Calcium  Date Value Ref Range Status  02/02/2019 9.4 8.7 - 10.2 mg/dL Final          Failed - Cr in normal range and within 360 days    Creatinine, Ser  Date Value Ref Range Status  02/02/2019 0.50 (L) 0.57 - 1.00 mg/dL Final          Failed - K in normal range and within 360 days    Potassium  Date Value Ref Range Status  02/02/2019 3.8 3.5 - 5.2 mmol/L Final          Failed - Na in normal range and within 360 days    Sodium  Date Value Ref Range Status  02/02/2019 137 134 - 144 mmol/L Final          Failed - Valid encounter within last 6 months    Recent Outpatient Visits           1 year ago Acute vaginitis   [facility] Marcine Matar, MD   1 year ago Essential hypertension   [facility] Loletta Specter, PA-C   2 years ago Hypertension, unspecified type   [facility] Loletta Specter, PA-C              Passed - Last BP in normal range    BP Readings from Last 1 Encounters:  09/16/19 107/63

## 2020-09-18 ENCOUNTER — Ambulatory Visit (INDEPENDENT_AMBULATORY_CARE_PROVIDER_SITE_OTHER): Payer: Medicaid Other | Admitting: Primary Care

## 2020-09-18 ENCOUNTER — Other Ambulatory Visit: Payer: Self-pay

## 2020-09-18 ENCOUNTER — Encounter (INDEPENDENT_AMBULATORY_CARE_PROVIDER_SITE_OTHER): Payer: Self-pay | Admitting: Primary Care

## 2020-09-18 VITALS — BP 149/86 | HR 67 | Temp 97.3°F | Ht <= 58 in | Wt 133.2 lb

## 2020-09-18 DIAGNOSIS — I10 Essential (primary) hypertension: Secondary | ICD-10-CM

## 2020-09-18 DIAGNOSIS — R519 Headache, unspecified: Secondary | ICD-10-CM

## 2020-09-18 DIAGNOSIS — Z7689 Persons encountering health services in other specified circumstances: Secondary | ICD-10-CM

## 2020-09-18 DIAGNOSIS — H04129 Dry eye syndrome of unspecified lacrimal gland: Secondary | ICD-10-CM

## 2020-09-18 MED ORDER — CVS NATURAL TEARS 0.1-0.3 % OP SOLN: 1.0000 [drp] | Freq: Every day | OPHTHALMIC | 1 refills | Status: DC | PRN

## 2020-09-18 MED ORDER — NIFEDIPINE ER OSMOTIC RELEASE 30 MG PO TB24: 30.0000 mg | ORAL_TABLET | Freq: Every day | ORAL | 0 refills | Status: DC

## 2020-09-18 MED ORDER — IBUPROFEN 600 MG PO TABS: 600.0000 mg | ORAL_TABLET | Freq: Four times a day (QID) | ORAL | 0 refills | Status: DC

## 2020-09-18 NOTE — Patient Instructions (Signed)

## 2020-09-18 NOTE — Progress Notes (Signed)
New Patient Office Visit  Subjective:  Patient ID: Danielle Hoover, female    DOB: 26-Oct-1981  Age: 39 y.o. MRN: [id number]  CC:  Chief Complaint  Patient presents with  . New Patient (Initial Visit)    hypertension     HPI [name] is a 70 year [place] speaks Danielle Hoover  used  presents for , who is a patient of Dr. Laural Benes at [facility] who presents to establish care. Management of hypertension. Denies shortness of breath, chest pain or lower extremity edema. She has been having a headache for 2 weeks frontal and temporal . Throbbing 5/10.   Past Medical History:  Diagnosis Date  . Anemia    during pregnancy 2006  . Asthma   . Headache   . Hx of chlamydia infection 2016  . Hypertension     Past Surgical History:  Procedure Laterality Date  . NO PAST SURGERIES      Family History  Problem Relation Age of Onset  . Alcohol abuse Neg Hx   . Arthritis Neg Hx   . Asthma Neg Hx   . Birth defects Neg Hx   . Cancer Neg Hx   . COPD Neg Hx   . Depression Neg Hx   . Diabetes Neg Hx   . Drug abuse Neg Hx   . Early death Neg Hx   . Hearing loss Neg Hx   . Heart disease Neg Hx   . Hyperlipidemia Neg Hx   . Hypertension Neg Hx   . Kidney disease Neg Hx   . Learning disabilities Neg Hx   . Mental illness Neg Hx   . Mental retardation Neg Hx   . Miscarriages / Stillbirths Neg Hx   . Stroke Neg Hx   . Vision loss Neg Hx   . Varicose Veins Neg Hx     Social History   Socioeconomic History  . Marital status: Divorced    Spouse name: Not on file  . Number of children: Not on file  . Years of education: Not on file  . Highest education level: Not on file  Occupational History  . Not on file  Tobacco Use  . Smoking status: Never Smoker  . Smokeless tobacco: Never Used  Substance and Sexual Activity  . Alcohol use: No  . Drug use: No  . Sexual activity: Yes    Birth control/protection: None    Comment: last week  Other Topics Concern  . Not on file  Social  History Narrative   Makes socks   Not married- has a boy friend   Four children. Recent pregnancy    Social Determinants of Health   Financial Resource Strain:   . Difficulty of Paying Living Expenses: Not on file  Food Insecurity:   . Worried About Programme researcher, broadcasting/film/video in the Last Year: Not on file  . Ran Out of Food in the Last Year: Not on file  Transportation Needs:   . Lack of Transportation (Medical): Not on file  . Lack of Transportation (Non-Medical): Not on file  Physical Activity:   . Days of Exercise per Week: Not on file  . Minutes of Exercise per Session: Not on file  Stress:   . Feeling of Stress : Not on file  Social Connections:   . Frequency of Communication with Friends and Family: Not on file  . Frequency of Social Gatherings with Friends and Family: Not on file  . Attends Religious Services: Not on  file  . Active Member of Clubs or Organizations: Not on file  . Attends Banker Meetings: Not on file  . Marital Status: Not on file  Intimate Partner Violence:   . Fear of Current or Ex-Partner: Not on file  . Emotionally Abused: Not on file  . Physically Abused: Not on file  . Sexually Abused: Not on file    ROS Review of Systems  Eyes: Positive for visual disturbance.  Neurological: Positive for headaches.  All other systems reviewed and are negative.   Objective:   Today's Vitals: BP (!) 149/86 (BP Location: Right Arm, Patient Position: Sitting, Cuff Size: Normal)   Pulse 67   Temp (!) 97.3 F (36.3 C) (Temporal)   Ht 4\' 9"  (1.448 m)   Wt 133 lb 3.2 oz (60.4 kg)   LMP 08/18/2020 (Approximate)   SpO2 98%   Breastfeeding Yes   BMI 28.82 kg/m   Physical Exam Eddy was seen today for new patient (initial visit).  Diagnoses and all orders for this visit:  Essential hypertension -     NIFEdipine (PROCARDIA XL) 30 MG 24 hr tablet; Take 1 tablet (30 mg total) by mouth daily.  Encounter to establish care 08/20/2020, NP-C will be  your  (PCP) she is mastered prepared . Able to diagnosed and treatment also  answer health concern as well as continuing care of varied medical conditions, not limited by cause, organ system, or diagnosis.   Nonintractable headache, unspecified chronicity pattern, unspecified headache type -     ibuprofen (ADVIL) 600 MG tablet; Take 1 tablet (600 mg total) by mouth every 6 (six) hours.  Dry eye -     Dextran 70-Hypromellose, PF, ([employer] NATURAL TEARS) 0.1-0.3 % SOLN; Apply 1-2 drops to eye daily as needed.   Assessment & Plan:   Problem List Items Addressed This Visit    None      Outpatient Encounter Medications as of 09/18/2020  Medication Sig  . coconut oil OIL Apply 1 application topically as needed. (Patient not taking: Reported on 09/18/2020)  . hydrochlorothiazide (HYDRODIURIL) 12.5 MG tablet TAKE 1 TABLET BY MOUTH EVERY DAY (Patient not taking: Reported on 09/18/2020)  . [DISCONTINUED] acetaminophen (TYLENOL) 325 MG tablet Take 2 tablets (650 mg total) by mouth every 4 (four) hours as needed (for pain scale < 4). (Patient not taking: Reported on 09/16/2019)  . [DISCONTINUED] docusate sodium (COLACE) 100 MG capsule Take 1 capsule (100 mg total) by mouth 2 (two) times daily.  . [DISCONTINUED] ibuprofen (ADVIL) 600 MG tablet Take 1 tablet (600 mg total) by mouth every 6 (six) hours. (Patient not taking: Reported on 09/16/2019)  . [DISCONTINUED] Prenatal Vit-Fe Fumarate-FA (PREPLUS) 27-1 MG TABS Take 1 tablet by mouth daily.   No facility-administered encounter medications on file as of 09/18/2020.    Follow-up: No follow-ups on file.   09/20/2020, NP

## 2020-11-13 ENCOUNTER — Ambulatory Visit (INDEPENDENT_AMBULATORY_CARE_PROVIDER_SITE_OTHER): Payer: Medicaid Other | Admitting: Primary Care

## 2020-12-09 ENCOUNTER — Other Ambulatory Visit (INDEPENDENT_AMBULATORY_CARE_PROVIDER_SITE_OTHER): Payer: Self-pay | Admitting: Primary Care

## 2020-12-09 DIAGNOSIS — I10 Essential (primary) hypertension: Secondary | ICD-10-CM

## 2021-02-06 ENCOUNTER — Encounter (INDEPENDENT_AMBULATORY_CARE_PROVIDER_SITE_OTHER): Payer: Self-pay | Admitting: Primary Care

## 2021-02-06 ENCOUNTER — Ambulatory Visit (INDEPENDENT_AMBULATORY_CARE_PROVIDER_SITE_OTHER): Payer: Medicaid Other | Admitting: Primary Care

## 2021-02-06 ENCOUNTER — Other Ambulatory Visit: Payer: Self-pay

## 2021-02-06 VITALS — BP 128/87 | HR 106 | Temp 93.6°F | Ht <= 58 in | Wt 123.2 lb

## 2021-02-06 DIAGNOSIS — I10 Essential (primary) hypertension: Secondary | ICD-10-CM

## 2021-02-06 DIAGNOSIS — Z131 Encounter for screening for diabetes mellitus: Secondary | ICD-10-CM

## 2021-02-06 DIAGNOSIS — Z760 Encounter for issue of repeat prescription: Secondary | ICD-10-CM | POA: Diagnosis not present

## 2021-02-06 DIAGNOSIS — M25561 Pain in right knee: Secondary | ICD-10-CM | POA: Diagnosis not present

## 2021-02-06 LAB — POCT GLYCOSYLATED HEMOGLOBIN (HGB A1C): Hemoglobin A1C: 5 % (ref 4.0–5.6)

## 2021-02-06 MED ORDER — DICLOFENAC SODIUM 1 % EX GEL: 2.0000 g | Freq: Four times a day (QID) | CUTANEOUS | 1 refills | Status: DC

## 2021-02-06 MED ORDER — NIFEDIPINE ER OSMOTIC RELEASE 30 MG PO TB24: 30.0000 mg | ORAL_TABLET | Freq: Every day | ORAL | 1 refills | Status: DC

## 2021-02-06 NOTE — Progress Notes (Signed)
[facility]   [name] is a 40 year old [place] female ( interpreter present [name]) for hypertension management Denies shortness of breath, headaches, chest pain or lower extremity edema, sudden onset, vision changes, unilateral weakness, dizziness, paresthesias, Patient reports adherence with medications.  Current Medication List Current Outpatient Medications on File Prior to Visit  Medication Sig Dispense Refill  . coconut oil OIL Apply 1 application topically as needed. 120 mL 1  . Dextran 70-Hypromellose, PF, ([employer] NATURAL TEARS) 0.1-0.3 % SOLN Apply 1-2 drops to eye daily as needed. 32 each 1  . ibuprofen (ADVIL) 600 MG tablet Take 1 tablet (600 mg total) by mouth every 6 (six) hours. 30 tablet 0  . NIFEdipine (PROCARDIA-XL/NIFEDICAL-XL) 30 MG 24 hr tablet TAKE 1 TABLET BY MOUTH EVERY DAY 90 tablet 0   No current facility-administered medications on file prior to visit.   Past Medical History  Past Medical History:  Diagnosis Date  . Anemia    during pregnancy 2006  . Asthma   . Headache   . Hx of chlamydia infection 2016  . Hypertension    Dietary habits include: Dash diet  Exercise habits include: yes walking  Family / Social history: No  ASCVD risk factors include- [place]  O:  Physical Exam Vitals reviewed.  Constitutional:      Appearance: Normal appearance. She is normal weight.  HENT:     Right Ear: Tympanic membrane and external ear normal.     Left Ear: Tympanic membrane and external ear normal.     Nose: Nose normal.  Eyes:     Extraocular Movements: Extraocular movements intact.     Pupils: Pupils are equal, round, and reactive to light.  Cardiovascular:     Rate and Rhythm: Normal rate and regular rhythm.  Pulmonary:     Effort: Pulmonary effort is normal.     Breath sounds: Normal breath sounds.  Abdominal:     General: Abdomen is flat. Bowel sounds are normal.     Palpations: Abdomen is soft.  Musculoskeletal:        General:  Normal range of motion.     Cervical back: Normal range of motion.  Skin:    General: Skin is warm and dry.  Neurological:     Mental Status: She is alert and oriented to person, place, and time.  Psychiatric:        Mood and Affect: Mood normal.        Behavior: Behavior normal.        Thought Content: Thought content normal.        Judgment: Judgment normal.      Review of Systems  Musculoskeletal:       Right knee pain no trauma   All other systems reviewed and are negative.   Last 3 Office BP readings: BP Readings from Last 3 Encounters:  02/06/21 128/87  09/18/20 (!) 149/86  09/16/19 107/63    BMET    Component Value Date/Time   NA 137 02/02/2019 1604   K 3.8 02/02/2019 1604   CL 100 02/02/2019 1604   CO2 21 02/02/2019 1604   GLUCOSE 101 (H) 02/02/2019 1604   GLUCOSE 80 06/22/2015 1051   BUN 9 02/02/2019 1604   CREATININE 0.50 (L) 02/02/2019 1604   CALCIUM 9.4 02/02/2019 1604   GFRNONAA 125 02/02/2019 1604   GFRAA 144 02/02/2019 1604    Renal function: CrCl cannot be calculated (Patient's most recent lab result is older than the maximum 21 days  allowed.).  Clinical ASCVD: No  The ASCVD Risk score Denman George DC Jr., et al., 2013) failed to calculate for the following reasons:   The 2013 ASCVD risk score is only valid for ages 1 to 60  Danielle Hoover was seen today for hypertension. A/P: Essential hypertension Hypertension longstanding diagnosed currently on  Procardia XL 30mg  current medications. BP Goal at goal 130/80 mmHg.Close to goal 128/87.  Patient is adherent with current medications.  -Continued  -F/u labs ordered - CBC, CMP  -Counseled on lifestyle modifications for blood pressure control including reduced dietary sodium, increased exercise, adequate sleep  Screening for diabetes mellitus -     HgB A1c 5.0 per ADA guidelines she is not a diabetic   Acute pain of right knee  May alternate with heat and ice application for pain relief. May Voltaren gel as  prescribed pain relief. Other alternatives include water aerobics.  You must stay active and avoid a sedentary lifestyle.  Medication refill NIFEdipine (PROCARDIA-XL/NIFEDICAL-XL) 30 MG 24 hr tablet #90   

## 2021-02-11 MED ORDER — NIFEDIPINE ER OSMOTIC RELEASE 30 MG PO TB24: 30.0000 mg | ORAL_TABLET | Freq: Every day | ORAL | 1 refills | Status: DC

## 2021-03-27 ENCOUNTER — Ambulatory Visit (INDEPENDENT_AMBULATORY_CARE_PROVIDER_SITE_OTHER): Payer: Medicaid Other | Admitting: Family Medicine

## 2021-04-04 ENCOUNTER — Other Ambulatory Visit: Payer: Self-pay

## 2021-04-04 ENCOUNTER — Ambulatory Visit (INDEPENDENT_AMBULATORY_CARE_PROVIDER_SITE_OTHER): Payer: Medicaid Other | Admitting: Primary Care

## 2021-04-04 ENCOUNTER — Other Ambulatory Visit (HOSPITAL_COMMUNITY)
Admission: RE | Admit: 2021-04-04 | Discharge: 2021-04-04 | Disposition: A | Discharge status: Home / Self Care | Payer: Medicaid Other | Source: Ambulatory Visit | Attending: Primary Care | Admitting: Primary Care

## 2021-04-04 ENCOUNTER — Encounter (INDEPENDENT_AMBULATORY_CARE_PROVIDER_SITE_OTHER): Payer: Self-pay | Admitting: Primary Care

## 2021-04-04 VITALS — BP 148/83 | HR 82 | Temp 97.3°F | Ht <= 58 in | Wt 133.6 lb

## 2021-04-04 DIAGNOSIS — N898 Other specified noninflammatory disorders of vagina: Secondary | ICD-10-CM | POA: Diagnosis not present

## 2021-04-04 DIAGNOSIS — I10 Essential (primary) hypertension: Secondary | ICD-10-CM | POA: Diagnosis not present

## 2021-04-04 DIAGNOSIS — J301 Allergic rhinitis due to pollen: Secondary | ICD-10-CM

## 2021-04-04 DIAGNOSIS — Z124 Encounter for screening for malignant neoplasm of cervix: Secondary | ICD-10-CM | POA: Diagnosis not present

## 2021-04-04 MED ORDER — LORATADINE 10 MG PO TABS: 10.0000 mg | ORAL_TABLET | Freq: Every day | ORAL | 11 refills | Status: DC

## 2021-04-04 MED ORDER — HYDROCHLOROTHIAZIDE 25 MG PO TABS: 25.0000 mg | ORAL_TABLET | Freq: Every day | ORAL | 3 refills | Status: DC

## 2021-04-04 NOTE — Progress Notes (Signed)
   WELL-WOMAN PHYSICAL & PAP Patient name: Danielle Hoover MRN [id number]  Date of birth: [birth year] Chief Complaint:   Gynecologic Exam  History of Present Illness:   Danielle Hoover is a 40 y.o. [phone] female being seen today for a routine well-woman exam.  Current complaints: headache , watery eyes   PCP: Danielle Hoover  No LMP recorded. Patient has had an implant. The current method of family planning is Nexplanon  Review of Systems:   Pertinent items are noted in HPI Denies any , blurred vision, fatigue, shortness of breath, chest pain, abdominal pain, abnormal/itching/odor/irritation, problems with periods, bowel movements, urination, or intercourse, complains of  vaginal discharge and head aches  unless otherwise stated above. Pertinent History Reviewed:  Reviewed past medical,surgical, social and family history.  Reviewed problem list, medications and allergies. Physical Assessment:   Vitals:   04/04/21 1448  BP: (!) 148/83  Pulse: 82  Temp: (!) 97.3 F (36.3 C)  TempSrc: Temporal  SpO2: 98%  Weight: 133 lb 9.6 oz (60.6 kg)  Height: 4\' 9"  (1.448 m)  Body mass index is 28.91 kg/m.        Physical Examination:   General appearance - well appearing, and in no distress  Mental status - alert, oriented to person, place, and time  Psych:  She has a normal mood and affect  Skin - warm and dry, normal color, no suspicious lesions noted  Chest - effort normal, all lung fields clear to auscultation bilaterally  Heart - normal rate and regular rhythm  Neck:  midline trachea, no thyromegaly or nodules  Breasts - breasts appear normal, no suspicious masses, no skin or nipple changes or axillary nodes   Educated patient on proper self breast examination and had patient to demonstrate SBE.  Abdomen - soft, nontender, nondistended, no masses or organomegaly  Pelvic - VULVA: normal appearing vulva with no masses, tenderness or lesions   VAGINA: normal appearing vagina with normal color and  discharge, no lesions   CERVIX: normal appearing cervix without discharge or lesions, no CMT   Thin prep pap is done    UTERUS: uterus is felt to be normal size, shape, consistency and nontender    ADNEXA: No adnexal masses or tenderness noted.  Extremities:  No swelling or varicosities noted  No results found for this or any previous visit (from the past 24 hour(s)).  Assessment & Plan:  Danielle Hoover was seen today for gynecologic exam.  Diagnoses and all orders for this visit:  Essential hypertension Counseled on blood pressure goal of less than 130/80, low-sodium, DASH diet, medication compliance, 150 minutes of moderate intensity exercise per week. Discussed medication compliance, adverse effects. -     hydrochlorothiazide (HYDRODIURIL) 25 MG tablet; Take 1 tablet (25 mg total) by mouth daily.  Cervical cancer screening -     Cytology - PAP([place])  Vaginal discharge -     Cervicovaginal ancillary only  Seasonal allergic rhinitis due to pollen -     loratadine (CLARITIN) 10 MG tablet; Take 1 tablet (10 mg total) by mouth daily.   Labs/procedures today: Cervicovaginal ancillary andCytology - PAP  Meds: added HCTZ 25mg    Follow-up: Return in about 5 weeks (around 05/09/2021) for BPf/u only .  , NP 04/04/2021 3:07 PM

## 2021-04-05 LAB — CERVICOVAGINAL ANCILLARY ONLY
Bacterial Vaginitis (gardnerella): NEGATIVE
Candida Glabrata: NEGATIVE
Candida Vaginitis: NEGATIVE
Chlamydia: NEGATIVE
Comment: NEGATIVE
Comment: NEGATIVE
Comment: NEGATIVE
Comment: NEGATIVE
Comment: NEGATIVE
Comment: NORMAL
Neisseria Gonorrhea: NEGATIVE
Trichomonas: NEGATIVE

## 2021-04-06 LAB — CYTOLOGY - PAP
Diagnosis: NEGATIVE
Diagnosis: REACTIVE

## 2021-04-20 ENCOUNTER — Emergency Department (HOSPITAL_COMMUNITY): Payer: Medicaid Other

## 2021-04-20 ENCOUNTER — Emergency Department (HOSPITAL_COMMUNITY)
Admission: EM | Admit: 2021-04-20 | Discharge: 2021-04-20 | Disposition: A | Discharge status: Home / Self Care | Payer: Medicaid Other | Attending: Emergency Medicine | Admitting: Emergency Medicine

## 2021-04-20 DIAGNOSIS — M542 Cervicalgia: Secondary | ICD-10-CM | POA: Insufficient documentation

## 2021-04-20 DIAGNOSIS — R519 Headache, unspecified: Secondary | ICD-10-CM | POA: Insufficient documentation

## 2021-04-20 DIAGNOSIS — J45909 Unspecified asthma, uncomplicated: Secondary | ICD-10-CM | POA: Insufficient documentation

## 2021-04-20 DIAGNOSIS — Y92410 Unspecified street and highway as the place of occurrence of the external cause: Secondary | ICD-10-CM | POA: Insufficient documentation

## 2021-04-20 DIAGNOSIS — M545 Low back pain, unspecified: Secondary | ICD-10-CM | POA: Diagnosis not present

## 2021-04-20 DIAGNOSIS — R1030 Lower abdominal pain, unspecified: Secondary | ICD-10-CM | POA: Diagnosis not present

## 2021-04-20 DIAGNOSIS — Z79899 Other long term (current) drug therapy: Secondary | ICD-10-CM | POA: Diagnosis not present

## 2021-04-20 DIAGNOSIS — I10 Essential (primary) hypertension: Secondary | ICD-10-CM | POA: Diagnosis not present

## 2021-04-20 LAB — PREGNANCY, URINE: Preg Test, Ur: NEGATIVE

## 2021-04-20 MED ORDER — IBUPROFEN 600 MG PO TABS: 600.0000 mg | ORAL_TABLET | Freq: Three times a day (TID) | ORAL | 0 refills | Status: DC | PRN

## 2021-04-20 MED ORDER — CYCLOBENZAPRINE HCL 10 MG PO TABS: 10.0000 mg | ORAL_TABLET | Freq: Two times a day (BID) | ORAL | 0 refills | Status: DC | PRN
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
## 2021-04-30 IMAGING — US US MFM OB FOLLOW UP
1 series · 14 of 28 positions shown · non-contrast
Comparison: none

[Series 1: us mfm ob follow up · 46 acquisitions, 14 frames shown]
[im 2/46]
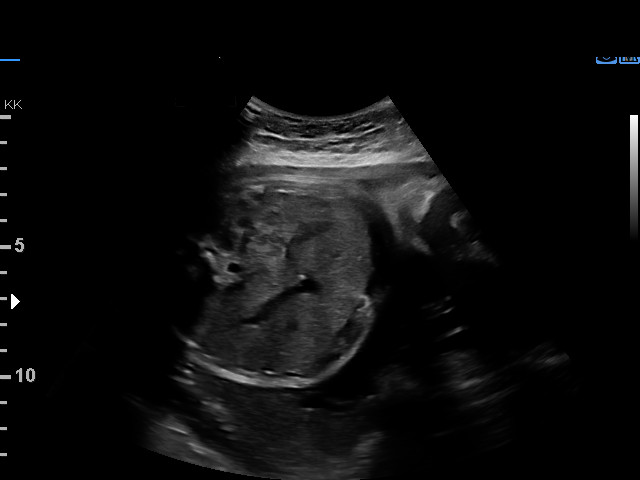
[im 6/46]
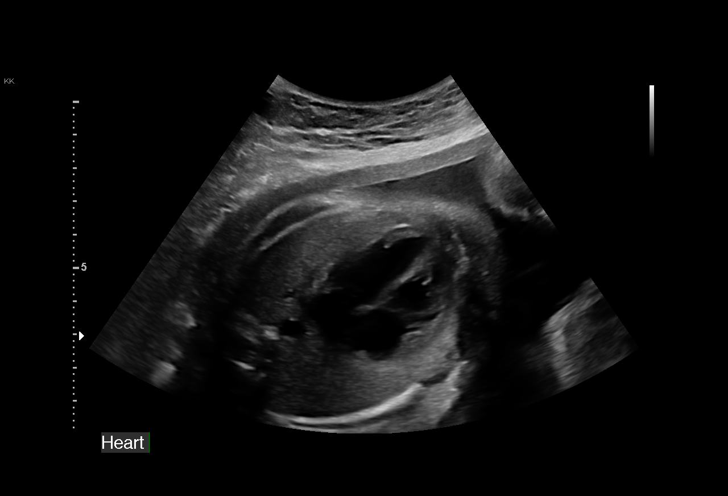
[im 9/46]
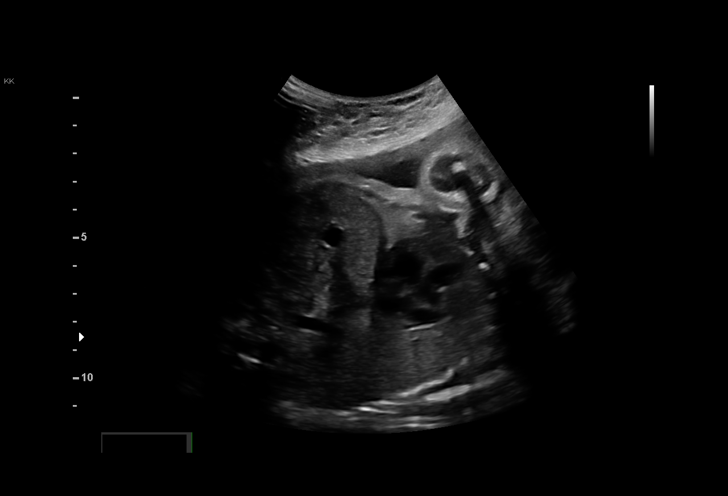
[im 12/46]
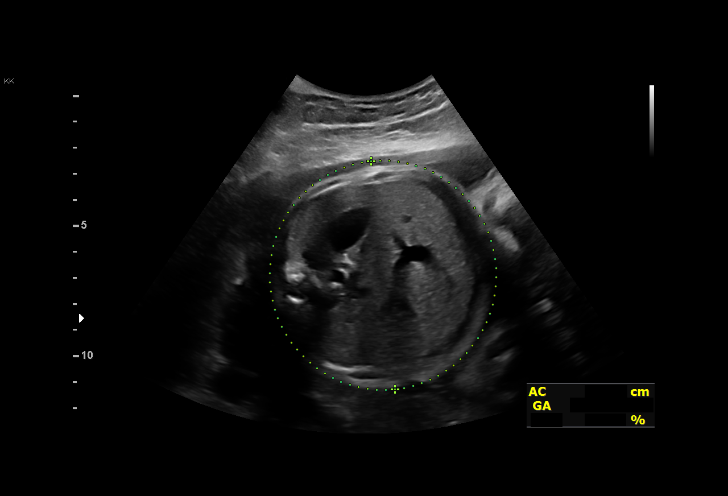
[im 16/46]
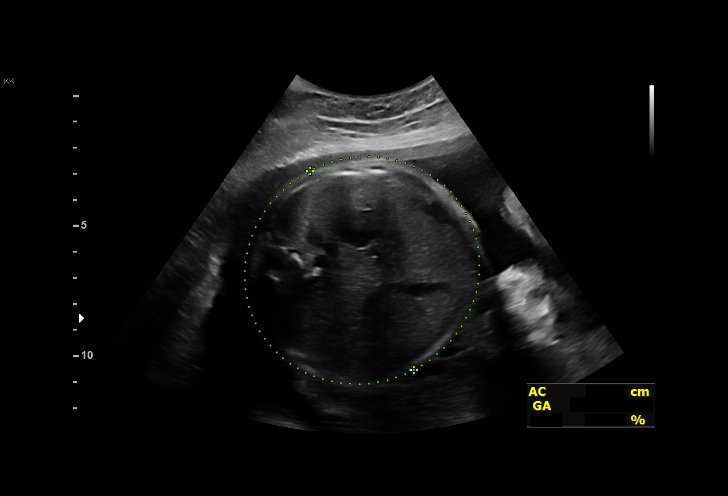
[im 19/46]
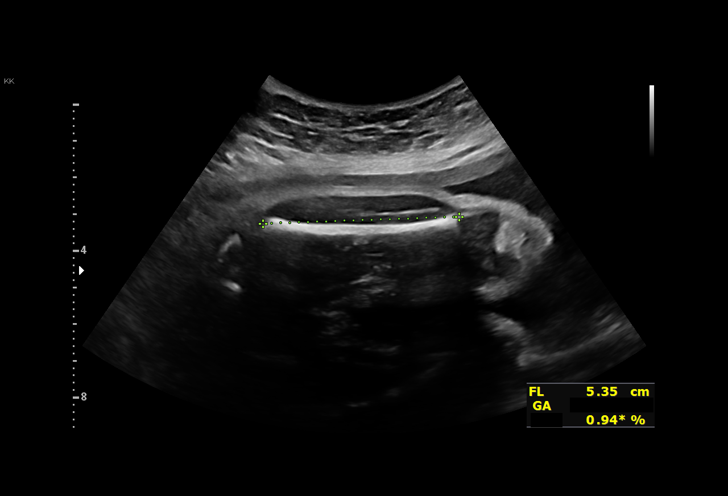
[im 22/46]
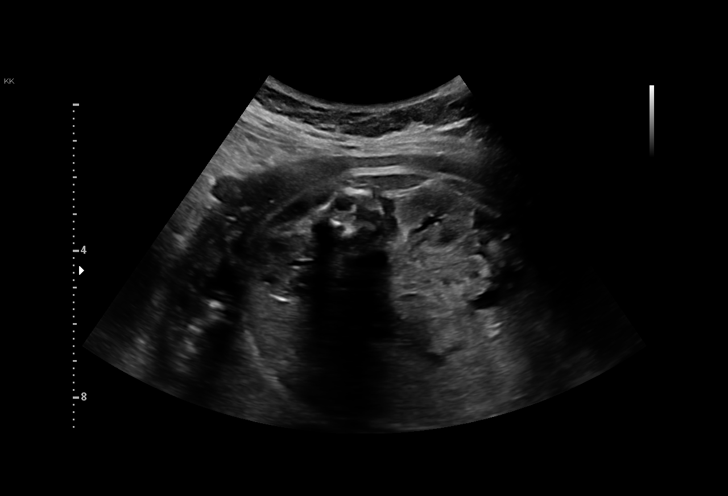
[im 26/46]
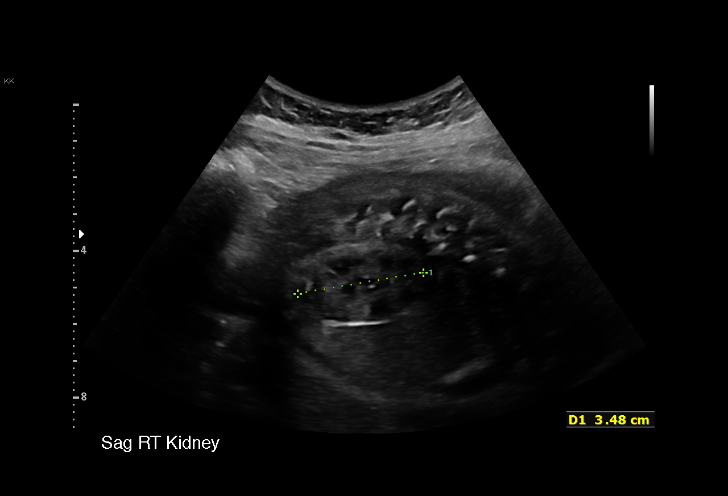
[im 29/46]
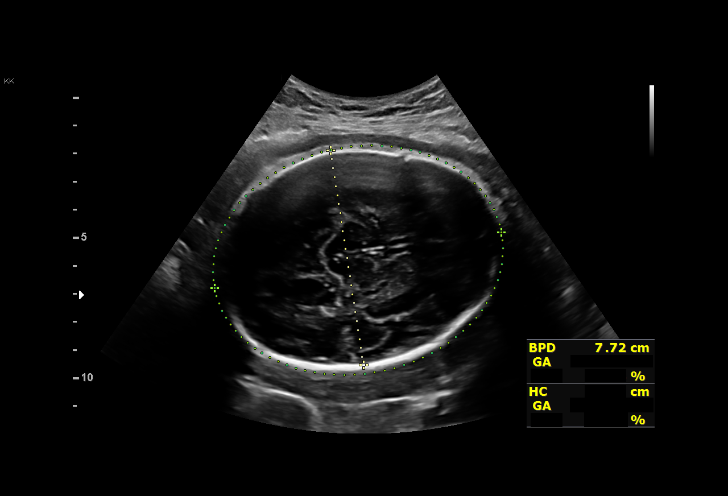
[im 32/46]
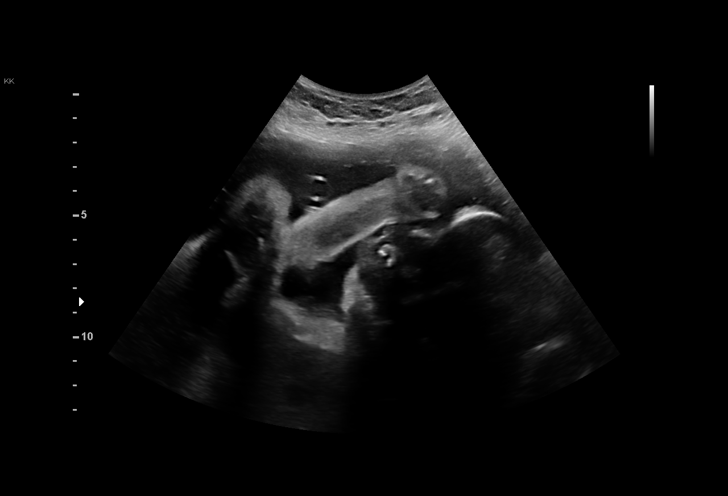
[im 36/46]
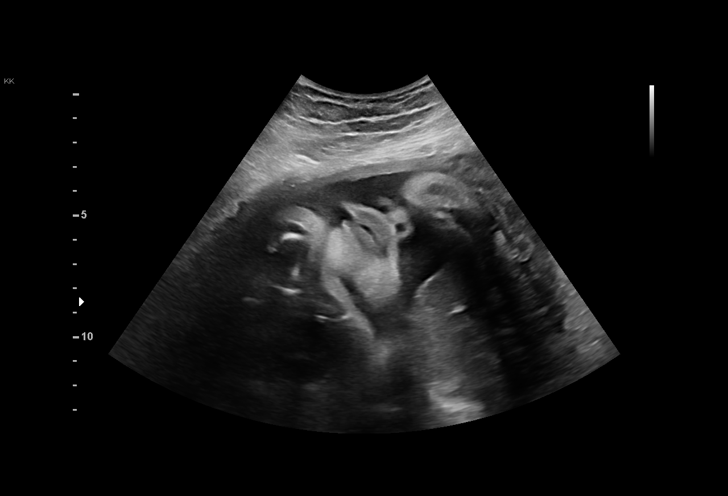
[im 39/46]
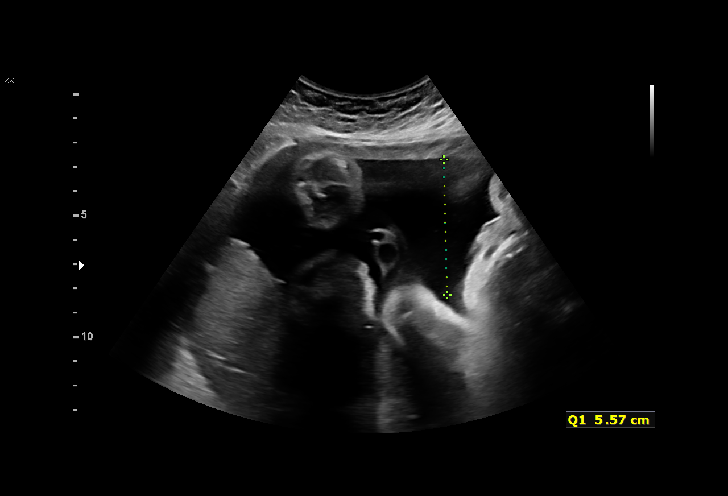
[im 42/46]
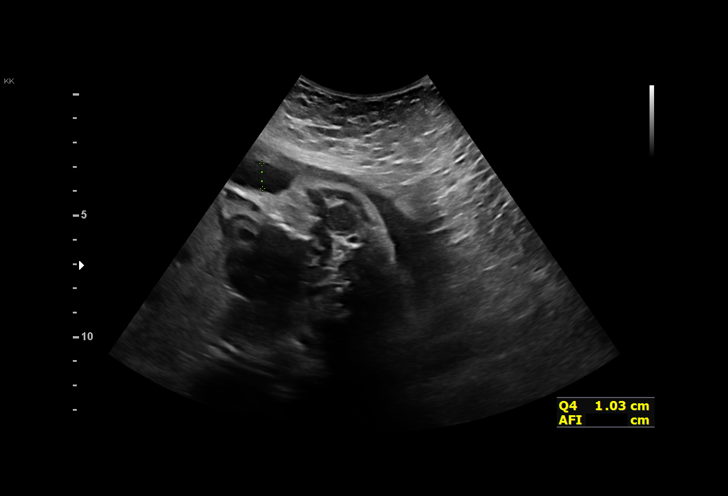
[im 46/46]
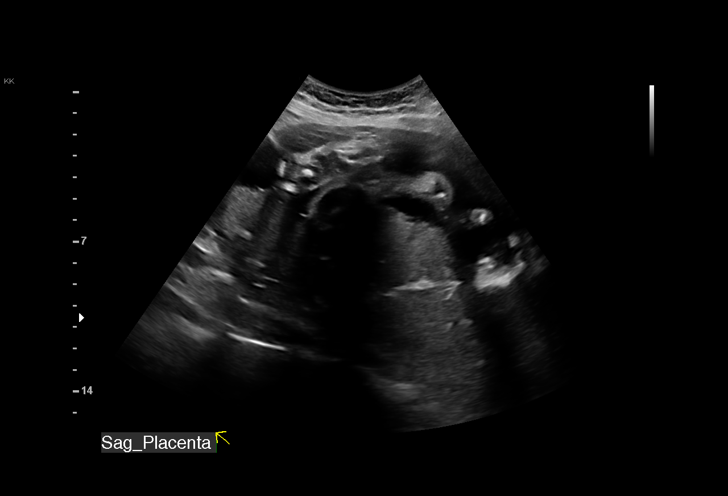

[14 of 28 positions shown; findings below may reference images not displayed]

[address]

 ----------------------------------------------------------------------

 ----------------------------------------------------------------------
Indications

  31 weeks gestation of pregnancy
  Advanced maternal age multigravida 35+,
  second trimester - low risk NIPS
  Poor obstetric history: Previous IUFD ( 31
  weeks)
  Hypertension - Chronic/Pre-existing (ASA)
  Encounter for other antenatal screening
  follow-up
 ----------------------------------------------------------------------
Fetal Evaluation

 Num Of Fetuses:          1
 Fetal Heart Rate(bpm):   168
 Cardiac Activity:        Observed
 Presentation:            Cephalic
 Placenta:                Posterior
 P. Cord Insertion:       Previously Visualized

 Amniotic Fluid
 AFI FV:      Within normal limits

 AFI Sum(cm)     %Tile       Largest Pocket(cm)
 12.56           35

 RUQ(cm)       RLQ(cm)       LUQ(cm)        LLQ(cm)

Biometry

 BPD:        77  mm     G. Age:  30w 6d         35  %    CI:        73.04   %    70 - 86
                                                         FL/HC:       19.0  %    19.3 -
 HC:      286.4  mm     G. Age:  31w 3d         26  %    HC/AC:       1.02       0.96 -
 AC:       280   mm     G. Age:  32w 0d         77  %    FL/BPD:      70.8  %    71 - 87
 FL:       54.5  mm     G. Age:  28w 6d          2  %    FL/AC:       19.5  %    20 - 24
 Est. FW:    3774   gm   3 lb 11 oz      35  %
OB History

 Gravidity:    5         Term:   3        Prem:   1        SAB:   0
 TOP:          0       Ectopic:  0        Living: 3
Gestational Age

 LMP:           31w 0d        Date:  11/24/18                 EDD:   08/31/19
 U/S Today:     30w 6d                                        EDD:   09/01/19
 Best:          31w 0d     Det. By:  LMP  (11/24/18)          EDD:   08/31/19
Anatomy

 Cranium:               Appears normal         LVOT:                   Previously seen
 Cavum:                 Previously seen        Aortic Arch:            Previously seen
 Ventricles:            Appears normal         Ductal Arch:            Previously seen
 Choroid Plexus:        Previously seen        Diaphragm:              Appears normal
 Cerebellum:            Previously seen        Stomach:                Appears normal, left
                                                                       sided
 Posterior Fossa:       Previously seen        Abdomen:                Previously seen
 Nuchal Fold:           Previously seen        Abdominal Wall:         Previously seen
 Face:                  Orbits and profile     Cord Vessels:           Previously seen
                        previously seen
 Lips:                  Previously seen        Kidneys:                Appear normal
 Palate:                Previously seen        Bladder:                Appears normal
 Thoracic:              Appears normal         Spine:                  Previously seen
 Heart:                 Previously seen        Upper Extremities:      Previously seen
 RVOT:                  Previously seen        Lower Extremities:      Previously seen

 Other:  Heels ,RIGHT 5th digit , and Nasal bone visualized previously.
Cervix Uterus Adnexa

 Cervix
 Not visualized (advanced GA >36wks)
Impression

 Amniotic fluid is normal and good fetal activity is seen. Fetal
 growth is appropriate for gestational age.
 History of fetal demise at 31 weeks.
Recommendations

 -Patient to return in 2 weeks for BPP and then weekly till
 delivery.
                 Dejean, Ubaldo

## 2021-05-09 ENCOUNTER — Ambulatory Visit (INDEPENDENT_AMBULATORY_CARE_PROVIDER_SITE_OTHER): Payer: Medicaid Other | Admitting: Primary Care

## 2021-05-15 IMAGING — US US MFM FETAL BPP W/O NON-STRESS
1 series · 14 of 28 positions shown · non-contrast
Comparison: none

[Series 1: us mfm fetal bpp w/o non-stress · 32 acquisitions, 14 frames shown]
[im 2/32]
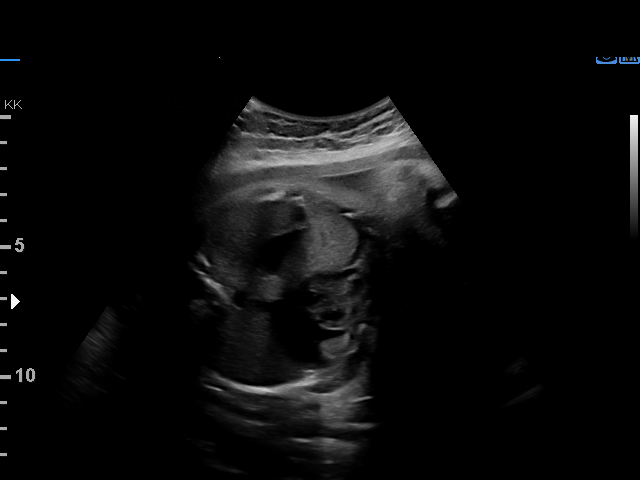
[im 4/32]
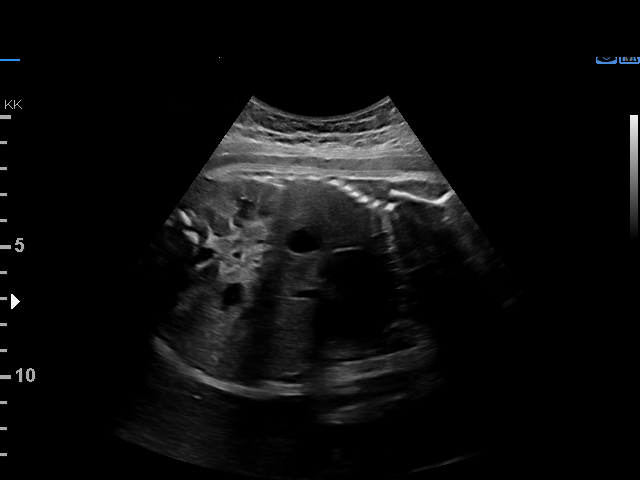
[im 6/32]
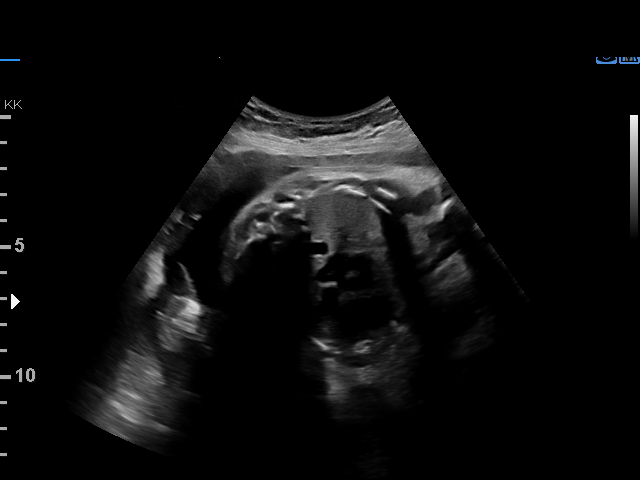
[im 9/32]
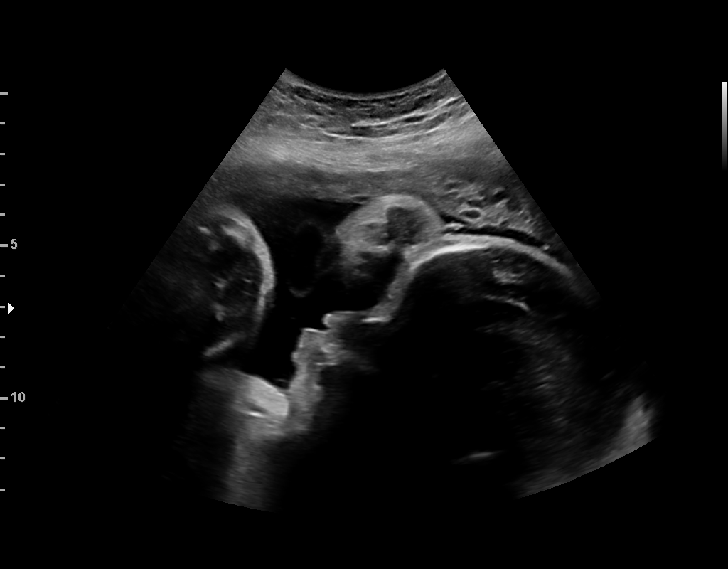
[im 11/32]
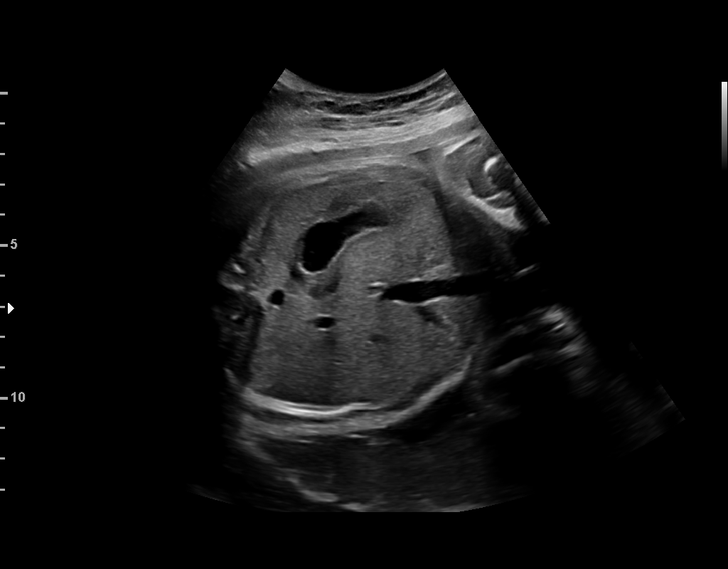
[im 13/32]
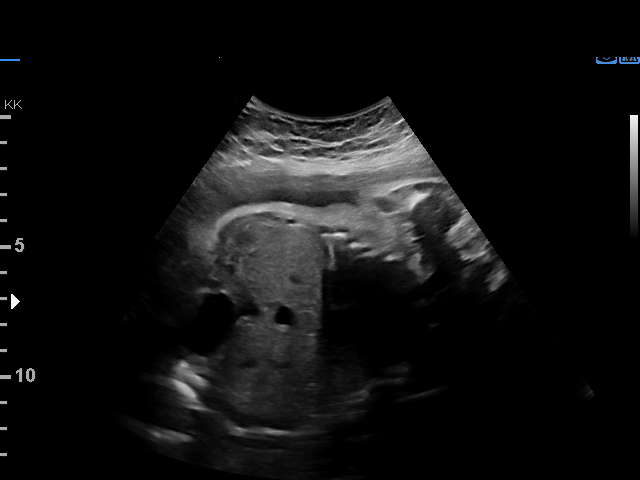
[im 15/32]
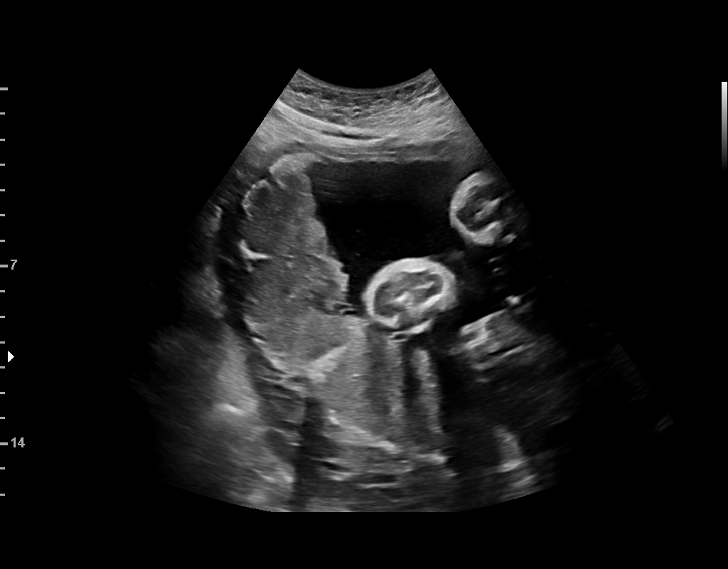
[im 18/32]
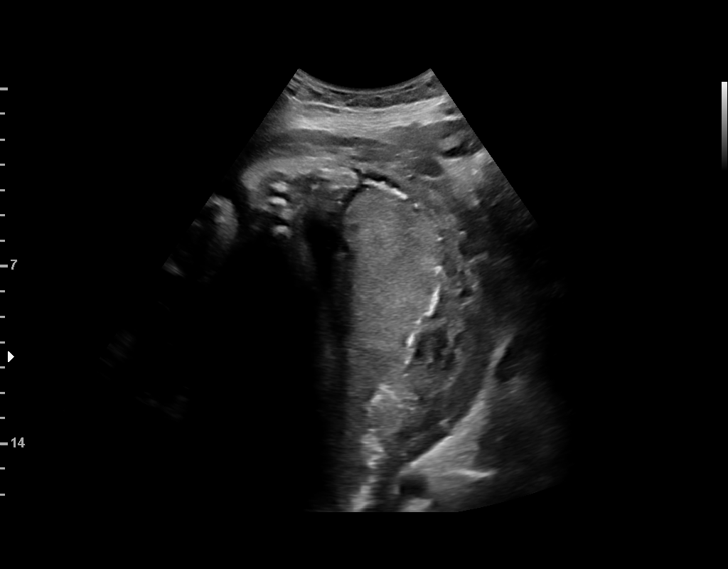
[im 20/32]
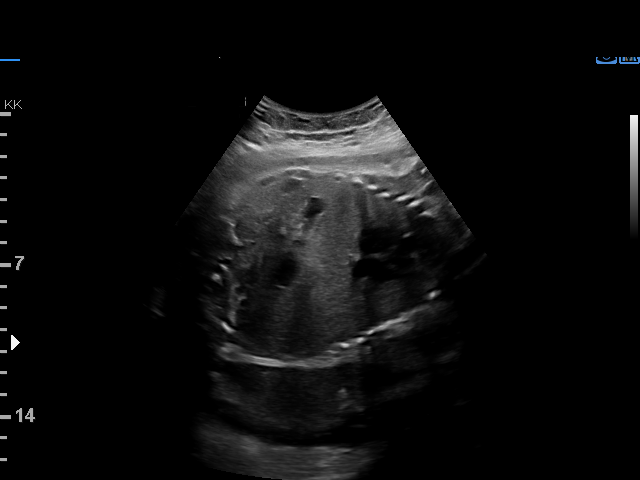
[im 22/32]
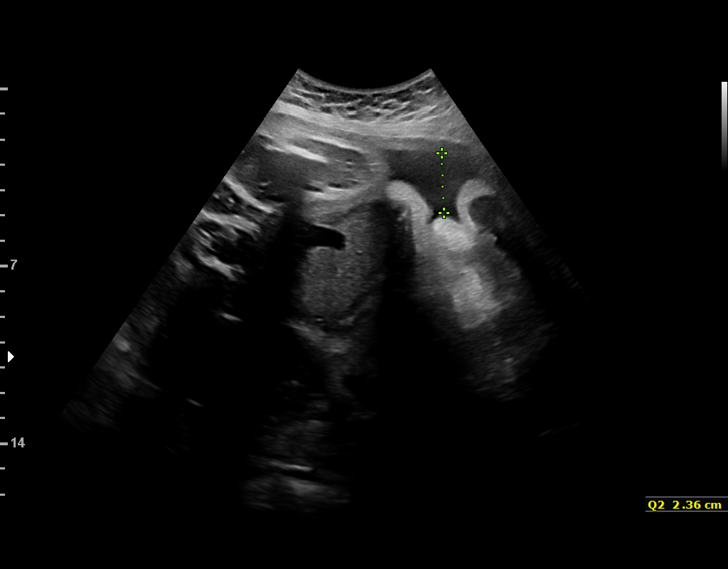
[im 25/32]
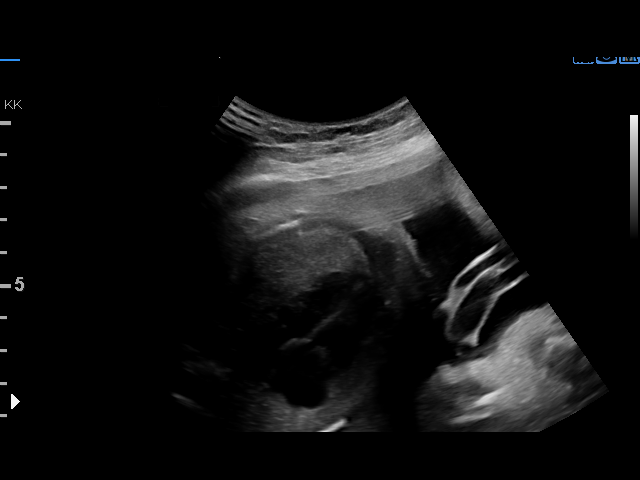
[im 27/32]
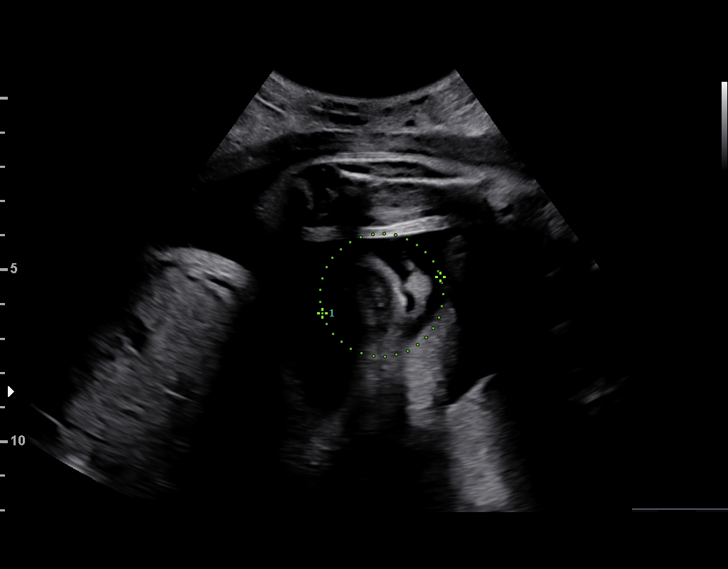
[im 29/32]
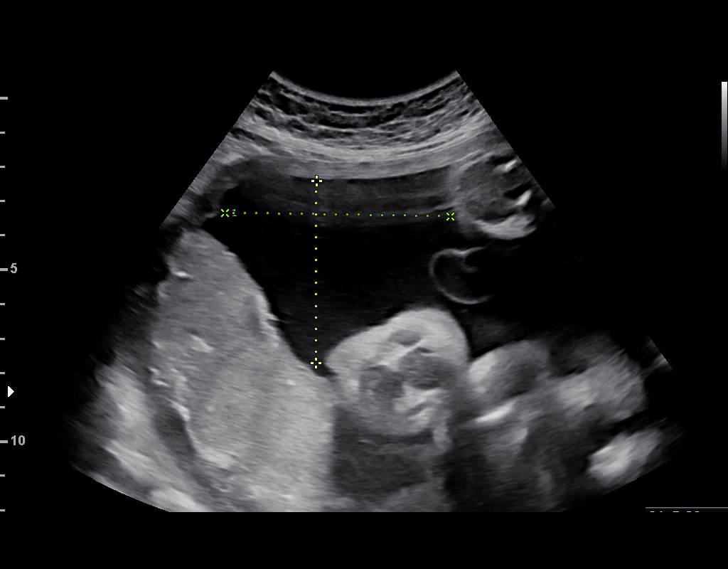
[im 32/32]
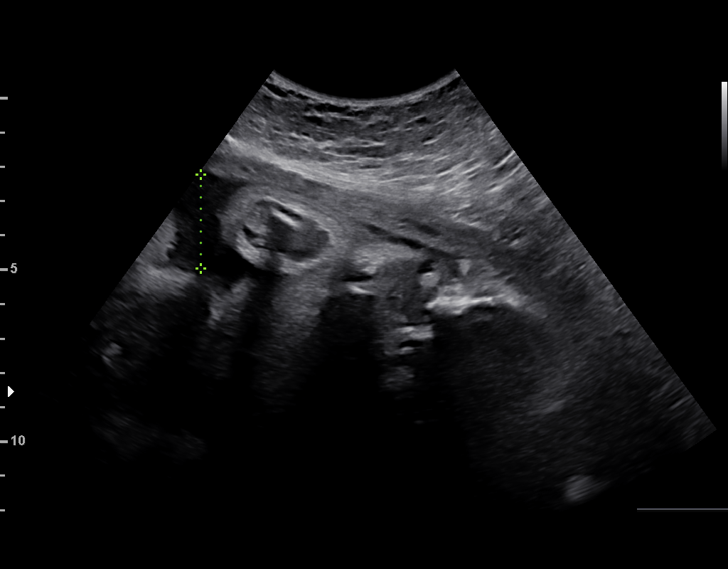

[14 of 28 positions shown; findings below may reference images not displayed]

[address]

 ----------------------------------------------------------------------

 ----------------------------------------------------------------------
Indications

  Poor obstetric history: Previous IUFD ( 31
  weeks)
  Advanced maternal age multigravida 35+,
  second trimester - low risk NIPS
  Hypertension - Chronic/Pre-existing (ASA)
  33 weeks gestation of pregnancy
 ----------------------------------------------------------------------
Fetal Evaluation

 Num Of Fetuses:         1
 Fetal Heart Rate(bpm):  137
 Cardiac Activity:       Observed
 Presentation:           Cephalic
 Placenta:               Posterior
 P. Cord Insertion:      Previously Visualized

 Amniotic Fluid
 AFI FV:      Within normal limits

 AFI Sum(cm)     %Tile       Largest Pocket(cm)
 10.69           23

 RUQ(cm)       RLQ(cm)       LUQ(cm)        LLQ(cm)

Biophysical Evaluation

 Amniotic F.V:   Pocket => 2 cm             F. Tone:        Observed
 F. Movement:    Observed                   Score:          [DATE]
 F. Breathing:   Observed
OB History
 Gravidity:    5         Term:   3        Prem:   1        SAB:   0
 TOP:          0       Ectopic:  0        Living: 3
Gestational Age

 LMP:           33w 1d        Date:  11/24/18                 EDD:   08/31/19
 Best:          33w 1d     Det. By:  LMP  (11/24/18)          EDD:   08/31/19
Comments

 A biophysical profile performed today due to a history of a
 prior IUFD was [DATE].  There was normal amniotic fluid
 noted.

## 2021-05-22 ENCOUNTER — Ambulatory Visit (INDEPENDENT_AMBULATORY_CARE_PROVIDER_SITE_OTHER): Payer: Medicaid Other | Admitting: Primary Care

## 2021-05-22 ENCOUNTER — Other Ambulatory Visit: Payer: Self-pay

## 2021-05-22 ENCOUNTER — Encounter (INDEPENDENT_AMBULATORY_CARE_PROVIDER_SITE_OTHER): Payer: Self-pay | Admitting: Primary Care

## 2021-05-22 VITALS — BP 120/82 | HR 96 | Temp 97.5°F | Ht <= 58 in | Wt 132.4 lb

## 2021-05-22 DIAGNOSIS — N920 Excessive and frequent menstruation with regular cycle: Secondary | ICD-10-CM | POA: Diagnosis not present

## 2021-05-22 DIAGNOSIS — I10 Essential (primary) hypertension: Secondary | ICD-10-CM

## 2021-05-22 NOTE — Progress Notes (Signed)
[facility]   Danielle Hoover is a 40 y.o. 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  Dietary habits include: monitoring intake with salt  Exercise habits include:yes  Family / Social history: HTN(mother)    Past Medical History:  Diagnosis Date   Anemia    during pregnancy 2006   Asthma    Headache    Hx of chlamydia infection 2016   Hypertension    Past Surgical History:  Procedure Laterality Date   NO PAST SURGERIES     No Known Allergies Current Outpatient Medications on File Prior to Visit  Medication Sig Dispense Refill   hydrochlorothiazide (HYDRODIURIL) 25 MG tablet Take 1 tablet (25 mg total) by mouth daily. 90 tablet 3   loratadine (CLARITIN) 10 MG tablet Take 1 tablet (10 mg total) by mouth daily. 30 tablet 11   NIFEdipine (PROCARDIA-XL/NIFEDICAL-XL) 30 MG 24 hr tablet Take 1 tablet (30 mg total) by mouth daily. 90 tablet 1   coconut oil OIL Apply 1 application topically as needed. 120 mL 1   Dextran 70-Hypromellose, PF, ([facility] NATURAL TEARS) 0.1-0.3 % SOLN Apply 1-2 drops to eye daily as needed. 32 each 1   diclofenac Sodium (VOLTAREN) 1 % GEL Apply 2 g topically 4 (four) times daily. 150 g 1   No current facility-administered medications on file prior to visit.   Social History   Socioeconomic History   Marital status: Divorced    Spouse name: Not on file   Number of children: Not on file   Years of education: Not on file   Highest education level: Not on file  Occupational History   Not on file  Tobacco Use   Smoking status: Never   Smokeless tobacco: Never  Substance and Sexual Activity   Alcohol use: No   Drug use: No   Sexual activity: Yes    Birth control/protection: None    Comment: last week  Other  Topics Concern   Not on file  Social History Narrative   Makes socks   Not married- has a boy friend   Four children. Recent pregnancy    Social Determinants of Corporate investment banker Strain: Not on file  Food Insecurity: Not on file  Transportation Needs: Not on file  Physical Activity: Not on file  Stress: Not on file  Social Connections: Not on file  Intimate Partner Violence: Not on file   Family History  Problem Relation Age of Onset   Alcohol abuse Neg Hx    Arthritis Neg Hx    Asthma Neg Hx    Birth defects Neg Hx    Cancer Neg Hx    COPD Neg Hx    Depression Neg Hx    Diabetes Neg Hx    Drug abuse Neg Hx    Early death Neg Hx    Hearing loss Neg Hx    Heart disease Neg Hx    Hyperlipidemia Neg Hx    Hypertension Neg Hx    Kidney disease Neg Hx    Learning disabilities Neg Hx    Mental illness Neg Hx    Mental retardation Neg Hx    Miscarriages / Stillbirths Neg Hx    Stroke Neg Hx    Vision loss Neg Hx    Varicose Veins Neg Hx  OBJECTIVE:  Vitals:   05/22/21 1531  BP: 120/82  Pulse: 96  Temp: (!) 97.5 F (36.4 C)  TempSrc: Temporal  SpO2: 100%  Weight: 132 lb 6.4 oz (60.1 kg)  Height: 4\' 9"  (1.448 m)    Physical Exam  General: Vital signs reviewed.  Patient is well-developed and well-nourished, female in no acute distress and cooperative with exam.  Head: Normocephalic and atraumatic. Eyes: EOMI, conjunctivae normal, no scleral icterus.  Neck: Supple, trachea midline, normal ROM, no JVD, masses, thyromegaly, or carotid bruit present.  Cardiovascular: RRR, S1 normal, S2 normal, no murmurs, gallops, or rubs. Pulmonary/Chest: Clear to auscultation bilaterally, no wheezes, rales, or rhonchi. Abdominal: Soft, non-tender, non-distended, BS +, no masses Musculoskeletal: No joint deformities, erythema, or stiffness, ROM full and nontender. Extremities: No lower extremity edema bilaterally,  pulses symmetric and intact bilaterally. No  cyanosis or clubbing. Neurological: A&O x3, Strength is normal and symmetric bilaterally,  Skin: Warm, dry and intact. No rashes or erythema. Psychiatric: Normal mood and affect. speech and behavior is normal. Cognition and memory are normal.    Review of Systems  All other systems reviewed and are negative.  Last 3 Office BP readings: BP Readings from Last 3 Encounters:  05/22/21 120/82  04/20/21 (!) 145/90  04/04/21 (!) 148/83    BMET    Component Value Date/Time   NA 137 02/02/2019 1604   K 3.8 02/02/2019 1604   CL 100 02/02/2019 1604   CO2 21 02/02/2019 1604   GLUCOSE 101 (H) 02/02/2019 1604   GLUCOSE 80 06/22/2015 1051   BUN 9 02/02/2019 1604   CREATININE 0.50 (L) 02/02/2019 1604   CALCIUM 9.4 02/02/2019 1604   GFRNONAA 125 02/02/2019 1604   GFRAA 144 02/02/2019 1604    Renal function: CrCl cannot be calculated (Patient's most recent lab result is older than the maximum 21 days allowed.).  Clinical ASCVD: No  The ASCVD Risk score 02/04/2019 DC Jr., et al., 2013) failed to calculate for the following reasons:   The 2013 ASCVD risk score is only valid for ages 91 to 40  ASCVD risk factors include- 76   ASSESSMENT & PLAN: Danielle Hoover was seen today for blood pressure check.  Diagnoses and all orders for this visit:  Essential hypertension  CMP -Counseled on lifestyle modifications for blood pressure control including reduced dietary sodium, increased exercise, weight reduction and adequate sleep. Also, educated patient about the risk for cardiovascular events, stroke and heart attack. Also counseled patient about the importance of medication adherence. If you participate in smoking, it is important to stop using tobacco as this will increase the risks associated with uncontrolled blood pressure.   -Hypertension longstanding diagnosed currently HCTZ 25mg  and Procardia 30mg  XL both daily on current medications. Patient is adherent with current medications.   Goal BP:  For  patients younger than 60: Goal BP < 130/80. For patients 60 and older: Goal BP < 140/90. For patients with diabetes: Goal BP < 130/80. Your most recent BP: 120/82  Minimize salt intake. Minimize alcohol intake  Short menstrual cycle CBC  This note has been created with [place]. Any transcriptional errors are unintentional.   , NP 05/22/2021, 3:40 PM

## 2021-05-23 LAB — CBC WITH DIFFERENTIAL/PLATELET
Basophils Absolute: 0.1 10*3/uL (ref 0.0–0.2)
Basos: 1 %
EOS (ABSOLUTE): 0.1 10*3/uL (ref 0.0–0.4)
Eos: 2 %
Hematocrit: 40.8 % (ref 34.0–46.6)
Hemoglobin: 13.6 g/dL (ref 11.1–15.9)
Immature Grans (Abs): 0 10*3/uL (ref 0.0–0.1)
Immature Granulocytes: 0 %
Lymphocytes Absolute: 2.6 10*3/uL (ref 0.7–3.1)
Lymphs: 29 %
MCH: 26.7 pg (ref 26.6–33.0)
MCHC: 33.3 g/dL (ref 31.5–35.7)
MCV: 80 fL (ref 79–97)
Monocytes Absolute: 0.5 10*3/uL (ref 0.1–0.9)
Monocytes: 5 %
Neutrophils Absolute: 5.7 10*3/uL (ref 1.4–7.0)
Neutrophils: 63 %
Platelets: 253 10*3/uL (ref 150–450)
RBC: 5.1 x10E6/uL (ref 3.77–5.28)
RDW: 12.8 % (ref 11.7–15.4)
WBC: 8.9 10*3/uL (ref 3.4–10.8)

## 2021-05-23 LAB — CMP14+EGFR
ALT: 39 IU/L — ABNORMAL HIGH (ref 0–32)
AST: 18 IU/L (ref 0–40)
Albumin/Globulin Ratio: 1.6 (ref 1.2–2.2)
Albumin: 4.3 g/dL (ref 3.8–4.8)
Alkaline Phosphatase: 76 IU/L (ref 44–121)
BUN/Creatinine Ratio: 14 (ref 9–23)
BUN: 10 mg/dL (ref 6–20)
Bilirubin Total: 0.3 mg/dL (ref 0.0–1.2)
CO2: 30 mmol/L — ABNORMAL HIGH (ref 20–29)
Calcium: 10 mg/dL (ref 8.7–10.2)
Chloride: 96 mmol/L (ref 96–106)
Creatinine, Ser: 0.72 mg/dL (ref 0.57–1.00)
Globulin, Total: 2.7 g/dL (ref 1.5–4.5)
Glucose: 111 mg/dL — ABNORMAL HIGH (ref 65–99)
Potassium: 3.8 mmol/L (ref 3.5–5.2)
Sodium: 138 mmol/L (ref 134–144)
Total Protein: 7 g/dL (ref 6.0–8.5)
eGFR: 110 mL/min/{1.73_m2} (ref >59)

## 2021-11-22 ENCOUNTER — Ambulatory Visit (INDEPENDENT_AMBULATORY_CARE_PROVIDER_SITE_OTHER): Payer: Medicaid Other | Admitting: Primary Care

## 2021-11-22 ENCOUNTER — Other Ambulatory Visit: Payer: Self-pay

## 2021-11-22 ENCOUNTER — Encounter (INDEPENDENT_AMBULATORY_CARE_PROVIDER_SITE_OTHER): Payer: Self-pay | Admitting: Primary Care

## 2021-11-22 VITALS — BP 119/81 | HR 91 | Temp 97.5°F | Ht <= 58 in | Wt 127.0 lb

## 2021-11-22 DIAGNOSIS — I10 Essential (primary) hypertension: Secondary | ICD-10-CM | POA: Diagnosis not present

## 2021-11-22 DIAGNOSIS — R1013 Epigastric pain: Secondary | ICD-10-CM | POA: Diagnosis not present

## 2021-11-22 DIAGNOSIS — Z760 Encounter for issue of repeat prescription: Secondary | ICD-10-CM

## 2021-11-22 DIAGNOSIS — Z23 Encounter for immunization: Secondary | ICD-10-CM | POA: Diagnosis not present

## 2021-11-22 DIAGNOSIS — K5900 Constipation, unspecified: Secondary | ICD-10-CM | POA: Diagnosis not present

## 2021-11-22 MED ORDER — NIFEDIPINE ER OSMOTIC RELEASE 30 MG PO TB24: 30.0000 mg | ORAL_TABLET | Freq: Every day | ORAL | 1 refills | Status: DC

## 2021-11-22 MED ORDER — SENNA-DOCUSATE SODIUM 8.6-50 MG PO TABS: 1.0000 | ORAL_TABLET | Freq: Every day | ORAL | 1 refills | Status: DC

## 2021-11-22 NOTE — Progress Notes (Signed)
Pt states she had a headache yesterday

## 2021-11-22 NOTE — Patient Instructions (Signed)
MANAGEMENT OF CHRONIC CONSTIPATION  Drink fluids in the recommended amount everyday. Recommend amount is 8 cups of water daily. Do not replace water with Gatorade or Powerade as these should only be used when you are dehydrated.  Eat lots of high fiber foods-fruits, veggies, bran and whole grain instead of white bread Be active everyday. Inactivity makes constipation worse. Add psyllium daily (Metamucil) which comes in capsules now. Start very low dose and work up to recommended dose on bottle daily. Stay away from Milk of Magnesia or any magnesium containing laxative, unless you need it to clear things out rarely. It is an addictive laxative and your gut will become dependent on it. If that is not working, I would start Miralax, which you can buy in generic 17 gms daily. It's a powder and not an "addictive laxative". Take it every day and titrate the dose up or down to get the daily Bm. We will consider the use of other pharmacological treatments should the above recommendations prove to be unsuccessful.   

## 2021-11-22 NOTE — Progress Notes (Signed)
Danielle Hoover, is a 41 y.o. female  NTZ:[id number]  QPR:[id number]  DOB - [birth year]  Chief Complaint  Patient presents with   Hypertension       Subjective:   Danielle Hoover is a 41 y.o. 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 Patient has  No chest pain, - No Nausea, No new weakness tingling or numbness, No Cough - SOB. She does have headache sometime but unable to identify causes.  She c/o abdominal pain starts in the epigastric area and radiates to the back. This pain wakes her up around 1-2 AM. (8/10)- massage.  No problems updated.  ALLERGIES: No Known Allergies  PAST MEDICAL HISTORY: Past Medical History:  Diagnosis Date   Anemia    during pregnancy 2006   Asthma    Headache    Hx of chlamydia infection 2016   Hypertension     MEDICATIONS AT HOME: Prior to Admission medications   Medication Sig Start Date End Date Taking? Authorizing Provider  loratadine (CLARITIN) 10 MG tablet Take 1 tablet (10 mg total) by mouth daily. 04/04/21  Yes Grayce Sessions, NP  NIFEdipine (PROCARDIA-XL/NIFEDICAL-XL) 30 MG 24 hr tablet Take 1 tablet (30 mg total) by mouth daily. 11/22/21   Grayce Sessions, NP    Objective:   Vitals:   11/22/21 1534  BP: 119/81  Pulse: 91  Temp: (!) 97.5 F (36.4 C)  TempSrc: Temporal  SpO2: 98%  Weight: 127 lb (57.6 kg)  Height: 4\' 9"  (1.448 m)   Exam General appearance : Awake, alert, not in any distress. Speech Clear. Not toxic looking HEENT: Atraumatic and Normocephalic, pupils equally reactive to light and accomodation Neck: Supple, no JVD. No cervical lymphadenopathy.  Chest: Good air entry bilaterally, no added sounds  CVS: S1 S2 regular, no murmurs.  Abdomen: Bowel sounds present, Non tender and not distended with no gaurding, rigidity or rebound. Extremities: B/L Lower Ext shows no edema, both legs are warm to touch Neurology: Awake alert, and oriented X 3, Non focal deficit  Skin: No Rash  Data Review Lab  Results  Component Value Date   HGBA1C 5.0 02/06/2021   HGBA1C 4.8 02/02/2019    Assessment & Plan   1. Need for immunization against influenza - Flu Vaccine QUAD 64mo+IM (Fluarix, Fluzone & Alfiuria Quad PF)  2. Essential hypertension Bp is well controlled on monotherapy Nifedipine 30mg  XR. She stop HCTZ 25mg  after she completed the bottle.  She is at goal of less than 130/80, low-sodium, DASH diet, medication compliance, 150 minutes of moderate intensity exercise per week. Discussed medication compliance, adverse effects.  - NIFEdipine (PROCARDIA-XL/NIFEDICAL-XL) 30 MG 24 hr tablet; Take 1 tablet (30 mg total) by mouth daily.  Dispense: 90 tablet; Refill: 1  3. Medication refill - NIFEdipine (PROCARDIA-XL/NIFEDICAL-XL) 30 MG 24 hr tablet; Take 1 tablet (30 mg total) by mouth daily.  Dispense: 90 tablet; Refill: 1  4. Epigastric pain Advise to take OTC Tums if no improvement schedule f/u  Constipation, unspecified constipation type Drinks 6 -8 oz  bottles of water , vegetables, grains, fruits  Patient have been counseled extensively about nutrition and exercise. Other issues discussed during this visit include: low cholesterol diet, weight control and daily exercise, foot care, annual eye examinations at Ophthalmology, importance of adherence with medications and regular follow-up. We also discussed long term complications of uncontrolled diabetes and hypertension.   Return in about 6 months (around 05/22/2022).  The patient was given  clear instructions to go to ER or return to medical center if symptoms don't improve, worsen or new problems develop. The patient verbalized understanding. The patient was told to call to get lab results if they haven't heard anything in the next week.   This note has been created with Education officer, environmental. Any transcriptional errors are unintentional.    Grayce Sessions, 11/22/2021, 4:08 PM

## 2021-12-27 ENCOUNTER — Encounter (HOSPITAL_COMMUNITY): Payer: Self-pay | Admitting: Emergency Medicine

## 2021-12-27 ENCOUNTER — Ambulatory Visit (HOSPITAL_COMMUNITY)
Admission: EM | Admit: 2021-12-27 | Discharge: 2021-12-27 | Disposition: A | Discharge status: Home / Self Care | Payer: Medicaid Other | Attending: Family Medicine | Admitting: Family Medicine

## 2021-12-27 ENCOUNTER — Other Ambulatory Visit: Payer: Self-pay

## 2021-12-27 ENCOUNTER — Ambulatory Visit (INDEPENDENT_AMBULATORY_CARE_PROVIDER_SITE_OTHER): Payer: Self-pay | Admitting: *Deleted

## 2021-12-27 DIAGNOSIS — N390 Urinary tract infection, site not specified: Secondary | ICD-10-CM

## 2021-12-27 DIAGNOSIS — R350 Frequency of micturition: Secondary | ICD-10-CM

## 2021-12-27 DIAGNOSIS — R300 Dysuria: Secondary | ICD-10-CM

## 2021-12-27 LAB — POCT URINALYSIS DIPSTICK, ED / UC
Bilirubin Urine: NEGATIVE
Glucose, UA: NEGATIVE mg/dL
Ketones, ur: NEGATIVE mg/dL
Nitrite: NEGATIVE
Protein, ur: NEGATIVE mg/dL
Specific Gravity, Urine: <=1.005 (ref 1.005–1.030)
Urobilinogen, UA: 0.2 mg/dL (ref 0.0–1.0)
pH: 6.5 (ref 5.0–8.0)

## 2021-12-27 MED ORDER — SULFAMETHOXAZOLE-TRIMETHOPRIM 800-160 MG PO TABS: 1.0000 | ORAL_TABLET | Freq: Two times a day (BID) | ORAL | 0 refills | Status: AC

## 2021-12-27 MED ORDER — PHENAZOPYRIDINE HCL 200 MG PO TABS: 200.0000 mg | ORAL_TABLET | Freq: Three times a day (TID) | ORAL | 0 refills | Status: DC

## 2021-12-27 NOTE — Discharge Instructions (Signed)
You were seen today for urinary tract infection.  I have sent out an antibiotic as well as a mediation to help with discomfort.  This may turn your urine an orange color.  I recommend you increase fluids.  Please return if your symptoms do not improve.

## 2021-12-27 NOTE — ED Triage Notes (Addendum)
Patient has a history of UTI.  This episode started Sunday, 12/23/2021.  Usually drinks a lot of water and feels better, not this time.  Reports burning with urination, urgency and frequency  Patient has been out of blood pressure medicine for one month

## 2021-12-27 NOTE — Telephone Encounter (Signed)
°  Chief Complaint: painful urinating , blood in urine Symptoms: bloody urine every urination, frequency every 15 minutes, pain after urinating and burning  Frequency: started Monday  Pertinent Negatives: Patient denies fever, unable to urinate Disposition: [] ED /[x] Urgent Care (no appt availability in office) / [] Appointment(In office/virtual)/ []  [facility]/ [] Home Care/ [] Refused Recommended Disposition /[] [facility]/ []  Follow-up with PCP Additional Notes:   Recommended to be seen today at [facility] walkin clinic. Patient reports she is at work. No available appt.Instructed patient to go to [facility] or ED for evaluation.   Reason for Disposition  Blood in urine  (Exception: could be normal menstrual bleeding)  Answer Assessment - Initial Assessment Questions 1. COLOR of URINE: "Describe the color of the urine."  (e.g., tea-colored, pink, red, blood clots, bloody)     bloody 2. ONSET: "When did the bleeding start?"      Monday  3. EPISODES: "How many times has there been blood in the urine?" or "How many times today?"     Every time urinates 4. PAIN with URINATION: "Is there any pain with passing your urine?" If Yes, ask: "How bad is the pain?"  (Scale 1-10; or mild, moderate, severe)    - MILD - complains slightly about urination hurting    - MODERATE - interferes with normal activities      - SEVERE - excruciating, unwilling or unable to urinate because of the pain      Moderate pain esp. After urinating  5. FEVER: "Do you have a fever?" If Yes, ask: "What is your temperature, how was it measured, and when did it start?"     no 6. ASSOCIATED SYMPTOMS: "Are you passing urine more frequently than usual?"     Yes every 15 minutes  7. OTHER SYMPTOMS: "Do you have any other symptoms?" (e.g., back/flank pain, abdominal pain, vomiting)     Burning urinating  8. PREGNANCY: "Is there any chance you are pregnant?" "When was your last menstrual period?"     na  Protocols used:  Urine - Blood In-A-AH

## 2021-12-27 NOTE — ED Notes (Signed)
Urine in lab 

## 2021-12-27 NOTE — ED Provider Notes (Signed)
[facility]    CSN: [id number] Arrival date & time: 12/27/21  1112      History   Chief Complaint Chief Complaint  Patient presents with   Urinary Tract Infection    HPI Hani Maready is a 41 y.o. female.   Patient is here for possible uti.  Started with symptoms about 4 days ago with dysuria,  She does have a history of UTI's.  No fevers/chills.  No back pain.   Past Medical History:  Diagnosis Date   Anemia    during pregnancy 2006   Asthma    Headache    Hx of chlamydia infection 2016   Hypertension     Patient Active Problem List   Diagnosis Date Noted   Postpartum care and examination 09/16/2019   Prior poor obstetrical history, antepartum 08/10/2019   Encounter for induction of labor 08/10/2019   History of poor fetal growth 05/12/2019   Lewis isoimmunization during pregnancy 02/11/2019   GBS bacteriuria 02/07/2019   Supervision of high risk pregnancy, antepartum 02/02/2019   History of IUFD 02/02/2019   Advanced maternal age in multigravida 02/02/2019   Chronic hypertension complicating pregnancy, antepartum 02/02/2019   Language barrier 10/10/2014    Past Surgical History:  Procedure Laterality Date   NO PAST SURGERIES      OB History     Gravida  5   Para  5   Term  4   Preterm  1   AB  0   Living  4      SAB  0   IAB  0   Ectopic  0   Multiple  0   Live Births  4            Home Medications    Prior to Admission medications   Medication Sig Start Date End Date Taking? Authorizing Provider  loratadine (CLARITIN) 10 MG tablet Take 1 tablet (10 mg total) by mouth daily. 04/04/21   Grayce Sessions, NP  NIFEdipine (PROCARDIA-XL/NIFEDICAL-XL) 30 MG 24 hr tablet Take 1 tablet (30 mg total) by mouth daily. Patient not taking: Reported on 12/27/2021 11/22/21   Grayce Sessions, NP  sennosides-docusate sodium (SENOKOT-S) 8.6-50 MG tablet Take 1 tablet by mouth daily. 11/22/21   Grayce Sessions, NP    Family  History Family History  Problem Relation Age of Onset   Hypertension Mother    Alcohol abuse Neg Hx    Arthritis Neg Hx    Asthma Neg Hx    Birth defects Neg Hx    Cancer Neg Hx    COPD Neg Hx    Depression Neg Hx    Diabetes Neg Hx    Drug abuse Neg Hx    Early death Neg Hx    Hearing loss Neg Hx    Heart disease Neg Hx    Hyperlipidemia Neg Hx    Kidney disease Neg Hx    Learning disabilities Neg Hx    Mental illness Neg Hx    Mental retardation Neg Hx    Miscarriages / Stillbirths Neg Hx    Stroke Neg Hx    Vision loss Neg Hx    Varicose Veins Neg Hx     Social History Social History   Tobacco Use   Smoking status: Never   Smokeless tobacco: Never  Vaping Use   Vaping Use: Never used  Substance Use Topics   Alcohol use: No   Drug use: No  Allergies   Patient has no known allergies.   Review of Systems Review of Systems  Constitutional: Negative.   HENT: Negative.    Respiratory: Negative.    Cardiovascular: Negative.   Gastrointestinal: Negative.   Genitourinary:  Positive for dysuria.    Physical Exam Triage Vital Signs ED Triage Vitals  Enc Vitals Group     BP 12/27/21 1238 135/80     Pulse Rate 12/27/21 1238 97     Resp 12/27/21 1238 18     Temp 12/27/21 1238 98.7 F (37.1 C)     Temp Source 12/27/21 1238 Oral     SpO2 12/27/21 1238 98 %     Weight --      Height --      Head Circumference --      Peak Flow --      Pain Score 12/27/21 1234 4     Pain Loc --      Pain Edu? --      Excl. in GC? --    No data found.  Updated Vital Signs BP 135/80 (BP Location: Left Arm) Comment: without medicine   Pulse 97    Temp 98.7 F (37.1 C) (Oral)    Resp 18    SpO2 98%   Visual Acuity Right Eye Distance:   Left Eye Distance:   Bilateral Distance:    Right Eye Near:   Left Eye Near:    Bilateral Near:     Physical Exam Constitutional:      Appearance: Normal appearance.  Cardiovascular:     Rate and Rhythm: Normal rate and  regular rhythm.  Pulmonary:     Effort: Pulmonary effort is normal.     Breath sounds: Normal breath sounds.  Abdominal:     Palpations: Abdomen is soft.     Tenderness: There is abdominal tenderness in the suprapubic area.     UC Treatments / Results  Labs (all labs ordered are listed, but only abnormal results are displayed) Labs Reviewed  POCT URINALYSIS DIPSTICK, ED / UC - Abnormal; Notable for the following components:      Result Value   Hgb urine dipstick LARGE (*)    Leukocytes,Ua MODERATE (*)    All other components within normal limits    EKG   Radiology No results found.  Procedures Procedures (including critical care time)  Medications Ordered in UC Medications - No data to display  Initial Impression / Assessment and Plan / UC Course  I have reviewed the triage vital signs and the nursing notes.  Pertinent labs & imaging results that were available during my care of the patient were reviewed by me and considered in my medical decision making (see chart for details).   Patient was seen today for uti symptoms.  Urine consistent.  Will treat with bactrim and pyridium.  Advised to return if symptoms do not improve.   Final Clinical Impressions(s) / UC Diagnoses   Final diagnoses:  Dysuria  Urinary frequency  Lower urinary tract infectious disease     Discharge Instructions      You were seen today for urinary tract infection.  I have sent out an antibiotic as well as a mediation to help with discomfort.  This may turn your urine an orange color.  I recommend you increase fluids.  Please return if your symptoms do not improve.     ED Prescriptions     Medication Sig Dispense Auth. Provider   sulfamethoxazole-trimethoprim (BACTRIM DS) 800-160  MG tablet Take 1 tablet by mouth 2 (two) times daily for 7 days. 14 tablet Rielly Brunn, MD   phenazopyridine (PYRIDIUM) 200 MG tablet Take 1 tablet (200 mg total) by mouth 3 (three) times daily. 6 tablet  Jannifer Franklin, MD      PDMP not reviewed this encounter.   Jannifer Franklin, MD 12/27/21 1306

## 2021-12-27 NOTE — Telephone Encounter (Signed)
FYI

## 2021-12-27 NOTE — ED Notes (Signed)
Patient needed to go to bathroom prior to intake

## 2022-05-22 ENCOUNTER — Ambulatory Visit (INDEPENDENT_AMBULATORY_CARE_PROVIDER_SITE_OTHER): Payer: Medicaid Other | Admitting: Primary Care

## 2022-05-22 ENCOUNTER — Encounter (INDEPENDENT_AMBULATORY_CARE_PROVIDER_SITE_OTHER): Payer: Self-pay | Admitting: Primary Care

## 2022-05-22 VITALS — BP 141/91 | HR 82 | Temp 97.9°F | Wt 135.0 lb

## 2022-05-22 DIAGNOSIS — K5900 Constipation, unspecified: Secondary | ICD-10-CM | POA: Diagnosis not present

## 2022-05-22 DIAGNOSIS — I10 Essential (primary) hypertension: Secondary | ICD-10-CM | POA: Diagnosis not present

## 2022-05-22 DIAGNOSIS — Z760 Encounter for issue of repeat prescription: Secondary | ICD-10-CM | POA: Diagnosis not present

## 2022-05-22 MED ORDER — SENNA-DOCUSATE SODIUM 8.6-50 MG PO TABS: 1.0000 | ORAL_TABLET | Freq: Every day | ORAL | 1 refills | Status: DC

## 2022-05-22 MED ORDER — NIFEDIPINE ER OSMOTIC RELEASE 30 MG PO TB24: 30.0000 mg | ORAL_TABLET | Freq: Every day | ORAL | 1 refills | Status: DC

## 2022-05-22 NOTE — Progress Notes (Signed)
[facility]  Danielle Hoover, is a 41 y.o. female  ERX:[id number]  [id number]:[id number]  DOB - [birth year]  No chief complaint on file.     Y Hin Jari  Subjective:   Danielle Hoover is a 41 y.o. female here today for complaints and concerns of constipation and the management of Hypertension. Patient has No headache, No chest pain, No abdominal pain - No Nausea, No new weakness tingling or numbness, No Cough - shortness of breath.  Blood pressure is elevated today actually higher than the previous visit although she states she takes her medications daily.  No problems updated.  No Known Allergies  Past Medical History:  Diagnosis Date   Anemia    during pregnancy 2006   Asthma    Headache    Hx of chlamydia infection 2016   Hypertension     Current Outpatient Medications on File Prior to Visit  Medication Sig Dispense Refill   loratadine (CLARITIN) 10 MG tablet Take 1 tablet (10 mg total) by mouth daily. 30 tablet 11   phenazopyridine (PYRIDIUM) 200 MG tablet Take 1 tablet (200 mg total) by mouth 3 (three) times daily. 6 tablet 0   sennosides-docusate sodium (SENOKOT-S) 8.6-50 MG tablet Take 1 tablet by mouth daily. 90 tablet 1   NIFEdipine (PROCARDIA-XL/NIFEDICAL-XL) 30 MG 24 hr tablet Take 1 tablet (30 mg total) by mouth daily. (Patient not taking: Reported on 12/27/2021) 90 tablet 1   No current facility-administered medications on file prior to visit.    Objective:   Vitals:   05/22/22 1552  BP: (!) 141/91  Pulse: 82  Temp: 97.9 F (36.6 C)  TempSrc: Oral  SpO2: 98%  Weight: 135 lb (61.2 kg)    Exam General appearance : Awake, alert, not in any distress. Speech Clear. Not toxic looking HEENT: Atraumatic and Normocephalic, pupils equally reactive to light and accomodation Neck: Supple, no JVD. No cervical lymphadenopathy.  Chest: Good air entry bilaterally, no added sounds  CVS: S1 S2 regular, no murmurs.  Abdomen: Bowel sounds present, Non tender and not  distended with no gaurding, rigidity or rebound. Extremities: B/L Lower Ext shows no edema, both legs are warm to touch Neurology: Awake alert, and oriented X 3, CN II-XII intact, Non focal Skin: No Rash  Data Review Lab Results  Component Value Date   HGBA1C 5.0 02/06/2021   HGBA1C 4.8 02/02/2019    Assessment & Plan  Diagnoses and all orders for this visit:  Constipation, unspecified constipation type MANAGEMENT OF CHRONIC CONSTIPATION Drink fluids in the recommended amount everyday. Recommend amount is 8 cups of water daily. Do not replace water with Gatorade or Powerade as these should only be used when you are dehydrated.  Eat lots of high fiber foods-fruits, veggies, bran and whole grain instead of white bread Be active everyday. Inactivity makes constipation worse. Add psyllium daily (Metamucil) which comes in capsules now. Start very low dose and work up to recommended dose on bottle daily. Stay away from Milk of Magnesia or any magnesium containing laxative, unless you need it to clear things out rarely. It is an addictive laxative and your gut will become dependent on it. If that is not working, I would start Miralax, which you can buy in generic 17 gms daily. It's a powder and not an "addictive laxative". Take it every day and titrate the dose up or down to get the daily Bm. We will consider the use of other pharmacological treatments should the above recommendations prove to be  unsuccessful.   -     sennosides-docusate sodium (SENOKOT-S) 8.6-50 MG tablet; Take 1 tablet by mouth daily.  Essential hypertension Counseled on blood pressure goal of less than 130/80, low-sodium, DASH diet, medication compliance, 150 minutes of moderate intensity exercise per week. Discussed medication compliance, adverse effects.  -     NIFEdipine (PROCARDIA-XL/NIFEDICAL-XL) 30 MG 24 hr tablet; Take 1 tablet (30 mg total) by mouth daily.  Medication refill -     NIFEdipine  (PROCARDIA-XL/NIFEDICAL-XL) 30 MG 24 hr tablet; Take 1 tablet (30 mg total) by mouth daily.    Patient have been counseled extensively about nutrition and exercise. Other issues discussed during this visit include: low cholesterol diet, weight control and daily exercise, foot care, annual eye examinations at Ophthalmology, importance of adherence with medications and regular follow-up. We also discussed long term complications of uncontrolled diabetes and hypertension.   Return in about 6 weeks (around 07/03/2022) for BP/labs.  The patient was given clear instructions to go to ER or return to medical center if symptoms don't improve, worsen or new problems develop. The patient verbalized understanding. The patient was told to call to get lab results if they haven't heard anything in the next week.   This note has been created with Education officer, environmental. Any transcriptional errors are unintentional.   Grayce Sessions, NP 05/22/2022, 4:10 PM

## 2022-07-04 ENCOUNTER — Encounter (INDEPENDENT_AMBULATORY_CARE_PROVIDER_SITE_OTHER): Payer: Self-pay | Admitting: Primary Care

## 2022-07-04 ENCOUNTER — Ambulatory Visit (INDEPENDENT_AMBULATORY_CARE_PROVIDER_SITE_OTHER): Payer: Medicaid Other | Admitting: Primary Care

## 2022-07-04 VITALS — BP 132/88 | HR 98 | Temp 98.1°F | Ht <= 58 in | Wt 134.8 lb

## 2022-07-04 DIAGNOSIS — Z23 Encounter for immunization: Secondary | ICD-10-CM

## 2022-07-04 DIAGNOSIS — I10 Essential (primary) hypertension: Secondary | ICD-10-CM

## 2022-07-04 DIAGNOSIS — R300 Dysuria: Secondary | ICD-10-CM

## 2022-07-04 DIAGNOSIS — Z79899 Other long term (current) drug therapy: Secondary | ICD-10-CM

## 2022-07-04 LAB — POCT URINALYSIS DIP (CLINITEK)
Bilirubin, UA: NEGATIVE
Glucose, UA: NEGATIVE mg/dL
Ketones, POC UA: NEGATIVE mg/dL
Leukocytes, UA: NEGATIVE
Nitrite, UA: NEGATIVE
POC PROTEIN,UA: 30 — AB
Spec Grav, UA: 1.01 (ref 1.010–1.025)
Urobilinogen, UA: 0.2 E.U./dL
pH, UA: 7 (ref 5.0–8.0)

## 2022-07-04 NOTE — Progress Notes (Signed)
Danielle Hoover is a 41 y.o. 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 At the end of the visit she mention burning with urination.  Patient reports adherence with medications.  Dietary habits include: she is aware of sodium and eats her cultures dishes  Exercise habits include:walking  Family / Social history: None   Past Medical History:  Diagnosis Date   Anemia    during pregnancy 2006   Asthma    Headache    Hx of chlamydia infection 2016   Hypertension    Past Surgical History:  Procedure Laterality Date   NO PAST SURGERIES     No Known Allergies Current Outpatient Medications on File Prior to Visit  Medication Sig Dispense Refill   loratadine (CLARITIN) 10 MG tablet Take 1 tablet (10 mg total) by mouth daily. 30 tablet 11   NIFEdipine (PROCARDIA-XL/NIFEDICAL-XL) 30 MG 24 hr tablet Take 1 tablet (30 mg total) by mouth daily. 90 tablet 1   phenazopyridine (PYRIDIUM) 200 MG tablet Take 1 tablet (200 mg total) by mouth 3 (three) times daily. 6 tablet 0   sennosides-docusate sodium (SENOKOT-S) 8.6-50 MG tablet Take 1 tablet by mouth daily. 90 tablet 1   No current facility-administered medications on file prior to visit.   Social History   Socioeconomic History   Marital status: Divorced    Spouse name: Not on file   Number of children: Not on file   Years of education: Not on file   Highest education level: Not on file  Occupational History   Not on file  Tobacco Use   Smoking status: Never   Smokeless tobacco: Never  Vaping Use   Vaping Use: Never used  Substance and Sexual Activity   Alcohol use: No   Drug use: No   Sexual activity: Yes    Birth control/protection: None    Comment: last week  Other Topics Concern   Not on file  Social History  Narrative   Makes socks   Not married- has a boy friend   Four children. Recent pregnancy    Social Determinants of Radio broadcast assistant Strain: Not on file  Food Insecurity: Not on file  Transportation Needs: Not on file  Physical Activity: Not on file  Stress: Not on file  Social Connections: Not on file  Intimate Partner Violence: Not on file   Family History  Problem Relation Age of Onset   Hypertension Mother    Alcohol abuse Neg Hx    Arthritis Neg Hx    Asthma Neg Hx    Birth defects Neg Hx    Cancer Neg Hx    COPD Neg Hx    Depression Neg Hx    Diabetes Neg Hx    Drug abuse Neg Hx    Early death Neg Hx    Hearing loss Neg Hx    Heart disease Neg Hx    Hyperlipidemia Neg Hx    Kidney disease Neg Hx    Learning disabilities Neg Hx    Mental illness Neg Hx    Mental retardation Neg Hx    Miscarriages / Stillbirths Neg Hx    Stroke Neg Hx    Vision loss Neg Hx    Varicose Veins Neg Hx      OBJECTIVE:  BP 132/88   Pulse 98   Temp  98.1 F (36.7 C) (Oral)   Ht 4' 9" (1.448 m)   Wt 134 lb 12.8 oz (61.1 kg)   SpO2 99%   BMI 29.17 kg/m   Physical Exam Vitals reviewed.  Constitutional:      Appearance: She is obese.  HENT:     Head: Normocephalic.     Right Ear: External ear normal.     Left Ear: External ear normal.     Nose: Nose normal.  Cardiovascular:     Rate and Rhythm: Normal rate and regular rhythm.  Pulmonary:     Effort: Pulmonary effort is normal.     Breath sounds: Normal breath sounds.  Abdominal:     General: Bowel sounds are normal. There is distension.     Palpations: Abdomen is soft.  Musculoskeletal:     Cervical back: Normal range of motion.  Skin:    General: Skin is warm and dry.  Neurological:     Mental Status: She is alert and oriented to person, place, and time.  Psychiatric:        Mood and Affect: Mood normal.        Behavior: Behavior normal.    ROS Comprehensive ROS Pertinent positive and negative  noted in HPI   Last 3 Office BP readings: BP Readings from Last 3 Encounters:  05/22/22 (!) 141/91  12/27/21 135/80  11/22/21 119/81    BMET    Component Value Date/Time   NA 138 05/22/2021 1601   K 3.8 05/22/2021 1601   CL 96 05/22/2021 1601   CO2 30 (H) 05/22/2021 1601   GLUCOSE 111 (H) 05/22/2021 1601   GLUCOSE 80 06/22/2015 1051   BUN 10 05/22/2021 1601   CREATININE 0.72 05/22/2021 1601   CALCIUM 10.0 05/22/2021 1601   GFRNONAA 125 02/02/2019 1604   GFRAA 144 02/02/2019 1604    Renal function: CrCl cannot be calculated (Patient's most recent lab result is older than the maximum 21 days allowed.).  Clinical ASCVD: No  The ASCVD Risk score (Arnett DK, et al., 2019) failed to calculate for the following reasons:   The 2019 ASCVD risk score is only valid for ages 2 to 64  ASCVD risk factors include- [place]   ASSESSMENT & PLAN: Danielle Hoover was seen today for blood pressure check.  Diagnoses and all orders for this visit:  Medication management -     CBC with Differential/Platelet -     CMP14+EGFR  Need for immunization against influenza -     Flu Vaccine QUAD 19moIM (Fluarix, Fluzone & Alfiuria Quad PF)  Burning with urination -     POCT URINALYSIS DIP (CLINITEK) Results were negative encouraged to increase water reduce soda intake, drink cranberry juice and eat probiotic yogurt   Essential hypertension -Counseled on lifestyle modifications for blood pressure control including reduced dietary sodium, increased exercise, weight reduction and adequate sleep. Also, educated patient about the risk for cardiovascular events, stroke and heart attack. Also counseled patient about the importance of medication adherence. If you participate in smoking, it is important to stop using tobacco as this will increase the risks associated with uncontrolled blood pressure.   Goal BP:  For patients younger than 60: Goal BP < 130/80. For patients 60 and older: Goal BP < 140/90. For patients  with diabetes: Goal BP < 130/80. Your most recent BP: 132/88  Minimize salt intake. Minimize alcohol intake    This note has been created with DSurveyor, quantity Any transcriptional  errors are unintentional.   Kerin Perna, NP 07/04/2022, 4:06 PM

## 2023-01-03 ENCOUNTER — Ambulatory Visit (INDEPENDENT_AMBULATORY_CARE_PROVIDER_SITE_OTHER): Payer: Medicaid Other | Admitting: Primary Care

## 2023-01-03 ENCOUNTER — Encounter (INDEPENDENT_AMBULATORY_CARE_PROVIDER_SITE_OTHER): Payer: Self-pay | Admitting: Primary Care

## 2023-01-03 VITALS — BP 132/88 | HR 69 | Resp 16 | Ht <= 58 in | Wt 130.8 lb

## 2023-01-03 DIAGNOSIS — Z1159 Encounter for screening for other viral diseases: Secondary | ICD-10-CM

## 2023-01-03 DIAGNOSIS — I10 Essential (primary) hypertension: Secondary | ICD-10-CM

## 2023-01-03 DIAGNOSIS — Z760 Encounter for issue of repeat prescription: Secondary | ICD-10-CM | POA: Diagnosis not present

## 2023-01-03 DIAGNOSIS — Z789 Other specified health status: Secondary | ICD-10-CM | POA: Diagnosis not present

## 2023-01-03 DIAGNOSIS — Z79899 Other long term (current) drug therapy: Secondary | ICD-10-CM

## 2023-01-03 DIAGNOSIS — E663 Overweight: Secondary | ICD-10-CM

## 2023-01-03 DIAGNOSIS — Z131 Encounter for screening for diabetes mellitus: Secondary | ICD-10-CM

## 2023-01-03 DIAGNOSIS — Z3041 Encounter for surveillance of contraceptive pills: Secondary | ICD-10-CM

## 2023-01-03 DIAGNOSIS — Z13220 Encounter for screening for lipoid disorders: Secondary | ICD-10-CM

## 2023-01-03 MED ORDER — NIFEDIPINE ER OSMOTIC RELEASE 30 MG PO TB24: 30.0000 mg | ORAL_TABLET | Freq: Every day | ORAL | 1 refills | Status: DC

## 2023-01-03 NOTE — Progress Notes (Signed)
[place], is a 42 y.o. female  [id number]  [id number]  DOB - 07-06-1981  No chief complaint on file.      Subjective:   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 Patient has No headache, No chest pain, No abdominal pain - No Nausea, No new weakness tingling or numbness, No Cough - shortness of breath  No problems updated.  No Known Allergies  Past Medical History:  Diagnosis Date   Anemia    during pregnancy 2006   Asthma    Headache    Hx of chlamydia infection 2016   Hypertension     Current Outpatient Medications on File Prior to Visit  Medication Sig Dispense Refill   loratadine (CLARITIN) 10 MG tablet Take 1 tablet (10 mg total) by mouth daily. 30 tablet 11   NIFEdipine (PROCARDIA-XL/NIFEDICAL-XL) 30 MG 24 hr tablet Take 1 tablet (30 mg total) by mouth daily. 90 tablet 1   sennosides-docusate sodium (SENOKOT-S) 8.6-50 MG tablet Take 1 tablet by mouth daily. 90 tablet 1   No current facility-administered medications on file prior to visit.    Objective:  Blood Pressure 132/88   Pulse 69   Respiration 16   Height '4\' 9"'$  (1.448 m)   Weight 130 lb 12.8 oz (59.3 kg)   Oxygen Saturation 99%   Body Mass Index 28.30 kg/m    Comprehensive ROS Pertinent positive and negative noted in HPI   Exam General appearance : Awake, alert, not in any distress. Speech Clear. Not toxic looking HEENT: Atraumatic and Normocephalic, pupils equally reactive to light and accomodation Neck: Supple, no JVD. No cervical lymphadenopathy.  Chest: Good air entry bilaterally, no added sounds  CVS: S1 S2 regular, no murmurs.  Abdomen: Bowel sounds present, Non tender and not distended with no gaurding, rigidity or rebound. Extremities: B/L Lower Ext shows no edema, both legs are warm to touch Neurology: Awake alert, and oriented X 3, CN II-XII intact, Non focal Skin: No Rash  Data Review Lab  Results  Component Value Date   HGBA1C 5.0 02/06/2021   HGBA1C 4.8 02/02/2019    Assessment & Plan  Diagnoses and all orders for this visit:  Essential hypertension Bp controlled  - < 130/80 Explained that having normal blood pressure is the goal and medications are helping to get to goal and maintain normal blood pressure. DIET: Limit salt intake, read nutrition labels to check salt content, limit fried and high fatty foods  Avoid using multisymptom OTC cold preparations that generally contain sudafed which can rise BP. Consult with pharmacist on best cold relief products to use for persons with HTN EXERCISE Discussed incorporating exercise such as walking - 30 minutes most days of the week and can do in 10 minute intervals    -     CMP14+EGFR -     NIFEdipine (PROCARDIA-XL/NIFEDICAL-XL) 30 MG 24 hr tablet; Take 1 tablet (30 mg total) by mouth daily.  Encounter for HCV screening test for low risk patient -     HCV Ab w Reflex to Quant PCR  Medication refill -     NIFEdipine (PROCARDIA-XL/NIFEDICAL-XL) 30 MG 24 hr tablet; Take 1 tablet (30 mg total) by mouth daily.  Language barrier interpreter Danielle Hoover for [place]   Medication management -     CBC with Differential/Platelet  Screening for diabetes mellitus -     Hemoglobin A1c  Lipid screening -  Lipid panel  Overweight (BMI 25.0-29.9) Discussed diet and exercise for person with BMI >25. Instructed: You must burn more calories than you eat. Losing 5 percent of your body weight should be considered a success. In the longer term, losing more than 15 percent of your body weight and staying at this weight is an extremely good result. However, keep in mind that even losing 5 percent of your body weight leads to important health benefits, so try not to get discouraged if you're not able to lose more than this. Will recheck weight in 3-6 months.   Visit for birth control pills maintenance Nurse will schedule Nexplanon removal      Patient have been counseled extensively about nutrition and exercise. Other issues discussed during this visit include: low cholesterol diet, weight control and daily exercise, foot care, annual eye examinations at Ophthalmology, importance of adherence with medications and regular follow-up. We also discussed long term complications of uncontrolled diabetes and hypertension.   No follow-ups on file.  The patient was given clear instructions to go to ER or return to medical center if symptoms don't improve, worsen or new problems develop. The patient verbalized understanding. The patient was told to call to get lab results if they haven't heard anything in the next week.   This note has been created with Surveyor, quantity. Any transcriptional errors are unintentional.   Kerin Perna, NP 01/03/2023, 8:20 AM

## 2023-01-04 LAB — HCV INTERPRETATION

## 2023-01-04 LAB — CBC WITH DIFFERENTIAL/PLATELET
Basophils Absolute: 0 10*3/uL (ref 0.0–0.2)
Basos: 0 %
EOS (ABSOLUTE): 0.3 10*3/uL (ref 0.0–0.4)
Eos: 3 %
Hematocrit: 44.2 % (ref 34.0–46.6)
Hemoglobin: 14.2 g/dL (ref 11.1–15.9)
Immature Grans (Abs): 0 10*3/uL (ref 0.0–0.1)
Immature Granulocytes: 0 %
Lymphocytes Absolute: 2.3 10*3/uL (ref 0.7–3.1)
Lymphs: 24 %
MCH: 25.6 pg — ABNORMAL LOW (ref 26.6–33.0)
MCHC: 32.1 g/dL (ref 31.5–35.7)
MCV: 80 fL (ref 79–97)
Monocytes Absolute: 0.4 10*3/uL (ref 0.1–0.9)
Monocytes: 4 %
Neutrophils Absolute: 6.4 10*3/uL (ref 1.4–7.0)
Neutrophils: 69 %
Platelets: 318 10*3/uL (ref 150–450)
RBC: 5.54 x10E6/uL — ABNORMAL HIGH (ref 3.77–5.28)
RDW: 13.4 % (ref 11.7–15.4)
WBC: 9.5 10*3/uL (ref 3.4–10.8)

## 2023-01-04 LAB — CMP14+EGFR
ALT: 14 IU/L (ref 0–32)
AST: 17 IU/L (ref 0–40)
Albumin/Globulin Ratio: 1.4 (ref 1.2–2.2)
Albumin: 4.2 g/dL (ref 3.9–4.9)
Alkaline Phosphatase: 78 IU/L (ref 44–121)
BUN/Creatinine Ratio: 12 (ref 9–23)
BUN: 8 mg/dL (ref 6–24)
Bilirubin Total: 0.3 mg/dL (ref 0.0–1.2)
CO2: 27 mmol/L (ref 20–29)
Calcium: 9.9 mg/dL (ref 8.7–10.2)
Chloride: 102 mmol/L (ref 96–106)
Creatinine, Ser: 0.65 mg/dL (ref 0.57–1.00)
Globulin, Total: 2.9 g/dL (ref 1.5–4.5)
Glucose: 93 mg/dL (ref 70–99)
Potassium: 3.4 mmol/L — ABNORMAL LOW (ref 3.5–5.2)
Sodium: 145 mmol/L — ABNORMAL HIGH (ref 134–144)
Total Protein: 7.1 g/dL (ref 6.0–8.5)
eGFR: 114 mL/min/{1.73_m2} (ref >59)

## 2023-01-04 LAB — LIPID PANEL
Chol/HDL Ratio: 4.5 ratio — ABNORMAL HIGH (ref 0.0–4.4)
Cholesterol, Total: 203 mg/dL — ABNORMAL HIGH (ref 100–199)
HDL: 45 mg/dL (ref >39)
LDL Chol Calc (NIH): 130 mg/dL — ABNORMAL HIGH (ref 0–99)
Triglycerides: 154 mg/dL — ABNORMAL HIGH (ref 0–149)
VLDL Cholesterol Cal: 28 mg/dL (ref 5–40)

## 2023-01-04 LAB — HEMOGLOBIN A1C
Est. average glucose Bld gHb Est-mCnc: 97 mg/dL
Hgb A1c MFr Bld: 5 % (ref 4.8–5.6)

## 2023-01-04 LAB — HCV AB W REFLEX TO QUANT PCR: HCV Ab: NONREACTIVE

## 2023-01-15 ENCOUNTER — Other Ambulatory Visit (INDEPENDENT_AMBULATORY_CARE_PROVIDER_SITE_OTHER): Payer: Self-pay | Admitting: Primary Care

## 2023-01-15 DIAGNOSIS — E782 Mixed hyperlipidemia: Secondary | ICD-10-CM

## 2023-01-15 MED ORDER — POTASSIUM CHLORIDE CRYS ER 10 MEQ PO TBCR: 20.0000 meq | EXTENDED_RELEASE_TABLET | Freq: Every day | ORAL | 0 refills | Status: DC

## 2023-01-15 MED ORDER — ATORVASTATIN CALCIUM 80 MG PO TABS: 80.0000 mg | ORAL_TABLET | Freq: Every day | ORAL | 3 refills | Status: DC

## 2023-02-04 ENCOUNTER — Encounter: Payer: Self-pay | Admitting: Family Medicine

## 2023-02-04 ENCOUNTER — Ambulatory Visit: Payer: Medicaid Other | Attending: Family Medicine | Admitting: Family Medicine

## 2023-02-04 VITALS — BP 136/85 | HR 86 | Ht <= 58 in | Wt 133.6 lb

## 2023-02-04 DIAGNOSIS — Z3046 Encounter for surveillance of implantable subdermal contraceptive: Secondary | ICD-10-CM | POA: Diagnosis not present

## 2023-02-04 NOTE — Patient Instructions (Signed)
Etonogestrel Implant What is this medication? ETONOGESTREL (et oh noe JES trel) prevents ovulation and pregnancy. It belongs to a group of medications called contraceptives. This medication is a progestin hormone. This medicine may be used for other purposes; ask your health care provider or pharmacist if you have questions. COMMON BRAND NAME(S): Implanon, Nexplanon What should I tell my care team before I take this medication? They need to know if you have any of these conditions: Abnormal vaginal bleeding Blood clots Blood vessel disease Breast, cervical, endometrial, ovarian, liver, or uterine cancer Diabetes Gallbladder disease Heart disease or recent heart attack High blood pressure High cholesterol or triglycerides Kidney disease Liver disease Migraine headaches Seizures Stroke Tobacco use An unusual or allergic reaction to etonogestrel, other medications, foods, dyes, or preservatives Pregnant or trying to get pregnant Breastfeeding How should I use this medication? This device is inserted just under the skin on the inner side of your upper arm by your care team. Talk to your care team about the use of this medication in children. Special care may be needed. Overdosage: If you think you have taken too much of this medicine contact a poison control center or emergency room at once. NOTE: This medicine is only for you. Do not share this medicine with others. What if I miss a dose? This does not apply. What may interact with this medication? Do not take this medication with any of the following: Amprenavir Fosamprenavir This medication may also interact with the following: Acitretin Aprepitant Armodafinil Bexarotene Bosentan Carbamazepine Certain antivirals for HIV or hepatitis Certain medications for fungal infections, such as fluconazole, ketoconazole, itraconazole, or  voriconazole Cyclosporine Felbamate Griseofulvin Lamotrigine Modafinil Oxcarbazepine Phenobarbital Phenytoin Primidone Rifabutin Rifampin Rifapentine St. John's wort Topiramate This list may not describe all possible interactions. Give your health care provider a list of all the medicines, herbs, non-prescription drugs, or dietary supplements you use. Also tell them if you smoke, drink alcohol, or use illegal drugs. Some items may interact with your medicine. What should I watch for while using this medication? Visit your care team for regular checks on your progress. Using this medication does not protect you or your partner against HIV or other sexually transmitted infections (STIs). You should be able to feel the implant by pressing your fingertips over the skin where it was inserted. Contact your care team if you cannot feel the implant, and use a non-hormonal birth control method (such as condoms) until your care team confirms that the implant is in place. Contact your care team if you think that the implant may have broken or become bent while in your arm. You will receive a user card from your care team after the implant is inserted. The card is a record of the location of the implant in your upper arm and when it should be removed. Keep this card with your health records. What side effects may I notice from receiving this medication? Side effects that you should report to your care team as soon as possible: Allergic reactions--skin rash, itching, hives, swelling of the face, lips, tongue, or throat Blood clot--pain, swelling, or warmth in the leg, shortness of breath, chest pain Gallbladder problems--severe stomach pain, nausea, vomiting, fever Increase in blood pressure Liver injury--right upper belly pain, loss of appetite, nausea, light-colored stool, dark yellow or brown urine, yellowing skin or eyes, unusual weakness or fatigue New or worsening migraines or headaches Pain,  redness, or irritation at injection site Stroke--sudden numbness or weakness of the face, arm,   or leg, trouble speaking, confusion, trouble walking, loss of balance or coordination, dizziness, severe headache, change in vision Unusual vaginal discharge, itching, or odor Worsening mood, feelings of depression Side effects that usually do not require medical attention (report to your care team if they continue or are bothersome): Breast pain or tenderness Dark patches of skin on the face or other sun-exposed areas Irregular menstrual cycles or spotting Nausea Weight gain This list may not describe all possible side effects. Call your doctor for medical advice about side effects. You may report side effects to FDA at 1-800-FDA-1088. Where should I keep my medication? This medication is given in a hospital or clinic and will not be stored at home. NOTE: This sheet is a summary. It may not cover all possible information. If you have questions about this medicine, talk to your doctor, pharmacist, or health care provider.  2023 Elsevier/Gold Standard (2022-05-28 00:00:00)  

## 2023-02-04 NOTE — Progress Notes (Signed)
Subjective:  Patient ID: Danielle Hoover, female    DOB: 12/11/1980  Age: 42 y.o. MRN: [id number]  CC: NEXPLANON REMOVAL   HPI Danielle Hoover is a 42 y.o. year old female patient of Juluis Mire with a history of hyperlipidemia, hypertension.  Interval History: She presents today for Nexplanon removal.  She has had this in place for the last 3-1/2 years and this was placed at the [facility]. Does not desire any additional birth control today. Past Medical History:  Diagnosis Date   Anemia    during pregnancy 2006   Asthma    Headache    Hx of chlamydia infection 2016   Hypertension     Past Surgical History:  Procedure Laterality Date   NO PAST SURGERIES      Family History  Problem Relation Age of Onset   Hypertension Mother    Alcohol abuse Neg Hx    Arthritis Neg Hx    Asthma Neg Hx    Birth defects Neg Hx    Cancer Neg Hx    COPD Neg Hx    Depression Neg Hx    Diabetes Neg Hx    Drug abuse Neg Hx    Early death Neg Hx    Hearing loss Neg Hx    Heart disease Neg Hx    Hyperlipidemia Neg Hx    Kidney disease Neg Hx    Learning disabilities Neg Hx    Mental illness Neg Hx    Mental retardation Neg Hx    Miscarriages / Stillbirths Neg Hx    Stroke Neg Hx    Vision loss Neg Hx    Varicose Veins Neg Hx     Social History   Socioeconomic History   Marital status: Divorced    Spouse name: Not on file   Number of children: Not on file   Years of education: Not on file   Highest education level: Not on file  Occupational History   Not on file  Tobacco Use   Smoking status: Never   Smokeless tobacco: Never  Vaping Use   Vaping Use: Never used  Substance and Sexual Activity   Alcohol use: No   Drug use: No   Sexual activity: Yes    Birth control/protection: None    Comment: last week  Other Topics Concern   Not on file  Social History Narrative   Makes socks   Not married- has a boy friend   Four children. Recent pregnancy     Social Determinants of Health   Financial Resource Strain: Not on file  Food Insecurity: Not on file  Transportation Needs: Not on file  Physical Activity: Not on file  Stress: Not on file  Social Connections: Not on file    No Known Allergies  Outpatient Medications Prior to Visit  Medication Sig Dispense Refill   atorvastatin (LIPITOR) 80 MG tablet Take 1 tablet (80 mg total) by mouth daily. 90 tablet 3   loratadine (CLARITIN) 10 MG tablet Take 1 tablet (10 mg total) by mouth daily. (Patient not taking: Reported on 01/03/2023) 30 tablet 11   NIFEdipine (PROCARDIA-XL/NIFEDICAL-XL) 30 MG 24 hr tablet Take 1 tablet (30 mg total) by mouth daily. 90 tablet 1   potassium chloride SA (KLOR-CON M) 10 MEQ tablet Take 2 tablets (20 mEq total) by mouth daily. 90 tablet 0   sennosides-docusate sodium (SENOKOT-S) 8.6-50 MG tablet Take 1 tablet by mouth daily. (Patient not taking: Reported on 01/03/2023) 90  tablet 1   No facility-administered medications prior to visit.     ROS Review of Systems  Constitutional:  Negative for activity change and appetite change.  HENT:  Negative for sinus pressure and sore throat.   Respiratory:  Negative for chest tightness, shortness of breath and wheezing.   Cardiovascular:  Negative for chest pain and palpitations.  Gastrointestinal:  Negative for abdominal distention, abdominal pain and constipation.  Genitourinary: Negative.   Musculoskeletal: Negative.   Psychiatric/Behavioral:  Negative for behavioral problems and dysphoric mood.    Objective:  BP 136/85   Pulse 86   Ht 4\' 9"  (1.448 m)   Wt 133 lb 9.6 oz (60.6 kg)   SpO2 100%   BMI 28.91 kg/m      02/04/2023   10:55 AM 01/03/2023    8:40 AM 07/04/2022    4:07 PM  BP/Weight  Systolic BP [id number] [id number]  Diastolic BP 85 88 88  Wt. (Lbs) 133.6 130.8 134.8  BMI 28.91 kg/m2 28.3 kg/m2 29.17 kg/m2      Physical Exam Constitutional:      Appearance: She is well-developed.  Cardiovascular:      Rate and Rhythm: Normal rate.     Heart sounds: Normal heart sounds. No murmur heard. Pulmonary:     Effort: Pulmonary effort is normal.     Breath sounds: Normal breath sounds. No wheezing or rales.  Chest:     Chest wall: No tenderness.  Abdominal:     General: Bowel sounds are normal. There is no distension.     Palpations: Abdomen is soft. There is no mass.     Tenderness: There is no abdominal tenderness.  Musculoskeletal:        General: Normal range of motion.     Right lower leg: No edema.     Left lower leg: No edema.  Skin:    Comments: Nexplanon in inferomedial left upper arm  Neurological:     Mental Status: She is alert and oriented to person, place, and time.  Psychiatric:        Mood and Affect: Mood normal.        Latest Ref Rng & Units 01/03/2023    9:08 AM 05/22/2021    4:01 PM 02/02/2019    4:04 PM  CMP  Glucose 70 - 99 mg/dL 93  111  101   BUN 6 - 24 mg/dL 8  10  9    Creatinine 0.57 - 1.00 mg/dL 0.65  0.72  0.50   Sodium 134 - 144 mmol/L 145  138  137   Potassium 3.5 - 5.2 mmol/L 3.4  3.8  3.8   Chloride 96 - 106 mmol/L 102  96  100   CO2 20 - 29 mmol/L 27  30  21    Calcium 8.7 - 10.2 mg/dL 9.9  10.0  9.4   Total Protein 6.0 - 8.5 g/dL 7.1  7.0  6.6   Total Bilirubin 0.0 - 1.2 mg/dL 0.3  0.3  0.4   Alkaline Phos 44 - 121 IU/L 78  76  51   AST 0 - 40 IU/L 17  18  22    ALT 0 - 32 IU/L 14  39  13     Lipid Panel     Component Value Date/Time   CHOL 203 (H) 01/03/2023 0908   TRIG 154 (H) 01/03/2023 0908   HDL 45 01/03/2023 0908   CHOLHDL 4.5 (H) 01/03/2023 0908   CHOLHDL 3 06/22/2015 1051  VLDL 11.8 06/22/2015 1051   LDLCALC 130 (H) 01/03/2023 0908    CBC    Component Value Date/Time   WBC 9.5 01/03/2023 0908   WBC 8.1 08/10/2019 1032   RBC 5.54 (H) 01/03/2023 0908   RBC 4.26 08/10/2019 1032   HGB 14.2 01/03/2023 0908   HGB 12.5 10/03/2014 0000   HCT 44.2 01/03/2023 0908   HCT 38 10/03/2014 0000   PLT 318 01/03/2023 0908   PLT 221  10/03/2014 0000   MCV 80 01/03/2023 0908   MCH 25.6 (L) 01/03/2023 0908   MCH 26.3 08/10/2019 1032   MCHC 32.1 01/03/2023 0908   MCHC 31.4 08/10/2019 1032   RDW 13.4 01/03/2023 0908   LYMPHSABS 2.3 01/03/2023 0908   MONOABS 0.3 06/22/2015 1051   EOSABS 0.3 01/03/2023 0908   BASOSABS 0.0 01/03/2023 0908    Lab Results  Component Value Date   HGBA1C 5.0 01/03/2023    Assessment & Plan:  1. Nexplanon removal Informed consent obtained Sterile procedure maintained Skin prepped with Betadine Anesthesia provided with 2% lidocaine with epi Procedure performed and Nexplanon rod removed Patient tolerated procedure well Pressure dressing applied to the arm and patient advised to keep this in place for 24 hours. Use NSAID as needed pain.    No orders of the defined types were placed in this encounter.   Follow-up: Return for Medical conditions with PCP.       Charlott Rakes, MD, FAAFP. [facility] [place], [place]   02/04/2023, 11:15 AM

## 2023-02-11 ENCOUNTER — Ambulatory Visit (INDEPENDENT_AMBULATORY_CARE_PROVIDER_SITE_OTHER): Payer: Medicaid Other

## 2023-02-11 DIAGNOSIS — Z30013 Encounter for initial prescription of injectable contraceptive: Secondary | ICD-10-CM | POA: Diagnosis not present

## 2023-02-11 LAB — POCT URINE PREGNANCY: Preg Test, Ur: NEGATIVE

## 2023-02-11 MED ORDER — MEDROXYPROGESTERONE ACETATE 150 MG/ML IM SUSP
150.0000 mg | Freq: Once | INTRAMUSCULAR | Status: AC
  Administered 2023-02-11: 150 mg via INTRAMUSCULAR

## 2023-02-23 ENCOUNTER — Other Ambulatory Visit (INDEPENDENT_AMBULATORY_CARE_PROVIDER_SITE_OTHER): Payer: Self-pay | Admitting: Primary Care

## 2023-02-24 NOTE — Telephone Encounter (Signed)
Requested medication (s) are due for refill today: No  Requested medication (s) are on the active medication list: Yes  Last refill:  01/15/23  Future visit scheduled: Yes  Notes to clinic:  Pharmacy requests 90 day supply.    Requested Prescriptions  Pending Prescriptions Disp Refills   KLOR-CON M10 10 MEQ tablet [Pharmacy Med Name: KLOR-CON M10 TABLET] 180 tablet 1    Sig: TAKE 2 TABLETS BY MOUTH DAILY     Endocrinology:  Minerals - Potassium Supplementation Failed - 02/23/2023  8:30 AM      Failed - K in normal range and within 360 days    Potassium  Date Value Ref Range Status  01/03/2023 3.4 (L) 3.5 - 5.2 mmol/L Final         Passed - Cr in normal range and within 360 days    Creatinine, Ser  Date Value Ref Range Status  01/03/2023 0.65 0.57 - 1.00 mg/dL Final         Passed - Valid encounter within last 12 months    Recent Outpatient Visits           2 weeks ago Nexplanon removal   [place] [facility] Hoy Register, MD   1 month ago Essential hypertension   [place] [facility] Grayce Sessions, NP   7 months ago Essential hypertension   [place] [facility] Grayce Sessions, NP   9 months ago Constipation, unspecified constipation type   [place] [facility] Grayce Sessions, NP   1 year ago Need for immunization against influenza   [place] [facility] Grayce Sessions, NP       Future Appointments             In 1 month Randa Evens, Kinnie Scales, NP [place] [facility]

## 2023-03-01 ENCOUNTER — Encounter (HOSPITAL_COMMUNITY): Payer: Self-pay

## 2023-03-01 ENCOUNTER — Ambulatory Visit (HOSPITAL_COMMUNITY)
Admission: EM | Admit: 2023-03-01 | Discharge: 2023-03-01 | Disposition: A | Discharge status: Home / Self Care | Payer: Medicaid Other | Attending: Physician Assistant | Admitting: Physician Assistant

## 2023-03-01 DIAGNOSIS — L259 Unspecified contact dermatitis, unspecified cause: Secondary | ICD-10-CM

## 2023-03-01 DIAGNOSIS — R300 Dysuria: Secondary | ICD-10-CM | POA: Diagnosis not present

## 2023-03-01 LAB — POCT URINALYSIS DIP (MANUAL ENTRY)
Bilirubin, UA: NEGATIVE
Glucose, UA: NEGATIVE mg/dL
Ketones, POC UA: NEGATIVE mg/dL
Nitrite, UA: NEGATIVE
Protein Ur, POC: 100 mg/dL — AB
Spec Grav, UA: 1.015 (ref 1.010–1.025)
Urobilinogen, UA: 0.2 E.U./dL
pH, UA: 6 (ref 5.0–8.0)

## 2023-03-01 MED ORDER — TRIAMCINOLONE ACETONIDE 0.025 % EX CREA: 1.0000 | TOPICAL_CREAM | Freq: Two times a day (BID) | CUTANEOUS | 0 refills | Status: AC

## 2023-03-01 MED ORDER — CEPHALEXIN 500 MG PO CAPS: 500.0000 mg | ORAL_CAPSULE | Freq: Three times a day (TID) | ORAL | 0 refills | Status: AC

## 2023-03-01 NOTE — ED Triage Notes (Signed)
Pt reports itchy vagina for 2 weeks. Pt reports Dysuria x 1 day, Denies any other symptoms.

## 2023-03-01 NOTE — ED Provider Notes (Signed)
[facility]   MRN: [id number] DOB: 1981-08-24  Subjective:   Danielle Hoover is a 42 y.o. female presenting for inguinal itching x 2 weeks and new dysuria for the last 2 days.  Patient denies any discharge.  Denies any new sexual partners.  No exposure to STDs.  Not pregnant per patient.   No current facility-administered medications for this encounter.  Current Outpatient Medications:    cephALEXin (KEFLEX) 500 MG capsule, Take 1 capsule (500 mg total) by mouth 3 (three) times daily for 7 days., Disp: 21 capsule, Rfl: 0   NIFEdipine (PROCARDIA-XL/NIFEDICAL-XL) 30 MG 24 hr tablet, Take 1 tablet (30 mg total) by mouth daily., Disp: 90 tablet, Rfl: 1   triamcinolone (KENALOG) 0.025 % cream, Apply 1 Application topically 2 (two) times daily for 10 days. Apply thin layer to affected areas as directed. Wash hands after use., Disp: 30 g, Rfl: 0   atorvastatin (LIPITOR) 80 MG tablet, Take 1 tablet (80 mg total) by mouth daily., Disp: 90 tablet, Rfl: 3   loratadine (CLARITIN) 10 MG tablet, Take 1 tablet (10 mg total) by mouth daily. (Patient not taking: Reported on 01/03/2023), Disp: 30 tablet, Rfl: 11   potassium chloride SA (KLOR-CON M) 10 MEQ tablet, Take 2 tablets (20 mEq total) by mouth daily., Disp: 90 tablet, Rfl: 0   sennosides-docusate sodium (SENOKOT-S) 8.6-50 MG tablet, Take 1 tablet by mouth daily. (Patient not taking: Reported on 01/03/2023), Disp: 90 tablet, Rfl: 1   No Known Allergies  Past Medical History:  Diagnosis Date   Anemia    during pregnancy 2006   Asthma    Headache    Hx of chlamydia infection 2016   Hypertension      Past Surgical History:  Procedure Laterality Date   NO PAST SURGERIES      Family History  Problem Relation Age of Onset   Hypertension Mother    Alcohol abuse Neg Hx    Arthritis Neg Hx    Asthma Neg Hx    Birth defects Neg Hx    Cancer Neg Hx    COPD Neg Hx    Depression Neg Hx    Diabetes Neg Hx    Drug abuse Neg Hx    Early  death Neg Hx    Hearing loss Neg Hx    Heart disease Neg Hx    Hyperlipidemia Neg Hx    Kidney disease Neg Hx    Learning disabilities Neg Hx    Mental illness Neg Hx    Mental retardation Neg Hx    Miscarriages / Stillbirths Neg Hx    Stroke Neg Hx    Vision loss Neg Hx    Varicose Veins Neg Hx     Social History   Tobacco Use   Smoking status: Never   Smokeless tobacco: Never  Vaping Use   Vaping Use: Never used  Substance Use Topics   Alcohol use: No   Drug use: No    ROS REFER TO HPI FOR PERTINENT POSITIVES AND NEGATIVES   Objective:   Vitals: BP (!) 140/83 (BP Location: Left Arm)   Pulse (!) 103   Temp 98.5 F (36.9 C) (Oral)   Resp 16   SpO2 98%   Physical Exam Vitals and nursing note reviewed.  Constitutional:      Appearance: Normal appearance.  Cardiovascular:     Rate and Rhythm: Normal rate.  Pulmonary:     Effort: Pulmonary effort is normal.  Abdominal:     General: Abdomen is flat.     Palpations: Abdomen is soft.     Tenderness: There is no abdominal tenderness. There is no right CVA tenderness or left CVA tenderness.  Genitourinary:    Comments: Performed GU exam without chaperone with patient's permission.  -External inguinal exam, pubis, shows shaved pubic hair and patchy irritation, no folliculitis.  -No signs of yeast, no discharge Neurological:     General: No focal deficit present.     Mental Status: She is alert.  Psychiatric:        Mood and Affect: Mood normal.        Behavior: Behavior normal.     Results for orders placed or performed during the hospital encounter of 03/01/23 (from the past 24 hour(s))  POC urinalysis dipstick     Status: Abnormal   Collection Time: 03/01/23 11:37 AM  Result Value Ref Range   Color, UA light yellow (A) yellow   Clarity, UA clear clear   Glucose, UA negative negative mg/dL   Bilirubin, UA negative negative   Ketones, POC UA negative negative mg/dL   Spec Grav, UA 1.610 9.604 - 1.025    Blood, UA moderate (A) negative   pH, UA 6.0 5.0 - 8.0   Protein Ur, POC =100 (A) negative mg/dL   Urobilinogen, UA 0.2 0.2 or 1.0 E.U./dL   Nitrite, UA Negative Negative   Leukocytes, UA Trace (A) Negative    Assessment and Plan :   PDMP not reviewed this encounter.  1. Skin irritation from shaving   2. Dysuria    Reassured no signs of folliculitis or yeast infection.  Appears to be some irritation from recent shaving.  Instructed to discontinue shaving until symptoms have resolved.  She needs to change out her razor.  Triamcinolone cream as directed to help with comfort.  She was also provided cephalexin for her dysuria.  Urine culture to be performed and antibiotic to be changed if needed pending result.  She will push fluids.  She will follow-up with her PCP if needed.    AllwardtCrist Infante, PA-C 03/01/23 1227

## 2023-03-01 NOTE — Discharge Instructions (Signed)
Thanks for coming in today.  Please avoid any further shaving in this area until symptoms have resolved.  You can use triamcinolone as directed for added relief in this area.  You can also apply Vaseline or diaper cream ointment to help with discomfort.  You can start on cephalexin for possible urinary tract infection.  Be sure to drink plenty of fluids.  Follow-up with your primary if worse or not improving symptoms.

## 2023-03-02 LAB — URINE CULTURE: Culture: NO GROWTH

## 2023-04-07 ENCOUNTER — Ambulatory Visit (INDEPENDENT_AMBULATORY_CARE_PROVIDER_SITE_OTHER): Payer: Medicaid Other | Admitting: Primary Care

## 2023-04-07 ENCOUNTER — Encounter (INDEPENDENT_AMBULATORY_CARE_PROVIDER_SITE_OTHER): Payer: Self-pay | Admitting: Primary Care

## 2023-04-07 VITALS — BP 138/88 | HR 91 | Temp 98.3°F | Resp 16 | Wt 135.0 lb

## 2023-04-07 DIAGNOSIS — Z603 Acculturation difficulty: Secondary | ICD-10-CM | POA: Diagnosis not present

## 2023-04-07 DIAGNOSIS — Z758 Other problems related to medical facilities and other health care: Secondary | ICD-10-CM

## 2023-04-07 DIAGNOSIS — E782 Mixed hyperlipidemia: Secondary | ICD-10-CM

## 2023-04-07 DIAGNOSIS — Z760 Encounter for issue of repeat prescription: Secondary | ICD-10-CM

## 2023-04-07 DIAGNOSIS — I10 Essential (primary) hypertension: Secondary | ICD-10-CM

## 2023-04-07 MED ORDER — ATORVASTATIN CALCIUM 80 MG PO TABS: 80.0000 mg | ORAL_TABLET | Freq: Every day | ORAL | 1 refills | Status: DC

## 2023-04-07 MED ORDER — HYDROCHLOROTHIAZIDE 12.5 MG PO TABS: 25.0000 mg | ORAL_TABLET | Freq: Every day | ORAL | 1 refills | Status: DC

## 2023-04-07 NOTE — Progress Notes (Signed)
HTN Medication refill

## 2023-04-07 NOTE — Progress Notes (Signed)
[facility]  Danielle Hoover, is a 42 y.o. female  [id number]  [id number]  DOB - September 24, 1981  Chief Complaint  Patient presents with   Hypertension       Subjective:   Danielle Hoover is a 42 y.o. female here today for a follow up visit. Patient has No headache, No chest pain, No abdominal pain - No Nausea, No new weakness tingling or numbness, No Cough - shortness of breath  No problems updated.  No Known Allergies  Past Medical History:  Diagnosis Date   Anemia    during pregnancy 2006   Asthma    Headache    Hx of chlamydia infection 2016   Hypertension     Current Outpatient Medications on File Prior to Visit  Medication Sig Dispense Refill   NIFEdipine (PROCARDIA-XL/NIFEDICAL-XL) 30 MG 24 hr tablet Take 1 tablet (30 mg total) by mouth daily. 90 tablet 1   loratadine (CLARITIN) 10 MG tablet Take 1 tablet (10 mg total) by mouth daily. (Patient not taking: Reported on 01/03/2023) 30 tablet 11   No current facility-administered medications on file prior to visit.    Objective:   Vitals:   04/07/23 1534 04/07/23 1558  BP: 139/89 138/88  Pulse: 91   Resp: 16   Temp: 98.3 F (36.8 C)   TempSrc: Oral   SpO2: 99%   Weight: 135 lb (61.2 kg)     Comprehensive ROS Pertinent positive and negative noted in HPI   Exam General appearance : Awake, alert, not in any distress. Speech Clear. Not toxic looking HEENT: Atraumatic and Normocephalic, pupils equally reactive to light and accomodation Neck: Supple, no JVD. No cervical lymphadenopathy.  Chest: Good air entry bilaterally, no added sounds  CVS: S1 S2 regular, no murmurs.  Abdomen: Bowel sounds present, Non tender and not distended with no gaurding, rigidity or rebound. Extremities: B/L Lower Ext shows no edema, both legs are warm to touch Neurology: Awake alert, and oriented X 3, CN II-XII intact, Non focal Skin: No Rash  Data Review Lab Results  Component Value Date   HGBA1C 5.0 01/03/2023    HGBA1C 5.0 02/06/2021   HGBA1C 4.8 02/02/2019    Assessment & Plan   Danielle Hoover was seen today for hypertension.  Diagnoses and all orders for this visit:  Essential hypertension BP goal - < 130/80 Explained that having normal blood pressure is the goal and medications are helping to get to goal and maintain normal blood pressure. DIET: Limit salt intake, read nutrition labels to check salt content, limit fried and high fatty foods  Avoid using multisymptom OTC cold preparations that generally contain sudafed which can rise BP. Consult with pharmacist on best cold relief products to use for persons with HTN EXERCISE Discussed incorporating exercise such as walking - 30 minutes most days of the week and can do in 10 minute intervals    -     CMP14+EGFR; Future  Mixed hyperlipidemia -     atorvastatin (LIPITOR) 80 MG tablet; Take 1 tablet (80 mg total) by mouth daily. -     Lipid panel; Future  Language barrier Danielle Hoover    Other orders/ 2/2Medication refill -     hydrochlorothiazide (HYDRODIURIL) 12.5 MG tablet; Take 2 tablets (25 mg total) by mouth daily.     Patient have been counseled extensively about nutrition and exercise. Other issues discussed during this visit include: low cholesterol diet, weight control and daily exercise, foot care, annual eye examinations at Ophthalmology, importance of adherence  with medications and regular follow-up. We also discussed long term complications of uncontrolled diabetes and hypertension.   Return in about 2 months (around 06/07/2023) for Bp  /fasting.  The patient was given clear instructions to go to ER or return to medical center if symptoms don't improve, worsen or new problems develop. The patient verbalized understanding. The patient was told to call to get lab results if they haven't heard anything in the next week.   This note has been created with Education officer, environmental. Any transcriptional errors are  unintentional.   Grayce Sessions, NP 04/12/2023, 2:19 PM

## 2023-05-13 ENCOUNTER — Ambulatory Visit (INDEPENDENT_AMBULATORY_CARE_PROVIDER_SITE_OTHER): Payer: Medicaid Other

## 2023-05-13 DIAGNOSIS — Z30013 Encounter for initial prescription of injectable contraceptive: Secondary | ICD-10-CM

## 2023-05-13 MED ORDER — MEDROXYPROGESTERONE ACETATE 150 MG/ML IM SUSY
150.0000 mg | PREFILLED_SYRINGE | Freq: Once | INTRAMUSCULAR | Status: AC
  Administered 2023-05-13: 150 mg via INTRAMUSCULAR

## 2023-05-13 NOTE — Progress Notes (Signed)
Pt came into for depo  Pt is on time for depo No HCG needed  Depo given in right deltoid Next depo is due between Sept 24-Oct 8

## 2023-06-09 ENCOUNTER — Ambulatory Visit (INDEPENDENT_AMBULATORY_CARE_PROVIDER_SITE_OTHER): Payer: Medicaid Other | Admitting: Primary Care

## 2023-08-05 ENCOUNTER — Encounter (INDEPENDENT_AMBULATORY_CARE_PROVIDER_SITE_OTHER): Payer: Self-pay | Admitting: Primary Care

## 2023-08-14 ENCOUNTER — Ambulatory Visit (INDEPENDENT_AMBULATORY_CARE_PROVIDER_SITE_OTHER): Payer: Medicaid Other | Admitting: Primary Care

## 2023-08-18 ENCOUNTER — Ambulatory Visit (INDEPENDENT_AMBULATORY_CARE_PROVIDER_SITE_OTHER): Payer: Medicaid Other | Admitting: Primary Care

## 2023-08-26 ENCOUNTER — Ambulatory Visit (INDEPENDENT_AMBULATORY_CARE_PROVIDER_SITE_OTHER): Payer: Medicaid Other | Admitting: Primary Care

## 2023-08-27 ENCOUNTER — Ambulatory Visit (INDEPENDENT_AMBULATORY_CARE_PROVIDER_SITE_OTHER): Payer: Medicaid Other | Admitting: Primary Care

## 2023-08-27 ENCOUNTER — Encounter (INDEPENDENT_AMBULATORY_CARE_PROVIDER_SITE_OTHER): Payer: Self-pay | Admitting: Primary Care

## 2023-08-27 DIAGNOSIS — E782 Mixed hyperlipidemia: Secondary | ICD-10-CM

## 2023-08-27 DIAGNOSIS — I10 Essential (primary) hypertension: Secondary | ICD-10-CM | POA: Diagnosis not present

## 2023-08-27 DIAGNOSIS — Z760 Encounter for issue of repeat prescription: Secondary | ICD-10-CM | POA: Diagnosis not present

## 2023-08-27 MED ORDER — NIFEDIPINE ER OSMOTIC RELEASE 30 MG PO TB24: 30.0000 mg | ORAL_TABLET | Freq: Every day | ORAL | 1 refills | Status: DC

## 2023-08-27 MED ORDER — HYDROCHLOROTHIAZIDE 12.5 MG PO TABS: 12.5000 mg | ORAL_TABLET | Freq: Every day | ORAL | 1 refills | Status: DC

## 2023-08-27 NOTE — Progress Notes (Signed)
Right knee pain, 6/10 for Several years Pain level today is 2.

## 2023-08-28 ENCOUNTER — Ambulatory Visit (INDEPENDENT_AMBULATORY_CARE_PROVIDER_SITE_OTHER): Payer: Medicaid Other

## 2023-08-28 DIAGNOSIS — Z30013 Encounter for initial prescription of injectable contraceptive: Secondary | ICD-10-CM | POA: Diagnosis not present

## 2023-08-28 LAB — LIPID PANEL
Chol/HDL Ratio: 4.9 ratio — ABNORMAL HIGH (ref 0.0–4.4)
Cholesterol, Total: 197 mg/dL (ref 100–199)
HDL: 40 mg/dL (ref >39)
LDL Chol Calc (NIH): 136 mg/dL — ABNORMAL HIGH (ref 0–99)
Triglycerides: 114 mg/dL (ref 0–149)
VLDL Cholesterol Cal: 21 mg/dL (ref 5–40)

## 2023-08-28 LAB — CMP14+EGFR
ALT: 15 [IU]/L (ref 0–32)
AST: 16 [IU]/L (ref 0–40)
Albumin: 3.8 g/dL — ABNORMAL LOW (ref 3.9–4.9)
Alkaline Phosphatase: 88 [IU]/L (ref 44–121)
BUN/Creatinine Ratio: 18 (ref 9–23)
BUN: 11 mg/dL (ref 6–24)
Bilirubin Total: 0.4 mg/dL (ref 0.0–1.2)
CO2: 22 mmol/L (ref 20–29)
Calcium: 9.6 mg/dL (ref 8.7–10.2)
Chloride: 104 mmol/L (ref 96–106)
Creatinine, Ser: 0.62 mg/dL (ref 0.57–1.00)
Globulin, Total: 3 g/dL (ref 1.5–4.5)
Glucose: 80 mg/dL (ref 70–99)
Potassium: 3.8 mmol/L (ref 3.5–5.2)
Sodium: 143 mmol/L (ref 134–144)
Total Protein: 6.8 g/dL (ref 6.0–8.5)
eGFR: 115 mL/min/{1.73_m2} (ref >59)

## 2023-08-28 LAB — POCT URINE PREGNANCY: Preg Test, Ur: NEGATIVE

## 2023-08-28 MED ORDER — MEDROXYPROGESTERONE ACETATE 150 MG/ML IM SUSY
150.0000 mg | PREFILLED_SYRINGE | Freq: Once | INTRAMUSCULAR | Status: AC
Start: 2023-08-28 — End: 2023-08-28
  Administered 2023-08-28: 150 mg via INTRAMUSCULAR

## 2023-08-28 NOTE — Progress Notes (Signed)
Pt came into the office for her depo Pt is not on time for depo Pt was suppose to return for depo Sept 24-Oct 8  HCG needed Depo given in left glute  Next depo is due between Jan 9-Jan23

## 2023-08-31 NOTE — Progress Notes (Signed)
[facility]  Danielle Hoover, is a 42 y.o. female  WUJ:[id number]  NFA:[id number]  DOB - 1981/03/31  Chief Complaint  Patient presents with   Hypertension       Subjective:   Danielle Hoover is a 42 y.o. female here today for a follow up visit HTN. Patient has No headache, No chest pain, No abdominal pain - No Nausea, No new weakness tingling or numbness, No Cough - shortness of breath She c/o right knee pain, 6/10 for Several years- rates pain 2/10.  No problems updated.  No Known Allergies  Past Medical History:  Diagnosis Date   Anemia    during pregnancy 2006   Asthma    Headache    Hx of chlamydia infection 2016   Hypertension     Current Outpatient Medications on File Prior to Visit  Medication Sig Dispense Refill   loratadine (CLARITIN) 10 MG tablet Take 1 tablet (10 mg total) by mouth daily. 30 tablet 11   atorvastatin (LIPITOR) 80 MG tablet Take 1 tablet (80 mg total) by mouth daily. (Patient not taking: Reported on 08/27/2023) 90 tablet 1   No current facility-administered medications on file prior to visit.    Objective:   Vitals:   08/27/23 1014  BP: 133/84  Pulse: 74  Resp: 16  Temp: (!) 97.5 F (36.4 C)  TempSrc: Oral  SpO2: 98%  Weight: 136 lb (61.7 kg)    Comprehensive ROS Pertinent positive and negative noted in HPI   Exam General appearance : Awake, alert, not in any distress. Speech Clear. Not toxic looking HEENT: Atraumatic and Normocephalic, pupils equally reactive to light and accomodation Neck: Supple, no JVD. No cervical lymphadenopathy.  Chest: Good air entry bilaterally, no added sounds  CVS: S1 S2 regular, no murmurs.  Abdomen: Bowel sounds present, Non tender and not distended with no gaurding, rigidity or rebound. Extremities: B/L Lower Ext shows no edema, both legs are warm to touch Neurology: Awake alert, and oriented X 3, CN II-XII intact, Non focal Skin: No Rash  Data Review Lab Results  Component Value Date    HGBA1C 5.0 01/03/2023   HGBA1C 5.0 02/06/2021   HGBA1C 4.8 02/02/2019    Assessment & Plan   Danielle Hoover was seen today for hypertension.  Diagnoses and all orders for this visit:  Medication refill -     NIFEdipine (PROCARDIA-XL/NIFEDICAL-XL) 30 MG 24 hr tablet; Take 1 tablet (30 mg total) by mouth daily.  Essential hypertension BP goal - < 130/80 Explained that having normal blood pressure is the goal and medications are helping to get to goal and maintain normal blood pressure. DIET: Limit salt intake, read nutrition labels to check salt content, limit fried and high fatty foods  Avoid using multisymptom OTC cold preparations that generally contain sudafed which can rise BP. Consult with pharmacist on best cold relief products to use for persons with HTN EXERCISE Discussed incorporating exercise such as walking - 30 minutes most days of the week and can do in 10 minute intervals    -     NIFEdipine (PROCARDIA-XL/NIFEDICAL-XL) 30 MG 24 hr tablet; Take 1 tablet (30 mg total) by mouth daily. -     CMP14+EGFR  Mixed hyperlipidemia -     Lipid panel  Other orders -     hydrochlorothiazide (HYDRODIURIL) 12.5 MG tablet; Take 1 tablet (12.5 mg total) by mouth daily. (Patient not taking: Reported on 08/27/2023)     Patient have been counseled extensively about nutrition and exercise.  Other issues discussed during this visit include: low cholesterol diet, weight control and daily exercise, foot care, annual eye examinations at Ophthalmology, importance of adherence with medications and regular follow-up. We also discussed long term complications of uncontrolled diabetes and hypertension.   Return in about 3 months (around 11/27/2023).  The patient was given clear instructions to go to ER or return to medical center if symptoms don't improve, worsen or new problems develop. The patient verbalized understanding. The patient was told to call to get lab results if they haven't heard anything in the  next week.   This note has been created with Education officer, environmental. Any transcriptional errors are unintentional.   Grayce Sessions, NP 08/31/2023, 11:25 PM

## 2023-09-12 ENCOUNTER — Encounter (INDEPENDENT_AMBULATORY_CARE_PROVIDER_SITE_OTHER): Payer: Self-pay

## 2023-09-28 ENCOUNTER — Emergency Department (HOSPITAL_COMMUNITY): Payer: Medicaid Other | Admitting: Anesthesiology

## 2023-09-28 ENCOUNTER — Emergency Department (HOSPITAL_COMMUNITY): Payer: Medicaid Other

## 2023-09-28 ENCOUNTER — Inpatient Hospital Stay (HOSPITAL_COMMUNITY)
Admission: EM | Admit: 2023-09-28 | Discharge: 2023-09-30 | DRG: 419 | Disposition: A | Discharge status: Home / Self Care | Payer: Medicaid Other | Attending: General Surgery | Admitting: General Surgery

## 2023-09-28 ENCOUNTER — Other Ambulatory Visit: Payer: Self-pay

## 2023-09-28 ENCOUNTER — Encounter (HOSPITAL_COMMUNITY): Admission: EM | Disposition: A | Discharge status: Home / Self Care | Payer: Self-pay | Source: Home / Self Care

## 2023-09-28 ENCOUNTER — Encounter (HOSPITAL_COMMUNITY): Payer: Self-pay | Admitting: *Deleted

## 2023-09-28 DIAGNOSIS — Z4889 Encounter for other specified surgical aftercare: Secondary | ICD-10-CM | POA: Diagnosis not present

## 2023-09-28 DIAGNOSIS — K810 Acute cholecystitis: Secondary | ICD-10-CM | POA: Diagnosis present

## 2023-09-28 DIAGNOSIS — Z9049 Acquired absence of other specified parts of digestive tract: Secondary | ICD-10-CM | POA: Diagnosis not present

## 2023-09-28 DIAGNOSIS — Z8249 Family history of ischemic heart disease and other diseases of the circulatory system: Secondary | ICD-10-CM

## 2023-09-28 DIAGNOSIS — R7401 Elevation of levels of liver transaminase levels: Secondary | ICD-10-CM | POA: Diagnosis present

## 2023-09-28 DIAGNOSIS — D509 Iron deficiency anemia, unspecified: Secondary | ICD-10-CM | POA: Diagnosis present

## 2023-09-28 DIAGNOSIS — R748 Abnormal levels of other serum enzymes: Secondary | ICD-10-CM

## 2023-09-28 DIAGNOSIS — K8050 Calculus of bile duct without cholangitis or cholecystitis without obstruction: Secondary | ICD-10-CM | POA: Diagnosis not present

## 2023-09-28 DIAGNOSIS — Z603 Acculturation difficulty: Secondary | ICD-10-CM | POA: Diagnosis present

## 2023-09-28 DIAGNOSIS — I10 Essential (primary) hypertension: Secondary | ICD-10-CM | POA: Diagnosis present

## 2023-09-28 DIAGNOSIS — R1011 Right upper quadrant pain: Secondary | ICD-10-CM

## 2023-09-28 DIAGNOSIS — E876 Hypokalemia: Secondary | ICD-10-CM | POA: Diagnosis present

## 2023-09-28 DIAGNOSIS — K8064 Calculus of gallbladder and bile duct with chronic cholecystitis without obstruction: Principal | ICD-10-CM | POA: Diagnosis present

## 2023-09-28 DIAGNOSIS — K838 Other specified diseases of biliary tract: Secondary | ICD-10-CM | POA: Diagnosis not present

## 2023-09-28 HISTORY — PX: INTRAOPERATIVE CHOLANGIOGRAM: SHX5230

## 2023-09-28 HISTORY — PX: CHOLECYSTECTOMY: SHX55

## 2023-09-28 LAB — CBC
HCT: 41.3 % (ref 36.0–46.0)
Hemoglobin: 13.5 g/dL (ref 12.0–15.0)
MCH: 25 pg — ABNORMAL LOW (ref 26.0–34.0)
MCHC: 32.7 g/dL (ref 30.0–36.0)
MCV: 76.3 fL — ABNORMAL LOW (ref 80.0–100.0)
Platelets: 298 10*3/uL (ref 150–400)
RBC: 5.41 MIL/uL — ABNORMAL HIGH (ref 3.87–5.11)
RDW: 14.5 % (ref 11.5–15.5)
WBC: 10.8 10*3/uL — ABNORMAL HIGH (ref 4.0–10.5)
nRBC: 0 % (ref 0.0–0.2)

## 2023-09-28 LAB — COMPREHENSIVE METABOLIC PANEL
ALT: 228 U/L — ABNORMAL HIGH (ref 0–44)
AST: 194 U/L — ABNORMAL HIGH (ref 15–41)
Albumin: 3.5 g/dL (ref 3.5–5.0)
Alkaline Phosphatase: 234 U/L — ABNORMAL HIGH (ref 38–126)
Anion gap: 12 (ref 5–15)
BUN: 15 mg/dL (ref 6–20)
CO2: 26 mmol/L (ref 22–32)
Calcium: 9.3 mg/dL (ref 8.9–10.3)
Chloride: 103 mmol/L (ref 98–111)
Creatinine, Ser: 0.8 mg/dL (ref 0.44–1.00)
GFR, Estimated: >60 mL/min (ref >60)
Glucose, Bld: 107 mg/dL — ABNORMAL HIGH (ref 70–99)
Potassium: 2.9 mmol/L — ABNORMAL LOW (ref 3.5–5.1)
Sodium: 141 mmol/L (ref 135–145)
Total Bilirubin: 1.4 mg/dL — ABNORMAL HIGH (ref <1.2)
Total Protein: 7.2 g/dL (ref 6.5–8.1)

## 2023-09-28 LAB — URINALYSIS, ROUTINE W REFLEX MICROSCOPIC
Bilirubin Urine: NEGATIVE
Glucose, UA: NEGATIVE mg/dL
Ketones, ur: NEGATIVE mg/dL
Leukocytes,Ua: NEGATIVE
Nitrite: NEGATIVE
Protein, ur: >=300 mg/dL — AB
Specific Gravity, Urine: 1.02 (ref 1.005–1.030)
pH: 5 (ref 5.0–8.0)

## 2023-09-28 LAB — HCG, SERUM, QUALITATIVE: Preg, Serum: NEGATIVE

## 2023-09-28 LAB — LIPASE, BLOOD: Lipase: 69 U/L — ABNORMAL HIGH (ref 11–51)

## 2023-09-28 SURGERY — LAPAROSCOPIC CHOLECYSTECTOMY
Anesthesia: General | Site: Abdomen

## 2023-09-28 MED ORDER — MIDAZOLAM HCL 2 MG/2ML IJ SOLN
INTRAMUSCULAR | Status: DC | PRN
  Administered 2023-09-28: 2 mg via INTRAVENOUS

## 2023-09-28 MED ORDER — HYDROCHLOROTHIAZIDE 12.5 MG PO TABS
12.5000 mg | ORAL_TABLET | Freq: Every day | ORAL | Status: DC
Start: 2023-09-28 — End: 2023-09-30
  Administered 2023-09-28 – 2023-09-30 (×3): 12.5 mg via ORAL
  Filled 2023-09-28 (×3): qty 1

## 2023-09-28 MED ORDER — DIPHENHYDRAMINE HCL 50 MG/ML IJ SOLN: 12.5000 mg | Freq: Four times a day (QID) | INTRAMUSCULAR | Status: DC | PRN

## 2023-09-28 MED ORDER — NIFEDIPINE ER OSMOTIC RELEASE 30 MG PO TB24
30.0000 mg | ORAL_TABLET | Freq: Every day | ORAL | Status: DC
  Administered 2023-09-28 – 2023-09-30 (×3): 30 mg via ORAL
  Filled 2023-09-28 (×3): qty 1

## 2023-09-28 MED ORDER — SIMETHICONE 80 MG PO CHEW: 40.0000 mg | CHEWABLE_TABLET | Freq: Four times a day (QID) | ORAL | Status: DC | PRN

## 2023-09-28 MED ORDER — ACETAMINOPHEN 10 MG/ML IV SOLN
1000.0000 mg | Freq: Once | INTRAVENOUS | Status: DC | PRN
  Administered 2023-09-28: 1000 mg via INTRAVENOUS

## 2023-09-28 MED ORDER — KETOROLAC TROMETHAMINE 30 MG/ML IJ SOLN
INTRAMUSCULAR | Status: DC | PRN
  Administered 2023-09-28: 30 mg via INTRAVENOUS

## 2023-09-28 MED ORDER — DEXAMETHASONE SODIUM PHOSPHATE 10 MG/ML IJ SOLN
INTRAMUSCULAR | Status: DC | PRN
  Administered 2023-09-28: 10 mg via INTRAVENOUS

## 2023-09-28 MED ORDER — ONDANSETRON HCL 4 MG/2ML IJ SOLN
4.0000 mg | Freq: Once | INTRAMUSCULAR | Status: AC
  Administered 2023-09-28: 4 mg via INTRAVENOUS
  Filled 2023-09-28: qty 2

## 2023-09-28 MED ORDER — SODIUM CHLORIDE (PF) 0.9 % IJ SOLN
INTRAMUSCULAR | Status: AC
Start: 2023-09-28 — End: ?
  Filled 2023-09-28: qty 20

## 2023-09-28 MED ORDER — ROCURONIUM BROMIDE 10 MG/ML (PF) SYRINGE
PREFILLED_SYRINGE | INTRAVENOUS | Status: AC
Start: 2023-09-28 — End: ?
  Filled 2023-09-28: qty 10

## 2023-09-28 MED ORDER — ONDANSETRON HCL 4 MG/2ML IJ SOLN
INTRAMUSCULAR | Status: AC
  Filled 2023-09-28: qty 2

## 2023-09-28 MED ORDER — FENTANYL CITRATE (PF) 100 MCG/2ML IJ SOLN
25.0000 ug | INTRAMUSCULAR | Status: DC | PRN
Start: 2023-09-28 — End: 2023-09-28

## 2023-09-28 MED ORDER — BUPIVACAINE-EPINEPHRINE 0.25% -1:200000 IJ SOLN
INTRAMUSCULAR | Status: DC | PRN
  Administered 2023-09-28: 11 mL
  Administered 2023-09-28: 10 mL

## 2023-09-28 MED ORDER — HEPARIN SODIUM (PORCINE) 5000 UNIT/ML IJ SOLN
5000.0000 [IU] | Freq: Three times a day (TID) | INTRAMUSCULAR | Status: AC
  Administered 2023-09-28: 5000 [IU] via SUBCUTANEOUS
  Filled 2023-09-28: qty 1

## 2023-09-28 MED ORDER — HYDROMORPHONE HCL 1 MG/ML IJ SOLN
0.5000 mg | INTRAMUSCULAR | Status: DC | PRN
  Administered 2023-09-28 – 2023-09-29 (×5): 0.5 mg via INTRAVENOUS
  Filled 2023-09-28 (×5): qty 0.5

## 2023-09-28 MED ORDER — HEPARIN SODIUM (PORCINE) 5000 UNIT/ML IJ SOLN: 5000.0000 [IU] | Freq: Three times a day (TID) | INTRAMUSCULAR | Status: DC

## 2023-09-28 MED ORDER — POTASSIUM CHLORIDE 10 MEQ/100ML IV SOLN
INTRAVENOUS | Status: AC
  Administered 2023-09-28: 10 meq via INTRAVENOUS
  Filled 2023-09-28: qty 100

## 2023-09-28 MED ORDER — SODIUM CHLORIDE 0.9 % IV SOLN
2.0000 g | Freq: Once | INTRAVENOUS | Status: AC
  Administered 2023-09-28: 2 g via INTRAVENOUS
  Filled 2023-09-28: qty 20

## 2023-09-28 MED ORDER — ACETAMINOPHEN 160 MG/5ML PO SOLN: 1000.0000 mg | Freq: Once | ORAL | Status: DC | PRN

## 2023-09-28 MED ORDER — LIDOCAINE 2% (20 MG/ML) 5 ML SYRINGE
INTRAMUSCULAR | Status: DC | PRN
  Administered 2023-09-28: 60 mg via INTRAVENOUS

## 2023-09-28 MED ORDER — ONDANSETRON HCL 4 MG/2ML IJ SOLN: 4.0000 mg | Freq: Four times a day (QID) | INTRAMUSCULAR | Status: DC | PRN

## 2023-09-28 MED ORDER — SODIUM CHLORIDE 0.9 % IV BOLUS
1000.0000 mL | Freq: Once | INTRAVENOUS | Status: AC
  Administered 2023-09-28: 1000 mL via INTRAVENOUS

## 2023-09-28 MED ORDER — OXYCODONE HCL 5 MG PO TABS
5.0000 mg | ORAL_TABLET | ORAL | Status: DC | PRN
  Administered 2023-09-28 – 2023-09-29 (×2): 10 mg via ORAL
  Administered 2023-09-29: 5 mg via ORAL
  Administered 2023-09-30: 10 mg via ORAL
  Filled 2023-09-28 (×4): qty 2

## 2023-09-28 MED ORDER — PHENYLEPHRINE 80 MCG/ML (10ML) SYRINGE FOR IV PUSH (FOR BLOOD PRESSURE SUPPORT)
PREFILLED_SYRINGE | INTRAVENOUS | Status: DC | PRN
  Administered 2023-09-28 (×3): 80 ug via INTRAVENOUS

## 2023-09-28 MED ORDER — INDOCYANINE GREEN 25 MG IV SOLR
2.5000 mg | Freq: Once | INTRAVENOUS | Status: AC
  Administered 2023-09-28: 2.5 mg via INTRAVENOUS

## 2023-09-28 MED ORDER — OXYCODONE HCL 5 MG PO TABS
ORAL_TABLET | ORAL | Status: AC
  Administered 2023-09-28: 5 mg
  Filled 2023-09-28: qty 1

## 2023-09-28 MED ORDER — BUPIVACAINE-EPINEPHRINE (PF) 0.25% -1:200000 IJ SOLN
INTRAMUSCULAR | Status: AC
  Filled 2023-09-28: qty 30

## 2023-09-28 MED ORDER — CHLORHEXIDINE GLUCONATE 0.12 % MT SOLN
15.0000 mL | Freq: Once | OROMUCOSAL | Status: AC
  Administered 2023-09-28: 15 mL via OROMUCOSAL

## 2023-09-28 MED ORDER — SODIUM CHLORIDE 0.9 % IV SOLN: INTRAVENOUS | Status: DC | PRN

## 2023-09-28 MED ORDER — FENTANYL CITRATE PF 50 MCG/ML IJ SOSY
50.0000 ug | PREFILLED_SYRINGE | Freq: Once | INTRAMUSCULAR | Status: AC
  Administered 2023-09-28: 50 ug via INTRAVENOUS
  Filled 2023-09-28: qty 1

## 2023-09-28 MED ORDER — ORAL CARE MOUTH RINSE: 15.0000 mL | Freq: Once | OROMUCOSAL | Status: AC

## 2023-09-28 MED ORDER — DOCUSATE SODIUM 100 MG PO CAPS
100.0000 mg | ORAL_CAPSULE | Freq: Two times a day (BID) | ORAL | Status: DC
  Administered 2023-09-28 – 2023-09-30 (×5): 100 mg via ORAL
  Filled 2023-09-28 (×5): qty 1

## 2023-09-28 MED ORDER — DROPERIDOL 2.5 MG/ML IJ SOLN: 0.6250 mg | Freq: Once | INTRAMUSCULAR | Status: DC | PRN

## 2023-09-28 MED ORDER — OXYCODONE HCL 5 MG/5ML PO SOLN: 5.0000 mg | Freq: Once | ORAL | Status: DC | PRN

## 2023-09-28 MED ORDER — OXYCODONE HCL 5 MG PO TABS: 5.0000 mg | ORAL_TABLET | Freq: Once | ORAL | Status: DC | PRN

## 2023-09-28 MED ORDER — FENTANYL CITRATE (PF) 100 MCG/2ML IJ SOLN
INTRAMUSCULAR | Status: AC
  Administered 2023-09-28: 50 ug via INTRAVENOUS
  Filled 2023-09-28: qty 2

## 2023-09-28 MED ORDER — LACTATED RINGERS IV SOLN
INTRAVENOUS | Status: DC
Start: 2023-09-28 — End: 2023-09-28

## 2023-09-28 MED ORDER — FENTANYL CITRATE (PF) 250 MCG/5ML IJ SOLN
INTRAMUSCULAR | Status: AC
  Filled 2023-09-28: qty 5

## 2023-09-28 MED ORDER — IBUPROFEN 600 MG PO TABS
600.0000 mg | ORAL_TABLET | Freq: Four times a day (QID) | ORAL | Status: DC | PRN
  Administered 2023-09-29 (×2): 600 mg via ORAL
  Filled 2023-09-28 (×2): qty 1

## 2023-09-28 MED ORDER — ACETAMINOPHEN 500 MG PO TABS: 1000.0000 mg | ORAL_TABLET | Freq: Once | ORAL | Status: DC | PRN

## 2023-09-28 MED ORDER — ONDANSETRON 4 MG PO TBDP: 4.0000 mg | ORAL_TABLET | Freq: Four times a day (QID) | ORAL | Status: DC | PRN

## 2023-09-28 MED ORDER — DEXAMETHASONE SODIUM PHOSPHATE 10 MG/ML IJ SOLN
INTRAMUSCULAR | Status: AC
  Filled 2023-09-28: qty 1

## 2023-09-28 MED ORDER — ACETAMINOPHEN 500 MG PO TABS
1000.0000 mg | ORAL_TABLET | Freq: Four times a day (QID) | ORAL | Status: DC
  Administered 2023-09-28 – 2023-09-30 (×7): 1000 mg via ORAL
  Filled 2023-09-28 (×7): qty 2

## 2023-09-28 MED ORDER — LIDOCAINE 2% (20 MG/ML) 5 ML SYRINGE
INTRAMUSCULAR | Status: AC
Start: 2023-09-28 — End: ?
  Filled 2023-09-28: qty 5

## 2023-09-28 MED ORDER — POTASSIUM CHLORIDE 10 MEQ/100ML IV SOLN
10.0000 meq | INTRAVENOUS | Status: AC
Start: 2023-09-28 — End: 2023-09-28
  Administered 2023-09-28: 10 meq via INTRAVENOUS
  Filled 2023-09-28: qty 100

## 2023-09-28 MED ORDER — FENTANYL CITRATE (PF) 100 MCG/2ML IJ SOLN
25.0000 ug | INTRAMUSCULAR | Status: DC | PRN
  Administered 2023-09-28: 50 ug via INTRAVENOUS

## 2023-09-28 MED ORDER — ACETAMINOPHEN 10 MG/ML IV SOLN
INTRAVENOUS | Status: AC
  Filled 2023-09-28: qty 100

## 2023-09-28 MED ORDER — PROPOFOL 10 MG/ML IV BOLUS
INTRAVENOUS | Status: DC | PRN
  Administered 2023-09-28: 160 mg via INTRAVENOUS

## 2023-09-28 MED ORDER — KETOROLAC TROMETHAMINE 30 MG/ML IJ SOLN
INTRAMUSCULAR | Status: AC
  Filled 2023-09-28: qty 1

## 2023-09-28 MED ORDER — MIDAZOLAM HCL 2 MG/2ML IJ SOLN
INTRAMUSCULAR | Status: AC
Start: 2023-09-28 — End: ?
  Filled 2023-09-28: qty 2

## 2023-09-28 MED ORDER — POTASSIUM CHLORIDE 20 MEQ PO PACK
80.0000 meq | PACK | Freq: Once | ORAL | Status: AC
  Administered 2023-09-28: 80 meq via ORAL
  Filled 2023-09-28: qty 4

## 2023-09-28 MED ORDER — SODIUM CHLORIDE 0.9% FLUSH
INTRAVENOUS | Status: DC | PRN
  Administered 2023-09-28: 10 mL via INTRAVENOUS
  Administered 2023-09-28 (×2): 5 mL via INTRAVENOUS

## 2023-09-28 MED ORDER — SUGAMMADEX SODIUM 200 MG/2ML IV SOLN
INTRAVENOUS | Status: DC | PRN
  Administered 2023-09-28: 200 mg via INTRAVENOUS

## 2023-09-28 MED ORDER — ONDANSETRON HCL 4 MG/2ML IJ SOLN
INTRAMUSCULAR | Status: DC | PRN
  Administered 2023-09-28: 4 mg via INTRAVENOUS

## 2023-09-28 MED ORDER — DIPHENHYDRAMINE HCL 12.5 MG/5ML PO ELIX: 12.5000 mg | ORAL_SOLUTION | Freq: Four times a day (QID) | ORAL | Status: DC | PRN

## 2023-09-28 MED ORDER — TRAMADOL HCL 50 MG PO TABS
50.0000 mg | ORAL_TABLET | Freq: Four times a day (QID) | ORAL | Status: DC | PRN
  Administered 2023-09-29 (×2): 50 mg via ORAL
  Filled 2023-09-28 (×2): qty 1

## 2023-09-28 MED ORDER — 0.9 % SODIUM CHLORIDE (POUR BTL) OPTIME
TOPICAL | Status: DC | PRN
  Administered 2023-09-28: 1000 mL

## 2023-09-28 MED ORDER — KCL IN DEXTROSE-NACL 20-5-0.45 MEQ/L-%-% IV SOLN
INTRAVENOUS | Status: AC
  Filled 2023-09-28 (×2): qty 1000

## 2023-09-28 MED ORDER — FENTANYL CITRATE (PF) 250 MCG/5ML IJ SOLN
INTRAMUSCULAR | Status: DC | PRN
  Administered 2023-09-28 (×2): 50 ug via INTRAVENOUS
  Administered 2023-09-28 (×2): 25 ug via INTRAVENOUS
  Administered 2023-09-28: 100 ug via INTRAVENOUS

## 2023-09-28 MED ORDER — METOPROLOL TARTRATE 5 MG/5ML IV SOLN
5.0000 mg | Freq: Four times a day (QID) | INTRAVENOUS | Status: DC | PRN
Start: 2023-09-28 — End: 2023-09-30

## 2023-09-28 MED ORDER — PROPOFOL 10 MG/ML IV BOLUS
INTRAVENOUS | Status: AC
Start: 2023-09-28 — End: ?
  Filled 2023-09-28: qty 20

## 2023-09-28 MED ORDER — ROCURONIUM BROMIDE 10 MG/ML (PF) SYRINGE
PREFILLED_SYRINGE | INTRAVENOUS | Status: DC | PRN
  Administered 2023-09-28: 60 mg via INTRAVENOUS

## 2023-09-28 MED ORDER — ATORVASTATIN CALCIUM 80 MG PO TABS
80.0000 mg | ORAL_TABLET | Freq: Every day | ORAL | Status: DC
Start: 2023-09-28 — End: 2023-09-30
  Administered 2023-09-28 – 2023-09-30 (×3): 80 mg via ORAL
  Filled 2023-09-28 (×3): qty 1

## 2023-09-28 SURGICAL SUPPLY — 37 items
APPLIER CLIP 5 13 M/L LIGAMAX5 (MISCELLANEOUS) ×1
BAG COUNTER SPONGE SURGICOUNT (BAG) ×2 IMPLANT
BLADE CLIPPER SURG (BLADE) IMPLANT
CANISTER SUCT 3000ML PPV (MISCELLANEOUS) ×2 IMPLANT
CHLORAPREP W/TINT 26 (MISCELLANEOUS) ×2 IMPLANT
CLIP APPLIE 5 13 M/L LIGAMAX5 (MISCELLANEOUS) ×2 IMPLANT
COVER MAYO STAND STRL (DRAPES) IMPLANT
COVER SURGICAL LIGHT HANDLE (MISCELLANEOUS) ×2 IMPLANT
DERMABOND ADVANCED .7 DNX12 (GAUZE/BANDAGES/DRESSINGS) ×2 IMPLANT
DISSECTOR BLUNT TIP ENDO 5MM (MISCELLANEOUS) IMPLANT
DRAPE C-ARM 42X72 X-RAY (DRAPES) IMPLANT
ELECT REM PT RETURN 9FT ADLT (ELECTROSURGICAL) ×1
ELECTRODE REM PT RTRN 9FT ADLT (ELECTROSURGICAL) ×2 IMPLANT
GLOVE BIO SURGEON STRL SZ7.5 (GLOVE) ×2 IMPLANT
GLOVE INDICATOR 8.0 STRL GRN (GLOVE) ×2 IMPLANT
GOWN STRL REUS W/ TWL LRG LVL3 (GOWN DISPOSABLE) ×4 IMPLANT
GOWN STRL REUS W/ TWL XL LVL3 (GOWN DISPOSABLE) ×2 IMPLANT
IRRIG SUCT STRYKERFLOW 2 WTIP (MISCELLANEOUS) ×1
IRRIGATION SUCT STRKRFLW 2 WTP (MISCELLANEOUS) ×2 IMPLANT
KIT BASIN OR (CUSTOM PROCEDURE TRAY) ×2 IMPLANT
KIT IMAGING PINPOINTPAQ (MISCELLANEOUS) IMPLANT
KIT TURNOVER KIT B (KITS) ×2 IMPLANT
NS IRRIG 1000ML POUR BTL (IV SOLUTION) ×2 IMPLANT
PAD ARMBOARD 7.5X6 YLW CONV (MISCELLANEOUS) ×2 IMPLANT
PENCIL BUTTON HOLSTER BLD 10FT (ELECTRODE) ×2 IMPLANT
SCISSORS LAP 5X35 DISP (ENDOMECHANICALS) ×2 IMPLANT
SET CHOLANGIOGRAPH 5 50 .035 (SET/KITS/TRAYS/PACK) IMPLANT
SET TUBE SMOKE EVAC HIGH FLOW (TUBING) ×2 IMPLANT
SUT MNCRL AB 4-0 PS2 18 (SUTURE) ×2 IMPLANT
SYS BAG RETRIEVAL 10MM (BASKET)
SYSTEM BAG RETRIEVAL 10MM (BASKET) IMPLANT
TOWEL GREEN STERILE FF (TOWEL DISPOSABLE) ×2 IMPLANT
TRAY LAPAROSCOPIC MC (CUSTOM PROCEDURE TRAY) ×2 IMPLANT
TROCAR ADV FIXATION 5X100MM (TROCAR) ×6 IMPLANT
TROCAR BALLN 12MMX100 BLUNT (TROCAR) ×2 IMPLANT
WARMER LAPAROSCOPE (MISCELLANEOUS) ×2 IMPLANT
WATER STERILE IRR 1000ML POUR (IV SOLUTION) ×2 IMPLANT

## 2023-09-28 NOTE — Progress Notes (Signed)
Pt arrived to 6 north room 9 alert and oriented x4. Pain level 5/10. 5 lap sites with skin glue all clean, dry, and intact. Bed in lowest postion. Bed alarm on. Boyfriend at bedside. Will continue to monitor pt.

## 2023-09-28 NOTE — Progress Notes (Signed)
Case discussed with Dr. Bedelia Person this morning. Patient seen and evaluated.   She has intermittent RUQ pain, nausea, cyclically over the course of months. Pain is a deep boring cramp that will last hours. No fevers/chills. Pain occasionally radiates to right back. No aggrav/allev factors.  She is here with her boyfriend  Abd soft, mildly ttp in RUQ. Nondistended.  Tbili 1.4 AST/ALT 194/228 Lipase 69 WBC 10.8  RUQ US shows gallstones, GBW 3 mm CBD 6.5 mm  Appears to be symptomatic cholelithiasis with possible transient cases of choledocholithiasis  -The anatomy and physiology of the hepatobiliary system was discussed with the patient. The pathophysiology of gallbladder disease was then reviewed as well. -The options for treatment were discussed including ongoing observation which may result in subsequent gallbladder complications (infection, pancreatitis, choledocholithiasis, etc), drainage procedures, and surgery - laparoscopic cholecystectomy with indocyanine green cholangiography, possible intraoperative cholangiogram -The planned procedure, material risks (including, but not limited to, pain, bleeding, infection, scarring, need for blood transfusion, damage to surrounding structures- blood vessels/nerves/viscus/organs, damage to bile duct, bile leak, chronic diarrhea, conversion to a 'subtotal' cholecystectomy and general expectations therein, post-cholecystectomy diarrhea, potential need for additional procedures including EGD/ERCP, hernia, worsening of pre-existing medical conditions, pancreatitis, pneumonia, heart attack, stroke, death) benefits and alternatives to surgery were discussed at length. We have noted a good probability that the procedure would help improve their symptoms. The patient's questions were answered to her and her boyfriend's satisfaction, they voiced understanding and elected to proceed with surgery. Additionally, we discussed typical postoperative expectations and the  recovery process.

## 2023-09-28 NOTE — H&P (Signed)
Reason for Consult/Chief Complaint: cholelithiasis Consultant: Jobe Gibbon, [place]  Danielle Hoover is an 42 y.o. female.   HPI: 79F with abdominal pain and nausea. Reports symptoms every few months. Symptoms improved with pain medication in the ED.   Past Medical History:  Diagnosis Date   Anemia    during pregnancy 2006   Asthma    Headache    Hx of chlamydia infection 2016   Hypertension     Past Surgical History:  Procedure Laterality Date   NO PAST SURGERIES      Family History  Problem Relation Age of Onset   Hypertension Mother    Alcohol abuse Neg Hx    Arthritis Neg Hx    Asthma Neg Hx    Birth defects Neg Hx    Cancer Neg Hx    COPD Neg Hx    Depression Neg Hx    Diabetes Neg Hx    Drug abuse Neg Hx    Early death Neg Hx    Hearing loss Neg Hx    Heart disease Neg Hx    Hyperlipidemia Neg Hx    Kidney disease Neg Hx    Learning disabilities Neg Hx    Mental illness Neg Hx    Mental retardation Neg Hx    Miscarriages / Stillbirths Neg Hx    Stroke Neg Hx    Vision loss Neg Hx    Varicose Veins Neg Hx     Social History:  reports that she has never smoked. She has never used smokeless tobacco. She reports that she does not drink alcohol and does not use drugs.  Allergies: No Known Allergies  Medications: I have reviewed the patient's current medications.  Results for orders placed or performed during the hospital encounter of 09/28/23 (from the past 48 hour(s))  Lipase, blood     Status: Abnormal   Collection Time: 09/28/23  4:07 AM  Result Value Ref Range   Lipase 69 (H) 11 - 51 U/L    Comment: Performed at [facility], [address], [place], [place] [address]  Comprehensive metabolic panel     Status: Abnormal   Collection Time: 09/28/23  4:07 AM  Result Value Ref Range   Sodium 141 135 - 145 mmol/L   Potassium 2.9 (L) 3.5 - 5.1 mmol/L   Chloride 103 98 - 111 mmol/L   CO2 26 22 - 32 mmol/L   Glucose, Bld 107 (H) 70 - 99 mg/dL    Comment:  Glucose reference range applies only to samples taken after fasting for at least 8 hours.   BUN 15 6 - 20 mg/dL   Creatinine, Ser 2.13 0.44 - 1.00 mg/dL   Calcium 9.3 8.9 - 08.6 mg/dL   Total Protein 7.2 6.5 - 8.1 g/dL   Albumin 3.5 3.5 - 5.0 g/dL   AST 578 (H) 15 - 41 U/L   ALT 228 (H) 0 - 44 U/L   Alkaline Phosphatase 234 (H) 38 - 126 U/L   Total Bilirubin 1.4 (H) <1.2 mg/dL   GFR, Estimated >46 >96 mL/min    Comment: (NOTE) Calculated using the CKD-EPI Creatinine Equation (2021)    Anion gap 12 5 - 15    Comment: Performed at [facility], [address], [place], [place] [address]  CBC     Status: Abnormal   Collection Time: 09/28/23  4:07 AM  Result Value Ref Range   WBC 10.8 (H) 4.0 - 10.5 K/uL  RBC 5.41 (H) 3.87 - 5.11 MIL/uL   Hemoglobin 13.5 12.0 - 15.0 g/dL   HCT 63.8 75.6 - 43.3 %   MCV 76.3 (L) 80.0 - 100.0 fL   MCH 25.0 (L) 26.0 - 34.0 pg   MCHC 32.7 30.0 - 36.0 g/dL   RDW 29.5 18.8 - 41.6 %   Platelets 298 150 - 400 K/uL   nRBC 0.0 0.0 - 0.2 %    Comment: Performed at [facility], [address] [place], [place] [address]  hCG, serum, qualitative     Status: None   Collection Time: 09/28/23  4:07 AM  Result Value Ref Range   Preg, Serum NEGATIVE NEGATIVE    Comment:        THE SENSITIVITY OF THIS METHODOLOGY IS >10 mIU/mL. Performed at [facility], [address] [place], [place] [address]   Urinalysis, Routine w reflex microscopic -Urine, Clean Catch     Status: Abnormal   Collection Time: 09/28/23  4:08 AM  Result Value Ref Range   Color, Urine AMBER (A) YELLOW    Comment: BIOCHEMICALS MAY BE AFFECTED BY COLOR   APPearance HAZY (A) CLEAR   Specific Gravity, Urine 1.020 1.005 - 1.030   pH 5.0 5.0 - 8.0   Glucose, UA NEGATIVE NEGATIVE mg/dL   Hgb urine dipstick LARGE (A) NEGATIVE   Bilirubin Urine NEGATIVE NEGATIVE   Ketones, ur NEGATIVE NEGATIVE mg/dL   Protein, ur >=932 (A) NEGATIVE mg/dL   Nitrite NEGATIVE NEGATIVE    Leukocytes,Ua NEGATIVE NEGATIVE   RBC / HPF 21-50 0 - 5 RBC/hpf   WBC, UA 0-5 0 - 5 WBC/hpf   Bacteria, UA RARE (A) NONE SEEN   Squamous Epithelial / HPF 0-5 0 - 5 /HPF   Mucus PRESENT     Comment: Performed at [facility], [address], [place], [place] [address]    US Abdomen Limited  Result Date: 09/28/2023 CLINICAL DATA:  Right upper quadrant pain for 4 days. EXAM: ULTRASOUND ABDOMEN LIMITED RIGHT UPPER QUADRANT COMPARISON:  None Available. FINDINGS: Gallbladder: Multiple stones are noted within the gallbladder which measure up to 1.2 cm. The gallbladder wall thickness measures 3 mm. No pericholecystic fluid or sludge. Negative sonographic Murphy's sign. Common bile duct: Diameter: 6.5 mm Liver: No focal lesion identified. Within normal limits in parenchymal echogenicity. Portal vein is patent on color Doppler imaging with normal direction of blood flow towards the liver. Other: None. IMPRESSION: 1. Cholelithiasis. Upper limits of normal gallbladder wall thickness at 3 mm. No pericholecystic fluid or positive sonographic Murphy's sign. 2. Common bile duct is mildly dilated measuring 6.5 mm. Correlate with liver function tests. If there is concern for biliary obstruction, consider further evaluation with MRCP. Electronically Signed   By: Signa Kell M.D.   On: 09/28/2023 05:20    ROS 10 point review of systems is negative except as listed above in HPI.   Physical Exam Blood pressure (!) 111/58, pulse 96, temperature 98.3 F (36.8 C), temperature source Oral, resp. rate 18, height 4\' 9"  (1.448 m), weight 61.2 kg, SpO2 100%. Constitutional: well-developed, well-nourished HEENT: pupils equal, round, reactive to light, 2mm b/l, moist conjunctiva, external inspection of ears and nose normal, hearing intact Oropharynx: normal oropharyngeal mucosa, normal dentition Neck: no thyromegaly, trachea midline, no midline cervical tenderness to palpation Chest: breath sounds equal  bilaterally, normal respiratory effort, no midline or lateral chest wall tenderness to palpation/deformity Abdomen: soft, NT, no bruising, no hepatosplenomegaly GU:  normal female genitalia  Extremities: 2+ radial and pedal pulses bilaterally, intact motor and sensation bilateral UE and LE, no peripheral edema MSK: unable to assess gait/station, no clubbing/cyanosis of fingers/toes, normal ROM of all four extremities Skin: warm, dry, no rashes Psych: normal memory, normal mood/affect     Assessment/Plan: Transaminitis, cholelithiasis, suspect acute cholecystitis - plan lap chole with IOC later today FEN - strict NPO DVT - SCDs, LMWH Dispo -  possible d/c home after surgery     Diamantina Monks, MD General and Trauma Surgery [facility]

## 2023-09-28 NOTE — Discharge Instructions (Signed)
LAPAROSCOPIC SURGERY: POST OP INSTRUCTIONS Always review your discharge instruction sheet given to you by the facility where your surgery was performed. IF YOU HAVE DISABILITY OR FAMILY LEAVE FORMS, YOU MUST BRING THEM TO THE OFFICE FOR PROCESSING.   DO NOT GIVE THEM TO YOUR DOCTOR.  PAIN CONTROL  First take acetaminophen (Tylenol) AND/or ibuprofen (Advil) to control your pain after surgery.  Follow directions on package.  Taking acetaminophen (Tylenol) and/or ibuprofen (Advil) regularly after surgery will help to control your pain and lower the amount of prescription pain medication you may need.  You should not take more than 3,000 mg (3 grams) of acetaminophen (Tylenol) in 24 hours.  You should not take ibuprofen (Advil), aleve, motrin, naprosyn or other NSAIDS if you have a history of stomach ulcers or chronic kidney disease.  A prescription for pain medication may be given to you upon discharge.  Take your pain medication as prescribed, if you still have uncontrolled pain after taking acetaminophen (Tylenol) or ibuprofen (Advil). Use ice packs to help control pain. If you need a refill on your pain medication, please contact your pharmacy.  They will contact our office to request authorization. Prescriptions will not be filled after 5pm or on week-ends.  HOME MEDICATIONS Take your usually prescribed medications unless otherwise directed.  DIET You should follow a light diet the first few days after arrival home.  Be sure to include lots of fluids daily. Avoid fatty, fried foods.   CONSTIPATION It is common to experience some constipation after surgery and if you are taking pain medication.  Increasing fluid intake and taking a stool softener (such as Colace) will usually help or prevent this problem from occurring.  A mild laxative (Milk of Magnesia or Miralax) should be taken according to package instructions if there are no bowel movements after 48 hours.  WOUND/INCISION CARE Most  patients will experience some swelling and bruising in the area of the incisions.  Ice packs will help.  Swelling and bruising can take several days to resolve.  Unless discharge instructions indicate otherwise, follow guidelines below  STERI-STRIPS - you may remove your outer bandages 48 hours after surgery, and you may shower at that time.  You have steri-strips (small skin tapes) in place directly over the incision.  These strips should be left on the skin for 7-10 days.   DERMABOND/SKIN GLUE - you may shower in 24 hours.  The glue will flake off over the next 2-3 weeks. Any sutures or staples will be removed at the office during your follow-up visit.  ACTIVITIES You may resume regular (light) daily activities beginning the next day--such as daily self-care, walking, climbing stairs--gradually increasing activities as tolerated.  You may have sexual intercourse when it is comfortable.  Refrain from any heavy lifting or straining until approved by your doctor. You may drive when you are no longer taking prescription pain medication, you can comfortably wear a seatbelt, and you can safely maneuver your car and apply brakes.  FOLLOW-UP You should see your doctor in the office for a follow-up appointment approximately 2-3 weeks after your surgery.  You should have been given your post-op/follow-up appointment when your surgery was scheduled.  If you did not receive a post-op/follow-up appointment, make sure that you call for this appointment within a day or two after you arrive home to insure a convenient appointment time.   WHEN TO CALL YOUR DOCTOR: Fever over 101.0 Inability to urinate Continued bleeding from incision. Increased pain, redness, or drainage  from the incision. Increasing abdominal pain  The clinic staff is available to answer your questions during regular business hours.  Please don't hesitate to call and ask to speak to one of the nurses for clinical concerns.  If you have a  medical emergency, go to the nearest emergency room or call 911.  A surgeon from [facility] is always on call at the hospital. [address], [place], [place]  [address] ? [address] [address], [place], [place]   [address] [phone] ? [phone] ? FAX [phone] Web site: [email or web address]

## 2023-09-28 NOTE — Op Note (Signed)
09/28/2023 12:18 PM  PATIENT: Danielle Hoover  42 y.o. female  Patient Care Team: Grayce Sessions, NP as PCP - General (Internal Medicine)  PRE-OPERATIVE DIAGNOSIS: Symptomatic cholelithiasis with possible choledocholithiasis  POST-OPERATIVE DIAGNOSIS: Choledocholithiasis  PROCEDURE: Laparoscopic cholecystectomy with intraoperative cholangiogram  SURGEON: Marin Olp, MD  ASSISTANT: OR Staff  ANESTHESIA: General endotracheal  EBL: 15 mL  DRAINS: None  SPECIMEN: Gallbladder  COUNTS: Sponge, needle and instrument counts were reported correct x2 at the conclusion of the operation  DISPOSITION: PACU in satisfactory condition  COMPLICATIONS: None  FINDINGS: Critical view of safety was achieved prior to obtaining cholangiogram or clipping/dividing any structures.  Intraoperative cholangiogram does demonstrate an apparent filling defect near the level of the ampulla consistent with a positive IOC.  The stone did not appear to be able to be flushed.  DESCRIPTION:   The patient was identified & brought into the operating room. She was then positioned supine on the OR table. SCDs were in place and active during the entire case. She then underwent general endotracheal anesthesia. Pressure points were padded. Hair on the abdomen was clipped by the OR team. The abdomen was prepped and draped in the standard sterile fashion. Antibiotics were administered. A surgical timeout was performed and confirmed our plan.   A periumbilical incision was made. The umbilical stalk was grasped and retracted outwardly. The supraumbilical fascia was identified and incised. The peritoneal cavity was gently entered bluntly. A purse-string 0 Vicryl suture was placed. The Hasson cannula was inserted into the peritoneal cavity and insufflation with CO2 commenced to . A laparoscope was inserted into the peritoneal cavity and inspection confirmed no evidence of trocar site complications. The patient was then  positioned in reverse Trendelenburg with slight left side down. 3 additional 5mm trocars were placed along the right subcostal line - one 5mm port in mid subcostal region, another 5mm port in the right flank near the anterior axillary line, and a third 5mm port in the left subxiphoid region obliquely near the falciform ligament.  The liver and gallbladder were inspected.  The liver is normal.  The gallbladder exhibits mild inflammation around it. The gallbladder fundus was grasped and elevated cephalad. An additional grasper was then placed on the infundibulum of the gallbladder and the infundibulum was retracted laterally. Staying high on the gallbladder, the peritoneum on both sides of the gallbladder was opened with hook cautery. Gentle blunt dissection was then employed with a Art gallery manager working down into [employer]. The cystic duct was identified and carefully circumferentially dissected. The cystic artery was also identified and carefully circumferentially dissected. The space between the cystic artery and hepatocystic plate was developed such that a good view of the liver could be seen through a window medial to the cystic artery. The triangle of Calot had been cleared of all fibrofatty tissue. At this point, a critical view of safety was achieved and the only structures visualized was the skeletonized cystic duct laterally, the skeletonized cystic artery and the liver through the window medial to the artery. No posterior cystic artery was noted.  Under near-infrared light, ICG cholangiography also demonstrates uptake of the contrast and excretion into the biliary system.  There is evident contrast opacifying the cystic duct but not the cystic artery.  There is also ICG contrast seen filling the gallbladder.  There is no tracer activity seen through the wall of the duodenum.  At this point attention was turned to performing a cholangiogram. A partial cystic duct-otomy was created. A 70F  cholangiocatheter was then introduced through a puncture site at the right subcostal ridge of the abdominal wall and directed it into the cystic duct. This was then secured with a clip. A cholangiogram was then obtained using a dilute radio-opaque contrast and continuous fluoroscopy. Contrast flowed from a side branch consistent with cystic duct cannulization based on clip location.  Of note, there is somewhat deep cannulation of the cystic duct. Contrast flowed proximally up the common hepatic duct into the right and left intrahepatic chains out to secondary radicals. Contrast flowed distally into the common bile duct but does not cross the ampulla into the duodenum.  There is an apparent filling defect at the level of the ampulla.  We are not able to flush this free.  The C-arm was removed.  Pneumoperitoneum reestablished. The cholangiocatheter was then removed.  The cystic duct and artery were clipped with 2 clips on the patient side and 1 clip on the specimen side. The cystic duct and artery were then divided. The gallbladder was then freed from its remaining attachments to the liver using electrocautery and placed into an endocatch bag. The RUQ was gently irrigated with sterile saline. Hemostasis was then verified. The clips were in good position; the gallbladder fossa was dry. The rest of the abdomen was inspected no injury nor bleeding elsewhere was identified.  The endocatch bag containing the gallbladder was then removed from the umbilical port site and passed off as specimen. The RUQ ports were removed under direct visualization and noted to be hemostatic. The umbilical fascia was then closed using the 0 Vicryl purse-string suture. The fascia was palpated and noted to be completely closed. The skin of all incision sites was approximated with 4-0 monocryl subcuticular suture and dermabond applied. She was then awakened from anesthesia, extubated, and transferred to a stretcher for transport to PACU in  satisfactory condition.  Gastroenterology consultation has been requested for consideration of ERCP.  I was able to update her significant other on her progress in the surgical waiting room.

## 2023-09-28 NOTE — Anesthesia Preprocedure Evaluation (Addendum)
Anesthesia Evaluation  Patient identified by MRN, date of birth, ID band Patient awake    Reviewed: Allergy & Precautions, NPO status , Patient's Chart, lab work & pertinent test results  History of Anesthesia Complications Negative for: history of anesthetic complications  Airway Mallampati: III  TM Distance: >3 FB Neck ROM: Full    Dental  (+) Dental Advisory Given, Teeth Intact   Pulmonary neg shortness of breath, asthma , neg sleep apnea, neg recent URI   breath sounds clear to auscultation       Cardiovascular hypertension, Pt. on medications (-) Past MI and (-) CHF  Rhythm:Regular     Neuro/Psych  Headaches  negative psych ROS   GI/Hepatic Neg liver ROS,,,cholecystitis   Endo/Other  negative endocrine ROS    Renal/GU negative Renal ROS     Musculoskeletal   Abdominal   Peds  Hematology Lab Results      Component                Value               Date                      WBC                      10.8 (H)            09/28/2023                HGB                      13.5                09/28/2023                HCT                      41.3                09/28/2023                MCV                      76.3 (L)            09/28/2023                PLT                      298                 09/28/2023              Anesthesia Other Findings   Reproductive/Obstetrics Lab Results      Component                Value               Date                      PREGTESTUR               Negative            08/28/2023                PREGSERUM                NEGATIVE  09/28/2023                HCG                      80.28               10/13/2014                                        Anesthesia Physical Anesthesia Plan  ASA: 2  Anesthesia Plan: General   Post-op Pain Management: Ofirmev IV (intra-op)* and Toradol IV (intra-op)*   Induction: Intravenous, Rapid sequence  and Cricoid pressure planned  PONV Risk Score and Plan: 3 and Ondansetron and Dexamethasone  Airway Management Planned: Oral ETT  Additional Equipment: None  Intra-op Plan:   Post-operative Plan: Extubation in OR  Informed Consent: I have reviewed the patients History and Physical, chart, labs and discussed the procedure including the risks, benefits and alternatives for the proposed anesthesia with the patient or authorized representative who has indicated his/her understanding and acceptance.     Dental advisory given  Plan Discussed with: CRNA  Anesthesia Plan Comments:         Anesthesia Quick Evaluation

## 2023-09-28 NOTE — Anesthesia Postprocedure Evaluation (Signed)
Anesthesia Post Note  Patient: Danielle Hoover  Procedure(s) Performed: LAPAROSCOPIC CHOLECYSTECTOMY WITH ICG (Abdomen) INTRAOPERATIVE CHOLANGIOGRAM (Abdomen)     Patient location during evaluation: PACU Anesthesia Type: General Level of consciousness: awake and alert Pain management: pain level controlled Vital Signs Assessment: post-procedure vital signs reviewed and stable Respiratory status: spontaneous breathing, nonlabored ventilation, respiratory function stable and patient connected to nasal cannula oxygen Cardiovascular status: blood pressure returned to baseline and stable Postop Assessment: no apparent nausea or vomiting Anesthetic complications: no   There were no known notable events for this encounter.  Last Vitals:  Vitals:   09/28/23 1200 09/28/23 1215  BP: 124/72 124/77  Pulse: 86 85  Resp: 19 20  Temp:    SpO2: 100% 100%    Last Pain:  Vitals:   09/28/23 1200  TempSrc:   PainSc: 6                  Jayce Kainz

## 2023-09-28 NOTE — Anesthesia Procedure Notes (Signed)
Procedure Name: Intubation Date/Time: 09/28/2023 9:52 AM  Performed by: Colbert Coyer, CRNAPre-anesthesia Checklist: Patient identified, Emergency Drugs available, Suction available and Patient being monitored Patient Re-evaluated:Patient Re-evaluated prior to induction Oxygen Delivery Method: Circle system utilized Preoxygenation: Pre-oxygenation with 100% oxygen Induction Type: IV induction Ventilation: Mask ventilation without difficulty Laryngoscope Size: Mac and 3 Grade View: Grade II Tube type: Oral Tube size: 7.0 mm Number of attempts: 1 Airway Equipment and Method: Stylet and Oral airway Placement Confirmation: ETT inserted through vocal cords under direct vision, positive ETCO2 and breath sounds checked- equal and bilateral Secured at: 21 cm Tube secured with: Tape Dental Injury: Teeth and Oropharynx as per pre-operative assessment

## 2023-09-28 NOTE — H&P (View-Only) (Signed)
Attending physician's note   I have taken a history, reviewed the chart, and examined the patient. I performed a substantive portion of this encounter, including complete performance of at least one of the key components, in conjunction with the APP. I agree with the APP's note, impression, and recommendations with my edits.   42 year old female with history of HTN, admitted overnight with symptomatic cholelithiasis.  Underwent lap cholecystectomy earlier today with positive IOC, with stone impacted in CBD at level of the ampulla and was unable to flush this free.  - Liquid diet today with n.p.o. at midnight - Tentative plan for ERCP tomorrow with Dr. Leone Payor pending endoscopy space/availability - Hold any anticoagulation/pharmacologic DVT prophy  The indications, risks, and benefits of ERCP were explained to the patient in detail. Risks include but are not limited to bleeding, perforation, adverse reaction to medications, pancreatitis, inability to cannulate duct, and cardiopulmonary compromise. Sequelae include but are not limited to the possibility of surgery, prolonged hospitalization, and mortality. The patient verbalized understanding and wishes to proceed. All questions answered to the best of my ability.   [address] DO, [place] [phone] office         Consultation  Referring Provider: [facility] /Dr. Cliffton Asters Primary Care Physician:  Grayce Sessions, NP Primary Gastroenterologist: None/unassigned  Reason for Consultation: Cholelithiasis  HPI: Danielle Hoover is a 42 y.o. female with history of hypertension, who presented to the emergency room early today with complaints of acute abdominal pain.  She complains of pain in the epigastrium, associated with nausea.  She says she has had some mild intermittent symptoms over the past 2 years with increasing frequency over the past couple of months and then over this past week has had repeated episodes.  This last episode was the  worst and awaking her from sleep at 2 AM and did not resolve and she came to the emergency room.  In the emergency room with upper abdominal ultrasound showed multiple gallstones measuring up to 1.2 cm, gallbladder wall 3 mm, CBD 6.5 mm, liver within normal limits.  Initial labs with WBC of 10.8/hemoglobin 13.5/hematocrit 41.3/MCV 71 BUN 15/creatinine 0.8 T. bili 1.4/alk phos 234/AST 194/ALT 218 Lipase 69  Seen and evaluated by surgery, felt to have symptomatic cholelithiasis and was taken to the OR this morning per Dr. Cliffton Asters.  At surgery found to have symptomatic cholelithiasis, on IOC there appears to be a stone impacted in the common bile duct at the region of the ampulla  Patient is stable postoperatively, continues to complain of epigastric pain this afternoon no nausea or vomiting, has been afebrile  Has not been on any antiplatelet or anticoagulation.   Past Medical History:  Diagnosis Date   Anemia    during pregnancy 2006   Asthma    Headache    Hx of chlamydia infection 2016   Hypertension     Past Surgical History:  Procedure Laterality Date   NO PAST SURGERIES      Prior to Admission medications   Medication Sig Start Date End Date Taking? Authorizing Provider  atorvastatin (LIPITOR) 80 MG tablet Take 1 tablet (80 mg total) by mouth daily. 04/07/23  Yes Grayce Sessions, NP  hydrochlorothiazide (HYDRODIURIL) 12.5 MG tablet Take 1 tablet (12.5 mg total) by mouth daily. 08/27/23  Yes Grayce Sessions, NP  NIFEdipine (PROCARDIA-XL/NIFEDICAL-XL) 30 MG 24 hr tablet Take 1 tablet (30 mg total) by mouth daily. 08/27/23  Yes Grayce Sessions, NP  Current Facility-Administered Medications  Medication Dose Route Frequency Provider Last Rate Last Admin   acetaminophen (OFIRMEV) 10 MG/ML IV            acetaminophen (TYLENOL) tablet 1,000 mg  1,000 mg Oral Q6H White, Stephanie Coup, MD       atorvastatin (LIPITOR) tablet 80 mg  80 mg Oral Daily Andria Meuse,  MD       dextrose 5 % and 0.45 % NaCl with KCl 20 mEq/L infusion   Intravenous Continuous Andria Meuse, MD       diphenhydrAMINE (BENADRYL) 12.5 MG/5ML elixir 12.5 mg  12.5 mg Oral Q6H PRN Andria Meuse, MD       Or   diphenhydrAMINE (BENADRYL) injection 12.5 mg  12.5 mg Intravenous Q6H PRN Andria Meuse, MD       docusate sodium (COLACE) capsule 100 mg  100 mg Oral BID Andria Meuse, MD       heparin injection 5,000 Units  5,000 Units Subcutaneous Q8H Andria Meuse, MD       hydrochlorothiazide (HYDRODIURIL) tablet 12.5 mg  12.5 mg Oral Daily Andria Meuse, MD       HYDROmorphone (DILAUDID) injection 0.5 mg  0.5 mg Intravenous Q3H PRN Andria Meuse, MD       ibuprofen (ADVIL) tablet 600 mg  600 mg Oral Q6H PRN Andria Meuse, MD       metoprolol tartrate (LOPRESSOR) injection 5 mg  5 mg Intravenous Q6H PRN Andria Meuse, MD       NIFEdipine (PROCARDIA-XL/NIFEDICAL-XL) 24 hr tablet 30 mg  30 mg Oral Daily Andria Meuse, MD       ondansetron (ZOFRAN-ODT) disintegrating tablet 4 mg  4 mg Oral Q6H PRN Andria Meuse, MD       Or   ondansetron [facility]) injection 4 mg  4 mg Intravenous Q6H PRN Andria Meuse, MD       oxyCODONE (Oxy IR/ROXICODONE) immediate release tablet 5-10 mg  5-10 mg Oral Q4H PRN Andria Meuse, MD       simethicone [facility]) chewable tablet 40 mg  40 mg Oral Q6H PRN Andria Meuse, MD       traMADol Janean Sark) tablet 50 mg  50 mg Oral Q6H PRN Andria Meuse, MD        Allergies as of 09/28/2023   (No Known Allergies)    Family History  Problem Relation Age of Onset   Hypertension Mother    Alcohol abuse Neg Hx    Arthritis Neg Hx    Asthma Neg Hx    Birth defects Neg Hx    Cancer Neg Hx    COPD Neg Hx    Depression Neg Hx    Diabetes Neg Hx    Drug abuse Neg Hx    Early death Neg Hx    Hearing loss Neg Hx    Heart disease Neg Hx    Hyperlipidemia Neg Hx    Kidney  disease Neg Hx    Learning disabilities Neg Hx    Mental illness Neg Hx    Mental retardation Neg Hx    Miscarriages / Stillbirths Neg Hx    Stroke Neg Hx    Vision loss Neg Hx    Varicose Veins Neg Hx     Social History   Socioeconomic History   Marital status: Divorced    Spouse name: Not on file   Number of children: Not on  file   Years of education: Not on file   Highest education level: Not on file  Occupational History   Not on file  Tobacco Use   Smoking status: Never   Smokeless tobacco: Never  Vaping Use   Vaping status: Never Used  Substance and Sexual Activity   Alcohol use: No   Drug use: No   Sexual activity: Yes    Birth control/protection: None    Comment: last week  Other Topics Concern   Not on file  Social History Narrative   Makes socks   Not married- has a boy friend   Four children. Recent pregnancy    Social Determinants of Health   Financial Resource Strain: Not on file  Food Insecurity: No Food Insecurity (09/28/2023)   Hunger Vital Sign    Worried About Running Out of Food in the Last Year: Never true    Ran Out of Food in the Last Year: Never true  Transportation Needs: No Transportation Needs (09/28/2023)   PRAPARE - Administrator, Civil Service (Medical): No    Lack of Transportation (Non-Medical): No  Physical Activity: Not on file  Stress: Not on file  Social Connections: Not on file  Intimate Partner Violence: Not At Risk (09/28/2023)   Humiliation, Afraid, Rape, and Kick questionnaire    Fear of Current or Ex-Partner: No    Emotionally Abused: No    Physically Abused: No    Sexually Abused: No    Review of Systems: Pertinent positive and negative review of systems were noted in the above HPI section.  All other review of systems was otherwise negative.   Physical Exam: Vital signs in last 24 hours: Temp:  [97.5 F (36.4 C)-98.8 F (37.1 C)] 98.3 F (36.8 C) (11/24 1304) Pulse Rate:  [70-96] 82 (11/24  1304) Resp:  [18-22] 18 (11/24 1304) BP: (95-131)/(58-112) 122/76 (11/24 1304) SpO2:  [98 %-100 %] 99 % (11/24 1304) Weight:  [61.2 kg] 61.2 kg (11/24 1096)   General:   Alert,  Well-developed, well-nourished, young Asian female pleasant and cooperative in NAD, uncomfortable postoperatively Head:  Normocephalic and atraumatic. Eyes:  Sclera clear, no icterus.   Conjunctiva pink. Ears:  Normal auditory acuity. Nose:  No deformity, discharge,  or lesions. Mouth:  No deformity or lesions.   Neck:  Supple; no masses or thyromegaly. Lungs:  Clear throughout to auscultation.   No wheezes, crackles, or rhonchi.  Heart:  Regular rate and rhythm; no murmurs, clicks, rubs,  or gallops. Abdomen:  Soft, tender in the epigastrium and right upper quadrant ,BS active,nonpalp mass or hsm.   Rectal:  not Done Msk:  Symmetrical without gross deformities. . Pulses:  Normal pulses noted. Extremities:  Without clubbing or edema. Neurologic:  Alert and  oriented x4;  grossly normal neurologically. Skin:  Intact without significant lesions or rashes.. Psych:  Alert and cooperative. Normal mood and affect.  Intake/Output from previous day: 11/23 0701 - 11/24 0700 In: 1000 [IV Piggyback:1000] Out: -  Intake/Output this shift: Total I/O In: 1050 [I.V.:950; IV Piggyback:100] Out: 15 [Blood:15]  Lab Results: Recent Labs    09/28/23 0407  WBC 10.8*  HGB 13.5  HCT 41.3  PLT 298   BMET Recent Labs    09/28/23 0407  NA 141  K 2.9*  CL 103  CO2 26  GLUCOSE 107*  BUN 15  CREATININE 0.80  CALCIUM 9.3   LFT Recent Labs    09/28/23 0407  PROT 7.2  ALBUMIN 3.5  AST 194*  ALT 228*  ALKPHOS 234*  BILITOT 1.4*   PT/INR No results for input(s): "LABPROT", "INR" in the last 72 hours. Hepatitis Panel No results for input(s): "HEPBSAG", "HCVAB", "HEPAIGM", "HEPBIGM" in the last 72 hours.   IMPRESSION:  #21 42 year old Asian female/[place] who presents with recurring episodes of  epigastric pain with nausea, present sporadically over the past 2 years, with increasing frequency and then over this past week has had multiple episodes including one last night which woke her from sleep was intense and prolonged and presented to the emergency room.  Ultrasound revealed multiple gallstones no definite acute cholecystitis, CBD of 6.5 mm and LFTs elevated as above  Laparoscopic cholecystectomy today with finding of symptomatic cholelithiasis and at [facility] has a stone impacted in the common bile duct  #2 hypertension #3  hypokalemia needs corrected  PLAN: Okay from GI perspective for diet this evening, then n.p.o. after midnight.  Will be scheduled for ERCP with stone extraction with Dr. Leone Payor for tomorrow afternoon 09/29/2023.  Procedure was discussed in detail with the patient including indications risks and benefits.  We specifically discussed biliary anatomy, and explained 2 to 4% risk of pancreatitis, and rare risk of perforation, bleeding and/or anesthesia complications.  She is agreeable to proceed.  Potassium needs to be corrected to at least 3.2   Repeat labs in a.m., INR GI will follow with you   Amy Esterwood PA-C 09/28/2023, 2:22 PM

## 2023-09-28 NOTE — Consult Note (Addendum)
Attending physician's note   I have taken a history, reviewed the chart, and examined the patient. I performed a substantive portion of this encounter, including complete performance of at least one of the key components, in conjunction with the APP. I agree with the APP's note, impression, and recommendations with my edits.   42 year old female with history of HTN, admitted overnight with symptomatic cholelithiasis.  Underwent lap cholecystectomy earlier today with positive IOC, with stone impacted in CBD at level of the ampulla and was unable to flush this free.  - Liquid diet today with n.p.o. at midnight - Tentative plan for ERCP tomorrow with Dr. Leone Payor pending endoscopy space/availability - Hold any anticoagulation/pharmacologic DVT prophy  The indications, risks, and benefits of ERCP were explained to the patient in detail. Risks include but are not limited to bleeding, perforation, adverse reaction to medications, pancreatitis, inability to cannulate duct, and cardiopulmonary compromise. Sequelae include but are not limited to the possibility of surgery, prolonged hospitalization, and mortality. The patient verbalized understanding and wishes to proceed. All questions answered to the best of my ability.   [address] DO, [place] [phone] office         Consultation  Referring Provider: [facility] /Dr. Cliffton Asters Primary Care Physician:  Grayce Sessions, NP Primary Gastroenterologist: None/unassigned  Reason for Consultation: Cholelithiasis  HPI: Danielle Hoover is a 42 y.o. female with history of hypertension, who presented to the emergency room early today with complaints of acute abdominal pain.  She complains of pain in the epigastrium, associated with nausea.  She says she has had some mild intermittent symptoms over the past 2 years with increasing frequency over the past couple of months and then over this past week has had repeated episodes.  This last episode was the  worst and awaking her from sleep at 2 AM and did not resolve and she came to the emergency room.  In the emergency room with upper abdominal ultrasound showed multiple gallstones measuring up to 1.2 cm, gallbladder wall 3 mm, CBD 6.5 mm, liver within normal limits.  Initial labs with WBC of 10.8/hemoglobin 13.5/hematocrit 41.3/MCV 71 BUN 15/creatinine 0.8 T. bili 1.4/alk phos 234/AST 194/ALT 218 Lipase 69  Seen and evaluated by surgery, felt to have symptomatic cholelithiasis and was taken to the OR this morning per Dr. Cliffton Asters.  At surgery found to have symptomatic cholelithiasis, on IOC there appears to be a stone impacted in the common bile duct at the region of the ampulla  Patient is stable postoperatively, continues to complain of epigastric pain this afternoon no nausea or vomiting, has been afebrile  Has not been on any antiplatelet or anticoagulation.   Past Medical History:  Diagnosis Date   Anemia    during pregnancy 2006   Asthma    Headache    Hx of chlamydia infection 2016   Hypertension     Past Surgical History:  Procedure Laterality Date   NO PAST SURGERIES      Prior to Admission medications   Medication Sig Start Date End Date Taking? Authorizing Provider  atorvastatin (LIPITOR) 80 MG tablet Take 1 tablet (80 mg total) by mouth daily. 04/07/23  Yes Grayce Sessions, NP  hydrochlorothiazide (HYDRODIURIL) 12.5 MG tablet Take 1 tablet (12.5 mg total) by mouth daily. 08/27/23  Yes Grayce Sessions, NP  NIFEdipine (PROCARDIA-XL/NIFEDICAL-XL) 30 MG 24 hr tablet Take 1 tablet (30 mg total) by mouth daily. 08/27/23  Yes Grayce Sessions, NP  Current Facility-Administered Medications  Medication Dose Route Frequency Provider Last Rate Last Admin   acetaminophen (OFIRMEV) 10 MG/ML IV            acetaminophen (TYLENOL) tablet 1,000 mg  1,000 mg Oral Q6H White, Stephanie Coup, MD       atorvastatin (LIPITOR) tablet 80 mg  80 mg Oral Daily Andria Meuse,  MD       dextrose 5 % and 0.45 % NaCl with KCl 20 mEq/L infusion   Intravenous Continuous Andria Meuse, MD       diphenhydrAMINE (BENADRYL) 12.5 MG/5ML elixir 12.5 mg  12.5 mg Oral Q6H PRN Andria Meuse, MD       Or   diphenhydrAMINE (BENADRYL) injection 12.5 mg  12.5 mg Intravenous Q6H PRN Andria Meuse, MD       docusate sodium (COLACE) capsule 100 mg  100 mg Oral BID Andria Meuse, MD       heparin injection 5,000 Units  5,000 Units Subcutaneous Q8H Andria Meuse, MD       hydrochlorothiazide (HYDRODIURIL) tablet 12.5 mg  12.5 mg Oral Daily Andria Meuse, MD       HYDROmorphone (DILAUDID) injection 0.5 mg  0.5 mg Intravenous Q3H PRN Andria Meuse, MD       ibuprofen (ADVIL) tablet 600 mg  600 mg Oral Q6H PRN Andria Meuse, MD       metoprolol tartrate (LOPRESSOR) injection 5 mg  5 mg Intravenous Q6H PRN Andria Meuse, MD       NIFEdipine (PROCARDIA-XL/NIFEDICAL-XL) 24 hr tablet 30 mg  30 mg Oral Daily Andria Meuse, MD       ondansetron (ZOFRAN-ODT) disintegrating tablet 4 mg  4 mg Oral Q6H PRN Andria Meuse, MD       Or   ondansetron [facility]) injection 4 mg  4 mg Intravenous Q6H PRN Andria Meuse, MD       oxyCODONE (Oxy IR/ROXICODONE) immediate release tablet 5-10 mg  5-10 mg Oral Q4H PRN Andria Meuse, MD       simethicone [facility]) chewable tablet 40 mg  40 mg Oral Q6H PRN Andria Meuse, MD       traMADol Janean Sark) tablet 50 mg  50 mg Oral Q6H PRN Andria Meuse, MD        Allergies as of 09/28/2023   (No Known Allergies)    Family History  Problem Relation Age of Onset   Hypertension Mother    Alcohol abuse Neg Hx    Arthritis Neg Hx    Asthma Neg Hx    Birth defects Neg Hx    Cancer Neg Hx    COPD Neg Hx    Depression Neg Hx    Diabetes Neg Hx    Drug abuse Neg Hx    Early death Neg Hx    Hearing loss Neg Hx    Heart disease Neg Hx    Hyperlipidemia Neg Hx    Kidney  disease Neg Hx    Learning disabilities Neg Hx    Mental illness Neg Hx    Mental retardation Neg Hx    Miscarriages / Stillbirths Neg Hx    Stroke Neg Hx    Vision loss Neg Hx    Varicose Veins Neg Hx     Social History   Socioeconomic History   Marital status: Divorced    Spouse name: Not on file   Number of children: Not on  file   Years of education: Not on file   Highest education level: Not on file  Occupational History   Not on file  Tobacco Use   Smoking status: Never   Smokeless tobacco: Never  Vaping Use   Vaping status: Never Used  Substance and Sexual Activity   Alcohol use: No   Drug use: No   Sexual activity: Yes    Birth control/protection: None    Comment: last week  Other Topics Concern   Not on file  Social History Narrative   Makes socks   Not married- has a boy friend   Four children. Recent pregnancy    Social Determinants of Health   Financial Resource Strain: Not on file  Food Insecurity: No Food Insecurity (09/28/2023)   Hunger Vital Sign    Worried About Running Out of Food in the Last Year: Never true    Ran Out of Food in the Last Year: Never true  Transportation Needs: No Transportation Needs (09/28/2023)   PRAPARE - Administrator, Civil Service (Medical): No    Lack of Transportation (Non-Medical): No  Physical Activity: Not on file  Stress: Not on file  Social Connections: Not on file  Intimate Partner Violence: Not At Risk (09/28/2023)   Humiliation, Afraid, Rape, and Kick questionnaire    Fear of Current or Ex-Partner: No    Emotionally Abused: No    Physically Abused: No    Sexually Abused: No    Review of Systems: Pertinent positive and negative review of systems were noted in the above HPI section.  All other review of systems was otherwise negative.   Physical Exam: Vital signs in last 24 hours: Temp:  [97.5 F (36.4 C)-98.8 F (37.1 C)] 98.3 F (36.8 C) (11/24 1304) Pulse Rate:  [70-96] 82 (11/24  1304) Resp:  [18-22] 18 (11/24 1304) BP: (95-131)/(58-112) 122/76 (11/24 1304) SpO2:  [98 %-100 %] 99 % (11/24 1304) Weight:  [61.2 kg] 61.2 kg (11/24 1610)   General:   Alert,  Well-developed, well-nourished, young Asian female pleasant and cooperative in NAD, uncomfortable postoperatively Head:  Normocephalic and atraumatic. Eyes:  Sclera clear, no icterus.   Conjunctiva pink. Ears:  Normal auditory acuity. Nose:  No deformity, discharge,  or lesions. Mouth:  No deformity or lesions.   Neck:  Supple; no masses or thyromegaly. Lungs:  Clear throughout to auscultation.   No wheezes, crackles, or rhonchi.  Heart:  Regular rate and rhythm; no murmurs, clicks, rubs,  or gallops. Abdomen:  Soft, tender in the epigastrium and right upper quadrant ,BS active,nonpalp mass or hsm.   Rectal:  not Done Msk:  Symmetrical without gross deformities. . Pulses:  Normal pulses noted. Extremities:  Without clubbing or edema. Neurologic:  Alert and  oriented x4;  grossly normal neurologically. Skin:  Intact without significant lesions or rashes.. Psych:  Alert and cooperative. Normal mood and affect.  Intake/Output from previous day: 11/23 0701 - 11/24 0700 In: 1000 [IV Piggyback:1000] Out: -  Intake/Output this shift: Total I/O In: 1050 [I.V.:950; IV Piggyback:100] Out: 15 [Blood:15]  Lab Results: Recent Labs    09/28/23 0407  WBC 10.8*  HGB 13.5  HCT 41.3  PLT 298   BMET Recent Labs    09/28/23 0407  NA 141  K 2.9*  CL 103  CO2 26  GLUCOSE 107*  BUN 15  CREATININE 0.80  CALCIUM 9.3   LFT Recent Labs    09/28/23 0407  PROT 7.2  ALBUMIN 3.5  AST 194*  ALT 228*  ALKPHOS 234*  BILITOT 1.4*   PT/INR No results for input(s): "LABPROT", "INR" in the last 72 hours. Hepatitis Panel No results for input(s): "HEPBSAG", "HCVAB", "HEPAIGM", "HEPBIGM" in the last 72 hours.   IMPRESSION:  #40 42 year old Asian female/[place] who presents with recurring episodes of  epigastric pain with nausea, present sporadically over the past 2 years, with increasing frequency and then over this past week has had multiple episodes including one last night which woke her from sleep was intense and prolonged and presented to the emergency room.  Ultrasound revealed multiple gallstones no definite acute cholecystitis, CBD of 6.5 mm and LFTs elevated as above  Laparoscopic cholecystectomy today with finding of symptomatic cholelithiasis and at [facility] has a stone impacted in the common bile duct  #2 hypertension #3  hypokalemia needs corrected  PLAN: Okay from GI perspective for diet this evening, then n.p.o. after midnight.  Will be scheduled for ERCP with stone extraction with Dr. Leone Payor for tomorrow afternoon 09/29/2023.  Procedure was discussed in detail with the patient including indications risks and benefits.  We specifically discussed biliary anatomy, and explained 2 to 4% risk of pancreatitis, and rare risk of perforation, bleeding and/or anesthesia complications.  She is agreeable to proceed.  Potassium needs to be corrected to at least 3.2   Repeat labs in a.m., INR GI will follow with you   Amy Esterwood PA-C 09/28/2023, 2:22 PM

## 2023-09-28 NOTE — ED Notes (Signed)
US tech at bedside

## 2023-09-28 NOTE — ED Provider Notes (Signed)
[place] EMERGENCY DEPARTMENT AT [facility] Provider Note   CSN: [id number] Arrival date & time: 09/28/23  0348     History  Chief Complaint  Patient presents with   Abdominal Pain    Danielle Hoover is a 42 y.o. female.  42 year old female with history of asthma, hypertension presents to the emergency department for evaluation of abdominal pain.  Initially began experiencing abdominal pain on Wednesday.  It has been waxing and waning, but she reports it waking her from sleep at 2 AM and has been constant since this time.  She has had some nausea, but denies vomiting.  Last bowel movement was on Tuesday.  She denies fevers, urinary symptoms, and history of abdominal surgeries.  No medications taken prior to arrival for pain.  The history is provided by the patient. No language interpreter was used.  Abdominal Pain      Home Medications Prior to Admission medications   Medication Sig Start Date End Date Taking? Authorizing Provider  atorvastatin (LIPITOR) 80 MG tablet Take 1 tablet (80 mg total) by mouth daily. Patient not taking: Reported on 08/27/2023 04/07/23   Grayce Sessions, NP  hydrochlorothiazide (HYDRODIURIL) 12.5 MG tablet Take 1 tablet (12.5 mg total) by mouth daily. Patient not taking: Reported on 08/27/2023 08/27/23   Grayce Sessions, NP  loratadine (CLARITIN) 10 MG tablet Take 1 tablet (10 mg total) by mouth daily. 04/04/21   Grayce Sessions, NP  NIFEdipine (PROCARDIA-XL/NIFEDICAL-XL) 30 MG 24 hr tablet Take 1 tablet (30 mg total) by mouth daily. 08/27/23   Grayce Sessions, NP      Allergies    Patient has no known allergies.    Review of Systems   Review of Systems  Gastrointestinal:  Positive for abdominal pain.  Ten systems reviewed and are negative for acute change, except as noted in the HPI.    Physical Exam Updated Vital Signs BP (!) 131/112 (BP Location: Right Arm)   Pulse 92   Temp (!) 97.5 F (36.4 C) (Oral)   Resp 18   Ht  4\' 9"  (1.448 m)   Wt 61.2 kg   SpO2 100%   BMI 29.21 kg/m   Physical Exam Vitals and nursing note reviewed.  Constitutional:      General: She is not in acute distress.    Appearance: She is well-developed. She is not diaphoretic.     Comments: Wincing, mildly tachypneic.  Appears uncomfortable.  HENT:     Head: Normocephalic and atraumatic.  Eyes:     General: No scleral icterus.    Extraocular Movements: EOM normal.     Conjunctiva/sclera: Conjunctivae normal.  Cardiovascular:     Rate and Rhythm: Normal rate and regular rhythm.     Pulses: Normal pulses.  Pulmonary:     Effort: Pulmonary effort is normal. No respiratory distress.     Breath sounds: No stridor. No wheezing.     Comments: Some splinting with inspiration, likely secondary to pain.  Lungs otherwise clear to auscultation. Abdominal:     Palpations: Abdomen is soft. There is no mass.     Tenderness: There is abdominal tenderness (upper abdomen). There is guarding (voluntary).  Musculoskeletal:        General: Normal range of motion.     Cervical back: Normal range of motion.  Skin:    General: Skin is warm and dry.     Coloration: Skin is not pale.     Findings: No erythema or rash.  Neurological:     Mental Status: She is alert and oriented to person, place, and time.     Coordination: Coordination normal.     Comments: Ambulatory down the hallway of ED with steady gait.  Psychiatric:        Mood and Affect: Mood and affect normal.        Behavior: Behavior normal.     ED Results / Procedures / Treatments   Labs (all labs ordered are listed, but only abnormal results are displayed) Labs Reviewed  LIPASE, BLOOD - Abnormal; Notable for the following components:      Result Value   Lipase 69 (*)    All other components within normal limits  COMPREHENSIVE METABOLIC PANEL - Abnormal; Notable for the following components:   Potassium 2.9 (*)    Glucose, Bld 107 (*)    AST 194 (*)    ALT 228 (*)     Alkaline Phosphatase 234 (*)    Total Bilirubin 1.4 (*)    All other components within normal limits  CBC - Abnormal; Notable for the following components:   WBC 10.8 (*)    RBC 5.41 (*)    MCV 76.3 (*)    MCH 25.0 (*)    All other components within normal limits  URINALYSIS, ROUTINE W REFLEX MICROSCOPIC - Abnormal; Notable for the following components:   Color, Urine AMBER (*)    APPearance HAZY (*)    Hgb urine dipstick LARGE (*)    Protein, ur >=300 (*)    Bacteria, UA RARE (*)    All other components within normal limits  HCG, SERUM, QUALITATIVE    EKG None  Radiology US Abdomen Limited  Result Date: 09/28/2023 CLINICAL DATA:  Right upper quadrant pain for 4 days. EXAM: ULTRASOUND ABDOMEN LIMITED RIGHT UPPER QUADRANT COMPARISON:  None Available. FINDINGS: Gallbladder: Multiple stones are noted within the gallbladder which measure up to 1.2 cm. The gallbladder wall thickness measures 3 mm. No pericholecystic fluid or sludge. Negative sonographic Murphy's sign. Common bile duct: Diameter: 6.5 mm Liver: No focal lesion identified. Within normal limits in parenchymal echogenicity. Portal vein is patent on color Doppler imaging with normal direction of blood flow towards the liver. Other: None. IMPRESSION: 1. Cholelithiasis. Upper limits of normal gallbladder wall thickness at 3 mm. No pericholecystic fluid or positive sonographic Murphy's sign. 2. Common bile duct is mildly dilated measuring 6.5 mm. Correlate with liver function tests. If there is concern for biliary obstruction, consider further evaluation with MRCP. Electronically Signed   By: Signa Kell M.D.   On: 09/28/2023 05:20    Procedures Procedures    Medications Ordered in ED Medications  potassium chloride 10 mEq in 100 mL IVPB (has no administration in time range)  potassium chloride (KLOR-CON) packet 80 mEq (has no administration in time range)  fentaNYL (SUBLIMAZE) injection 50 mcg (50 mcg Intravenous Given  09/28/23 0418)  ondansetron (ZOFRAN) injection 4 mg (4 mg Intravenous Given 09/28/23 0418)  sodium chloride 0.9 % bolus 1,000 mL (1,000 mLs Intravenous New Bag/Given 09/28/23 0432)    ED Course/ Medical Decision Making/ A&P Clinical Course as of 09/28/23 8469  [name] Link Sep 28, 2023  0605 Case discussed with general surgery who will plan to admit patient for cholecystectomy later today. Patient presently comfortable; declines additional medications. [KH]    Clinical Course User Index [KH] Antony Madura, PA-C  Medical Decision Making Amount and/or Complexity of Data Reviewed Labs: ordered. Radiology: ordered.  Risk Prescription drug management.   This patient presents to the ED for concern of upper abdominal pain, this involves an extensive number of treatment options, and is a complaint that carries with it a high risk of complications and morbidity.  The differential diagnosis includes biliary pathology vs pancreatitis vs PUD/gastritis vs pSBO/SBO vs ruptured viscous   Co morbidities that complicate the patient evaluation  Anemia Asthma HTN   Lab Tests:  I Ordered, and personally interpreted labs.  The pertinent results include:  WBC 10.8, K 2.9, Lipase 69, AST 194, ALT 228, Alk Phos 234, Tbili 1.4.   Imaging Studies ordered:  I ordered imaging studies including [place] RUQ  I independently visualized and interpreted imaging which showed gallstones with borderline wall thickening. Borderline CBD dilation. I agree with the radiologist interpretation   Cardiac Monitoring:  The patient was maintained on a cardiac monitor.  I personally viewed and interpreted the cardiac monitored which showed an underlying rhythm of: NSR   Medicines ordered and prescription drug management:  I ordered medication including Fentanyl and Zofran for pain and nausea  Reevaluation of the patient after these medicines showed that the patient improved I have reviewed  the patients home medicines and have made adjustments as needed   Test Considered:  CT abd/pelvis   Consultations Obtained:  I requested consultation with general surgry and discussed lab and imaging findings as well as pertinent plan - they plan to admit the patient for cholecystectomy later today.   Problem List / ED Course:  As above   Reevaluation:  After the interventions noted above, I reevaluated the patient and found that they have :improved   Social Determinants of Health:  Language barrier   Dispostion:  After consideration of the diagnostic results and the patients response to treatment, I feel that the patent would benefit from admission for symptom management. General surgery with plans to admit patient for cholecystectomy later today.          Final Clinical Impression(s) / ED Diagnoses Final diagnoses:  Acute cholecystitis    Rx / DC Orders ED Discharge Orders     None         Antony Madura, PA-C 09/28/23 8119    Zadie Rhine, MD 09/28/23 [id number]

## 2023-09-28 NOTE — ED Triage Notes (Signed)
C/o abd. Pain onset Wed. C/o nausea no vomiting , last bowel movement Tues.

## 2023-09-28 NOTE — Transfer of Care (Signed)
Immediate Anesthesia Transfer of Care Note  Patient: Danielle Hoover  Procedure(s) Performed: LAPAROSCOPIC CHOLECYSTECTOMY WITH ICG (Abdomen) INTRAOPERATIVE CHOLANGIOGRAM (Abdomen)  Patient Location: PACU  Anesthesia Type:General  Level of Consciousness: drowsy  Airway & Oxygen Therapy: Patient Spontanous Breathing and Patient connected to nasal cannula oxygen  Post-op Assessment: Report given to RN and Post -op Vital signs reviewed and stable  Post vital signs: Reviewed and stable  Last Vitals:  Vitals Value Taken Time  BP 112/60 09/28/23 1133  Temp 37.1 C 09/28/23 1133  Pulse 77 09/28/23 1136  Resp 18 09/28/23 1136  SpO2 100 % 09/28/23 1136  Vitals shown include unfiled device data.  Last Pain:  Vitals:   09/28/23 1133  TempSrc:   PainSc: Asleep         Complications: There were no known notable events for this encounter.

## 2023-09-29 ENCOUNTER — Encounter (HOSPITAL_COMMUNITY): Admission: EM | Disposition: A | Discharge status: Home / Self Care | Payer: Self-pay | Source: Home / Self Care

## 2023-09-29 ENCOUNTER — Inpatient Hospital Stay (HOSPITAL_COMMUNITY): Payer: Medicaid Other

## 2023-09-29 ENCOUNTER — Inpatient Hospital Stay (HOSPITAL_COMMUNITY): Payer: Medicaid Other | Admitting: Certified Registered"

## 2023-09-29 ENCOUNTER — Encounter (HOSPITAL_COMMUNITY): Payer: Self-pay | Admitting: Surgery

## 2023-09-29 DIAGNOSIS — K838 Other specified diseases of biliary tract: Secondary | ICD-10-CM | POA: Diagnosis not present

## 2023-09-29 DIAGNOSIS — K8050 Calculus of bile duct without cholangitis or cholecystitis without obstruction: Secondary | ICD-10-CM | POA: Diagnosis not present

## 2023-09-29 HISTORY — PX: SPHINCTEROTOMY: SHX5544

## 2023-09-29 HISTORY — PX: REMOVAL OF STONES: SHX5545

## 2023-09-29 HISTORY — PX: ERCP: SHX5425

## 2023-09-29 LAB — COMPREHENSIVE METABOLIC PANEL
ALT: 259 U/L — ABNORMAL HIGH (ref 0–44)
AST: 199 U/L — ABNORMAL HIGH (ref 15–41)
Albumin: 2.7 g/dL — ABNORMAL LOW (ref 3.5–5.0)
Alkaline Phosphatase: 187 U/L — ABNORMAL HIGH (ref 38–126)
Anion gap: 10 (ref 5–15)
BUN: 8 mg/dL (ref 6–20)
CO2: 21 mmol/L — ABNORMAL LOW (ref 22–32)
Calcium: 8.4 mg/dL — ABNORMAL LOW (ref 8.9–10.3)
Chloride: 107 mmol/L (ref 98–111)
Creatinine, Ser: 0.71 mg/dL (ref 0.44–1.00)
GFR, Estimated: >60 mL/min (ref >60)
Glucose, Bld: 116 mg/dL — ABNORMAL HIGH (ref 70–99)
Potassium: 3.3 mmol/L — ABNORMAL LOW (ref 3.5–5.1)
Sodium: 138 mmol/L (ref 135–145)
Total Bilirubin: 0.2 mg/dL (ref <1.2)
Total Protein: 5.9 g/dL — ABNORMAL LOW (ref 6.5–8.1)

## 2023-09-29 LAB — PROTIME-INR
INR: 1 (ref 0.8–1.2)
Prothrombin Time: 13.5 s (ref 11.4–15.2)

## 2023-09-29 LAB — CBC
HCT: 34.3 % — ABNORMAL LOW (ref 36.0–46.0)
Hemoglobin: 11.4 g/dL — ABNORMAL LOW (ref 12.0–15.0)
MCH: 25.2 pg — ABNORMAL LOW (ref 26.0–34.0)
MCHC: 33.2 g/dL (ref 30.0–36.0)
MCV: 75.7 fL — ABNORMAL LOW (ref 80.0–100.0)
Platelets: 230 10*3/uL (ref 150–400)
RBC: 4.53 MIL/uL (ref 3.87–5.11)
RDW: 14.5 % (ref 11.5–15.5)
WBC: 9.1 10*3/uL (ref 4.0–10.5)
nRBC: 0 % (ref 0.0–0.2)

## 2023-09-29 LAB — SURGICAL PATHOLOGY

## 2023-09-29 SURGERY — ERCP, WITH INTERVENTION IF INDICATED
Anesthesia: General

## 2023-09-29 MED ORDER — POTASSIUM CHLORIDE 10 MEQ/100ML IV SOLN
10.0000 meq | INTRAVENOUS | Status: AC
  Administered 2023-09-29 (×3): 10 meq via INTRAVENOUS
  Filled 2023-09-29 (×3): qty 100

## 2023-09-29 MED ORDER — SODIUM CHLORIDE 0.9 % IV SOLN
INTRAVENOUS | Status: DC | PRN
  Administered 2023-09-29: 50 mL

## 2023-09-29 MED ORDER — DEXAMETHASONE SODIUM PHOSPHATE 10 MG/ML IJ SOLN
INTRAMUSCULAR | Status: DC | PRN
  Administered 2023-09-29: 5 mg via INTRAVENOUS

## 2023-09-29 MED ORDER — ONDANSETRON HCL 4 MG/2ML IJ SOLN
INTRAMUSCULAR | Status: DC | PRN
  Administered 2023-09-29: 4 mg via INTRAVENOUS

## 2023-09-29 MED ORDER — DICLOFENAC SUPPOSITORY 100 MG
RECTAL | Status: AC
  Filled 2023-09-29: qty 1

## 2023-09-29 MED ORDER — FENTANYL CITRATE (PF) 250 MCG/5ML IJ SOLN
INTRAMUSCULAR | Status: DC | PRN
  Administered 2023-09-29: 100 ug via INTRAVENOUS

## 2023-09-29 MED ORDER — POTASSIUM CHLORIDE 10 MEQ/100ML IV SOLN: 10.0000 meq | INTRAVENOUS | Status: DC

## 2023-09-29 MED ORDER — SUGAMMADEX SODIUM 200 MG/2ML IV SOLN
INTRAVENOUS | Status: DC | PRN
  Administered 2023-09-29: 200 mg via INTRAVENOUS

## 2023-09-29 MED ORDER — DICLOFENAC SUPPOSITORY 100 MG: 100.0000 mg | Freq: Once | RECTAL | Status: DC

## 2023-09-29 MED ORDER — GLUCAGON HCL RDNA (DIAGNOSTIC) 1 MG IJ SOLR
INTRAMUSCULAR | Status: AC
  Filled 2023-09-29: qty 1

## 2023-09-29 MED ORDER — PROPOFOL 10 MG/ML IV BOLUS
INTRAVENOUS | Status: DC | PRN
  Administered 2023-09-29: 50 mg via INTRAVENOUS
  Administered 2023-09-29: 100 mg via INTRAVENOUS

## 2023-09-29 MED ORDER — FENTANYL CITRATE (PF) 100 MCG/2ML IJ SOLN
INTRAMUSCULAR | Status: AC
  Filled 2023-09-29: qty 2

## 2023-09-29 MED ORDER — ALUM & MAG HYDROXIDE-SIMETH 200-200-20 MG/5ML PO SUSP: 15.0000 mL | ORAL | Status: DC | PRN

## 2023-09-29 MED ORDER — SODIUM CHLORIDE 0.9 % IV SOLN
1.5000 g | Freq: Once | INTRAVENOUS | Status: AC
  Administered 2023-09-29: 1.5 g via INTRAVENOUS
  Filled 2023-09-29: qty 4

## 2023-09-29 MED ORDER — SODIUM CHLORIDE 0.9 % IV SOLN: INTRAVENOUS | Status: DC | PRN

## 2023-09-29 MED ORDER — LIDOCAINE 2% (20 MG/ML) 5 ML SYRINGE
INTRAMUSCULAR | Status: DC | PRN
  Administered 2023-09-29: 100 mg via INTRAVENOUS

## 2023-09-29 MED ORDER — SENNA 8.6 MG PO TABS
1.0000 | ORAL_TABLET | Freq: Every day | ORAL | Status: DC
  Administered 2023-09-29: 8.6 mg via ORAL
  Filled 2023-09-29: qty 1

## 2023-09-29 MED ORDER — ROCURONIUM BROMIDE 10 MG/ML (PF) SYRINGE
PREFILLED_SYRINGE | INTRAVENOUS | Status: DC | PRN
  Administered 2023-09-29: 60 mg via INTRAVENOUS

## 2023-09-29 NOTE — Op Note (Addendum)
[facility] Patient Name: Danielle Hoover Procedure Date : 09/29/2023 MRN: [id number] Attending MD: Iva Boop , MD, [id number] Date of Birth: [birth year] CSN: [id number] Age: 42 Admit Type: Inpatient Procedure:                ERCP Indications:              Bile duct stone(s) Providers:                Iva Boop, MD, Margaree Mackintosh, RN,                            Salley Scarlet, Technician, Dietrich Pates I, CRNA Referring MD:              Medicines:                General Anesthesia, Unasyn 1.5 g IV and diclofenac                            100 mg per rectum Complications:            No immediate complications. Estimated Blood Loss:     Estimated blood loss: none. Procedure:                Pre-Anesthesia Assessment:                           - Prior to the procedure, a History and Physical                            was performed, and patient medications and                            allergies were reviewed. The patient's tolerance of                            previous anesthesia was also reviewed. The risks                            and benefits of the procedure and the sedation                            options and risks were discussed with the patient.                            All questions were answered, and informed consent                            was obtained. Prior Anticoagulants: The patient has                            taken no anticoagulant or antiplatelet agents. ASA                            Grade Assessment: II - A patient with mild systemic  disease. After reviewing the risks and benefits,                            the patient was deemed in satisfactory condition to                            undergo the procedure.                           After obtaining informed consent, the scope was                            passed under direct vision. Throughout the                            procedure, the patient's blood  pressure, pulse, and                            oxygen saturations were monitored continuously. The                            [employer] single use                            duodenoscope was introduced through the mouth, and                            used to inject contrast into and used to inject                            contrast into the bile duct. The ERCP was                            accomplished without difficulty. The patient                            tolerated the procedure well. Scope In: Scope Out: Findings:      A scout film of the abdomen was obtained. Surgical clips, consistent       with a previous cholecystectomy, were seen in the area of the right       upper quadrant of the abdomen. The esophagus was successfully intubated       under direct vision. The scope was advanced to a normal major papilla in       the descending duodenum without detailed examination of the pharynx,       larynx and associated structures, and upper GI tract. The upper GI tract       was grossly normal. The bile duct was deeply cannulated with the       short-nosed traction sphincterotome. Contrast was injected. I personally       interpreted the bile duct images. The biliary tree was moderately       dilated max 12 mm and ? distal CBD stone. A 5 mm biliary sphincterotomy       was made with a short nose sphincterotome. There was no  post-sphincterotomy bleeding. The lower third of the main bile duct       contained one stone, which was 6 mm in diameter. The biliary tree was       swept with a 12 mm balloon starting at the lower third of the main duct.       One stone was removed. No stones remained. Pancreas not entered by       intent. Impression:               - Choledocholithiasis was found. Moderate                            extrahepatic biliary dilation, maximum 12 mm.                            Complete stone removal was accomplished by biliary                             sphincterotomy and balloon extraction.                           - A biliary sphincterotomy was performed.                           - The biliary tree was swept and a 6 mm stone was                            removed from very distal duct (this corresponds to                            the stone seen on IOC) Recommendation:           - return to floor                           clear liquid diet today and advance tomorrow if ok                            and home then also if ok Procedure Code(s):        --- Professional ---                           [phone], Endoscopic retrograde                            cholangiopancreatography (ERCP); with removal of                            calculi/debris from biliary/pancreatic duct(s)                           [id number], Endoscopic retrograde                            cholangiopancreatography (ERCP); with  sphincterotomy/papillotomy                           [id number], Endoscopic catheterization of the biliary                            ductal system, radiological supervision and                            interpretation Diagnosis Code(s):        --- Professional ---                           K80.50, Calculus of bile duct without cholangitis                            or cholecystitis without obstruction CPT copyright 2022 [employer]. All rights reserved. The codes documented in this report are preliminary and upon coder review may  be revised to meet current compliance requirements. Iva Boop, MD 09/29/2023 2:06:32 PM This report has been signed electronically. Number of Addenda: 0

## 2023-09-29 NOTE — Transfer of Care (Signed)
Immediate Anesthesia Transfer of Care Note  Patient: Danielle Hoover  Procedure(s) Performed: ENDOSCOPIC RETROGRADE CHOLANGIOPANCREATOGRAPHY (ERCP) SPHINCTEROTOMY REMOVAL OF STONES  Patient Location: PACU  Anesthesia Type:General  Level of Consciousness: awake and alert   Airway & Oxygen Therapy: Patient Spontanous Breathing and Patient connected to face mask oxygen  Post-op Assessment: Report given to RN and Post -op Vital signs reviewed and stable  Post vital signs: Reviewed and stable  Last Vitals:  Vitals Value Taken Time  BP    Temp    Pulse 100 09/29/23 1409  Resp 25 09/29/23 1409  SpO2 100 % 09/29/23 1409  Vitals shown include unfiled device data.  Last Pain:  Vitals:   09/29/23 1228  TempSrc: Temporal  PainSc: 0-No pain      Patients Stated Pain Goal: 2 (09/29/23 9563)  Complications: No notable events documented.

## 2023-09-29 NOTE — Anesthesia Procedure Notes (Signed)
Procedure Name: Intubation Date/Time: 09/29/2023 1:22 PM  Performed by: Berniece Pap, CRNAPre-anesthesia Checklist: Patient identified, Emergency Drugs available, Suction available and Patient being monitored Patient Re-evaluated:Patient Re-evaluated prior to induction Oxygen Delivery Method: Circle system utilized Preoxygenation: Pre-oxygenation with 100% oxygen Induction Type: IV induction Ventilation: Mask ventilation without difficulty Laryngoscope Size: Mac and 3 Grade View: Grade II Tube type: Oral Tube size: 7.0 mm Number of attempts: 1 Airway Equipment and Method: Stylet and Oral airway Placement Confirmation: ETT inserted through vocal cords under direct vision, positive ETCO2 and breath sounds checked- equal and bilateral Secured at: 22 cm Tube secured with: Tape Dental Injury: Teeth and Oropharynx as per pre-operative assessment

## 2023-09-29 NOTE — Progress Notes (Signed)
Progress Note  1 Day Post-Op  Subjective: Having some abdominal pain but better than pre op and improves with pain medication. Had a little bit of clear liquids yesterday without n/v/abd pain. Having some indigestion. Passing flatus. ambulating   Objective: Vital signs in last 24 hours: Temp:  [98 F (36.7 C)-98.9 F (37.2 C)] 98.3 F (36.8 C) (11/25 0720) Pulse Rate:  [79-99] 84 (11/25 0720) Resp:  [16-22] 16 (11/25 0720) BP: (96-124)/(59-81) 98/63 (11/25 0720) SpO2:  [94 %-100 %] 99 % (11/25 0720)    Intake/Output from previous day: 11/24 0701 - 11/25 0700 In: 1059.5 [I.V.:959.5; IV Piggyback:100] Out: 15 [Blood:15] Intake/Output this shift: No intake/output data recorded.  PE: General: pleasant, WD, female who is laying in bed in NAD Lungs:Respiratory effort nonlabored Abd: soft, ND, appropriate TTP greatest over incision which are cdi MSK: all 4 extremities are symmetrical with no cyanosis, clubbing, or edema. Skin: warm and dry Psych: A&Ox3 with an appropriate affect.    Lab Results:  Recent Labs    09/28/23 0407 09/29/23 0704  WBC 10.8* 9.1  HGB 13.5 11.4*  HCT 41.3 34.3*  PLT 298 230   BMET Recent Labs    09/28/23 0407 09/29/23 0704  NA 141 138  K 2.9* 3.3*  CL 103 107  CO2 26 21*  GLUCOSE 107* 116*  BUN 15 8  CREATININE 0.80 0.71  CALCIUM 9.3 8.4*   PT/INR Recent Labs    09/29/23 0704  LABPROT 13.5  INR 1.0   CMP     Component Value Date/Time   NA 138 09/29/2023 0704   NA 143 08/27/2023 1108   K 3.3 (L) 09/29/2023 0704   CL 107 09/29/2023 0704   CO2 21 (L) 09/29/2023 0704   GLUCOSE 116 (H) 09/29/2023 0704   BUN 8 09/29/2023 0704   BUN 11 08/27/2023 1108   CREATININE 0.71 09/29/2023 0704   CALCIUM 8.4 (L) 09/29/2023 0704   PROT 5.9 (L) 09/29/2023 0704   PROT 6.8 08/27/2023 1108   ALBUMIN 2.7 (L) 09/29/2023 0704   ALBUMIN 3.8 (L) 08/27/2023 1108   AST 199 (H) 09/29/2023 0704   ALT 259 (H) 09/29/2023 0704   ALKPHOS 187 (H)  09/29/2023 0704   BILITOT 0.2 09/29/2023 0704   BILITOT 0.4 08/27/2023 1108   GFRNONAA >60 09/29/2023 0704   GFRAA 144 02/02/2019 1604   Lipase     Component Value Date/Time   LIPASE 69 (H) 09/28/2023 0407       Studies/Results: DG Cholangiogram Operative  Result Date: 09/28/2023 CLINICAL DATA:  Intraoperative cholangiography.  Gallstones. EXAM: INTRAOPERATIVE CHOLANGIOGRAM TECHNIQUE: Cholangiographic images from the C-arm fluoroscopic device were submitted for interpretation post-operatively. Please see the procedural report for the amount of contrast and the fluoroscopy time utilized. FLUOROSCOPY: Radiation Exposure Index (as provided by the fluoroscopic device): 2.6 mGy Kerma ultrasound COMPARISON:  Exam from earlier same day FINDINGS: Cannulation of the cystic duct evident with opacification of the intra and extrahepatic biliary tree. Mild diffuse distention evident. There is a rounded filling defect in the distal common bile duct beyond which no contrast passes. There is no evidence for contrast flow into the duodenum on this single image. IMPRESSION: Imaging features compatible with impacted common bile duct stone in the region of the ampulla. Electronically Signed   By: Kennith Center M.D.   On: 09/28/2023 11:34   US Abdomen Limited  Result Date: 09/28/2023 CLINICAL DATA:  Right upper quadrant pain for 4 days. EXAM: ULTRASOUND ABDOMEN LIMITED RIGHT  UPPER QUADRANT COMPARISON:  None Available. FINDINGS: Gallbladder: Multiple stones are noted within the gallbladder which measure up to 1.2 cm. The gallbladder wall thickness measures 3 mm. No pericholecystic fluid or sludge. Negative sonographic Murphy's sign. Common bile duct: Diameter: 6.5 mm Liver: No focal lesion identified. Within normal limits in parenchymal echogenicity. Portal vein is patent on color Doppler imaging with normal direction of blood flow towards the liver. Other: None. IMPRESSION: 1. Cholelithiasis. Upper limits of  normal gallbladder wall thickness at 3 mm. No pericholecystic fluid or positive sonographic Murphy's sign. 2. Common bile duct is mildly dilated measuring 6.5 mm. Correlate with liver function tests. If there is concern for biliary obstruction, consider further evaluation with MRCP. Electronically Signed   By: Signa Kell M.D.   On: 09/28/2023 05:20    Anti-infectives: Anti-infectives (From admission, onward)    Start     Dose/Rate Route Frequency Ordered Stop   09/28/23 0615  cefTRIAXone (ROCEPHIN) 2 g in sodium chloride 0.9 % 100 mL IVPB        2 g 200 mL/hr over 30 Minutes Intravenous  Once 09/28/23 0612 09/28/23 1023        Assessment/Plan Laparoscopic cholecystectomy with intraoperative cholangiogram 11/25 by CW  - IOC with filling defect and GI evaluated. ERCP today and appreciate their assistance - t bili improved to 0.2 (1.4) - NPO for GI otherwise can have liquids - hypokalemia - IVF with K and will give additional IV K  FEN: NPO for GI ID: rocephin preop VTE: LMWH held for possible ERCP  Due to language barrier, an interpreter was present during the history-taking and subsequent discussion (and for part of the physical exam) with this patient.    LOS: 1 day   Eric Form, [facility] 09/29/2023, 9:36 AM Please see Amion for pager number during day hours 7:00am-4:30pm

## 2023-09-29 NOTE — Anesthesia Postprocedure Evaluation (Signed)
Anesthesia Post Note  Patient: Hannah Cochrane  Procedure(s) Performed: ENDOSCOPIC RETROGRADE CHOLANGIOPANCREATOGRAPHY (ERCP) SPHINCTEROTOMY REMOVAL OF STONES     Patient location during evaluation: PACU Anesthesia Type: General Level of consciousness: awake and alert Pain management: pain level controlled Vital Signs Assessment: post-procedure vital signs reviewed and stable Respiratory status: spontaneous breathing, nonlabored ventilation, respiratory function stable and patient connected to nasal cannula oxygen Cardiovascular status: blood pressure returned to baseline and stable Postop Assessment: no apparent nausea or vomiting Anesthetic complications: no   No notable events documented.  Last Vitals:  Vitals:   09/29/23 1430 09/29/23 1440  BP: 114/70 111/71  Pulse: 87 80  Resp: (!) 23 20  Temp:    SpO2: 95% 94%    Last Pain:  Vitals:   09/29/23 1440  TempSrc:   PainSc: 0-No pain                 Mariann Barter

## 2023-09-29 NOTE — Interval H&P Note (Signed)
History and Physical Interval Note:  09/29/2023 1:12 PM  Danielle Hoover  has presented today for surgery, with the diagnosis of Common bile duct stone.  The various methods of treatment have been discussed with the patient and family. After consideration of risks, benefits and other options for treatment, the patient has consented to  Procedure(s): ENDOSCOPIC RETROGRADE CHOLANGIOPANCREATOGRAPHY (ERCP) (N/A) as a surgical intervention.  The patient's history has been reviewed, patient examined, no change in status, stable for surgery.  I have reviewed the patient's chart and labs.  Questions were answered to the patient's satisfaction.     Stan Head

## 2023-09-29 NOTE — Anesthesia Preprocedure Evaluation (Signed)
Anesthesia Evaluation  Patient identified by MRN, date of birth, ID band Patient awake    Reviewed: Allergy & Precautions, NPO status , Patient's Chart, lab work & pertinent test results, reviewed documented beta blocker date and time   History of Anesthesia Complications Negative for: history of anesthetic complications  Airway Mallampati: III  TM Distance: >3 FB     Dental no notable dental hx.    Pulmonary asthma , neg sleep apnea, neg COPD, neg PE   breath sounds clear to auscultation       Cardiovascular hypertension, (-) angina (-) CAD, (-) Past MI, (-) Cardiac Stents and (-) CABG  Rhythm:Regular Rate:Normal     Neuro/Psych  Headaches, neg Seizures    GI/Hepatic ,neg GERD  ,,(+) neg Cirrhosis        Endo/Other  neg diabetes    Renal/GU Renal disease     Musculoskeletal   Abdominal   Peds  Hematology  (+) Blood dyscrasia, anemia   Anesthesia Other Findings   Reproductive/Obstetrics                             Anesthesia Physical Anesthesia Plan  ASA: 2  Anesthesia Plan: General   Post-op Pain Management:    Induction: Intravenous  PONV Risk Score and Plan: 2 and Ondansetron and Dexamethasone  Airway Management Planned: Oral ETT  Additional Equipment:   Intra-op Plan:   Post-operative Plan: Extubation in OR  Informed Consent: I have reviewed the patients History and Physical, chart, labs and discussed the procedure including the risks, benefits and alternatives for the proposed anesthesia with the patient or authorized representative who has indicated his/her understanding and acceptance.     Dental advisory given  Plan Discussed with: CRNA  Anesthesia Plan Comments:        Anesthesia Quick Evaluation

## 2023-09-29 NOTE — Plan of Care (Signed)
  Problem: Education: Goal: Knowledge of General Education information will improve Description Including pain rating scale, medication(s)/side effects and non-pharmacologic comfort measures Outcome: Progressing   Problem: Health Behavior/Discharge Planning: Goal: Ability to manage health-related needs will improve Outcome: Progressing   

## 2023-09-30 DIAGNOSIS — Z4889 Encounter for other specified surgical aftercare: Secondary | ICD-10-CM | POA: Diagnosis not present

## 2023-09-30 DIAGNOSIS — Z9049 Acquired absence of other specified parts of digestive tract: Secondary | ICD-10-CM | POA: Diagnosis not present

## 2023-09-30 DIAGNOSIS — K810 Acute cholecystitis: Principal | ICD-10-CM

## 2023-09-30 LAB — COMPREHENSIVE METABOLIC PANEL
ALT: 202 U/L — ABNORMAL HIGH (ref 0–44)
AST: 104 U/L — ABNORMAL HIGH (ref 15–41)
Albumin: 2.9 g/dL — ABNORMAL LOW (ref 3.5–5.0)
Alkaline Phosphatase: 167 U/L — ABNORMAL HIGH (ref 38–126)
Anion gap: 9 (ref 5–15)
BUN: 9 mg/dL (ref 6–20)
CO2: 24 mmol/L (ref 22–32)
Calcium: 8.5 mg/dL — ABNORMAL LOW (ref 8.9–10.3)
Chloride: 103 mmol/L (ref 98–111)
Creatinine, Ser: 0.84 mg/dL (ref 0.44–1.00)
GFR, Estimated: >60 mL/min (ref >60)
Glucose, Bld: 119 mg/dL — ABNORMAL HIGH (ref 70–99)
Potassium: 3.6 mmol/L (ref 3.5–5.1)
Sodium: 136 mmol/L (ref 135–145)
Total Bilirubin: 0.6 mg/dL (ref <1.2)
Total Protein: 6 g/dL — ABNORMAL LOW (ref 6.5–8.1)

## 2023-09-30 LAB — CBC
HCT: 35.7 % — ABNORMAL LOW (ref 36.0–46.0)
Hemoglobin: 11.7 g/dL — ABNORMAL LOW (ref 12.0–15.0)
MCH: 25 pg — ABNORMAL LOW (ref 26.0–34.0)
MCHC: 32.8 g/dL (ref 30.0–36.0)
MCV: 76.3 fL — ABNORMAL LOW (ref 80.0–100.0)
Platelets: 228 10*3/uL (ref 150–400)
RBC: 4.68 MIL/uL (ref 3.87–5.11)
RDW: 14.6 % (ref 11.5–15.5)
WBC: 8.5 10*3/uL (ref 4.0–10.5)
nRBC: 0 % (ref 0.0–0.2)

## 2023-09-30 LAB — IRON AND TIBC
Iron: 41 ug/dL (ref 28–170)
Saturation Ratios: 11 % (ref 10.4–31.8)
TIBC: 361 ug/dL (ref 250–450)
UIBC: 320 ug/dL

## 2023-09-30 LAB — FERRITIN: Ferritin: 25 ng/mL (ref 11–307)

## 2023-09-30 MED ORDER — IBUPROFEN 600 MG PO TABS: 600.0000 mg | ORAL_TABLET | Freq: Four times a day (QID) | ORAL | Status: DC | PRN

## 2023-09-30 MED ORDER — FERROUS SULFATE 325 (65 FE) MG PO TABS: 325.0000 mg | ORAL_TABLET | Freq: Every day | ORAL | Status: DC

## 2023-09-30 MED ORDER — DOCUSATE SODIUM 100 MG PO CAPS: 100.0000 mg | ORAL_CAPSULE | Freq: Two times a day (BID) | ORAL | Status: DC | PRN

## 2023-09-30 MED ORDER — ACETAMINOPHEN 500 MG PO TABS: 1000.0000 mg | ORAL_TABLET | Freq: Four times a day (QID) | ORAL | Status: AC | PRN

## 2023-09-30 MED ORDER — OXYCODONE HCL 5 MG PO TABS: 5.0000 mg | ORAL_TABLET | Freq: Four times a day (QID) | ORAL | 0 refills | Status: AC | PRN

## 2023-09-30 NOTE — Discharge Summary (Signed)
[facility] Discharge Summary   Patient ID: Danielle Hoover MRN: [id number] DOB/AGE: 07-Aug-1981 42 y.o.  Admit date: 09/28/2023 Discharge date: 09/30/2023   Discharge Diagnosis Symptomatic cholelithiasis  Choledocholithiasis   Consultants Gastroenterology   Imaging: DG ERCP  Result Date: 09/29/2023 CLINICAL DATA:  ERCP for choledocholithiasis EXAM: ERCP TECHNIQUE: Multiple spot images obtained with the fluoroscopic device and submitted for interpretation post-procedure. FLUOROSCOPY TIME: FLUOROSCOPY TIME 1 minute, 30 seconds (19 mGy) COMPARISON:  Intraoperative cholangiogram during laparoscopic cholecystectomy-09/28/2023 FINDINGS: Six spot fluoroscopic images of the right abdominal quadrant during ERCP are provided for review Initial image demonstrates an ERCP probe overlying the right upper abdominal quadrant. Cholecystectomy clips overlies expected location of the gallbladder fossa Subsequent images demonstrate selective cannulation and opacification of the common bile duct which appears mildly dilated. There is a persistent nonocclusive filling defect within the distal aspect of the CBD compatible with questioned choledocholithiasis demonstrated on preceding intraoperative cholangiogram Subsequent images demonstrate insufflation of a balloon within the central aspect of the CBD with subsequent biliary sweeping, presumed stone extraction and sphincterotomy. There is minimal opacification of the cystic duct without definitive evidence of contrast extravasation. There is minimal opacification of the intrahepatic biliary tree which appears mildly dilated. There is no definitive opacification of the pancreatic duct. IMPRESSION: ERCP with findings of choledocholithiasis with subsequent presumed biliary sweeping, stone extraction and sphincterotomy. These images were submitted for radiologic interpretation only. Please see the procedural report for the amount of contrast and the fluoroscopy  time utilized. Electronically Signed   By: Simonne Come M.D.   On: 09/29/2023 14:18    Procedures Dr. Cliffton Asters (09/28/2023) - Laparoscopic Cholecystectomy with IOC Dr. Leone Payor (09/29/2023) - ERCP  Hospital Course:  Danielle Hoover is a 42 y.o. female who presented to the ED 11/24 with abdominal pain and nausea.  Workup showed transaminitis and cholelithiasis.  Patient was admitted and underwent procedure listed above.  IOC compatible with impacted common bile duct stone in the region of the ampulla. Patient tolerated procedure well and was transferred to the floor.  Gastroenterology was consulted for positive IOC and took the patient for ERCP 11/25 where they performed successful stone extraction and sphincterotomy. LFTs monitored and trended down. On POD2, the patient was voiding well, tolerating diet, ambulating well, pain well controlled, vital signs stable, incisions c/d/i and felt stable for discharge home.  Patient will follow up as below and knows to call with questions or concerns.    I have personally reviewed the patients medication history on the  controlled substance database.     Allergies as of 09/30/2023   No Known Allergies      Medication List     TAKE these medications    acetaminophen 500 MG tablet Commonly known as: TYLENOL Take 2 tablets (1,000 mg total) by mouth every 6 (six) hours as needed for mild pain (pain score 1-3).   atorvastatin 80 MG tablet Commonly known as: LIPITOR Take 1 tablet (80 mg total) by mouth daily.   docusate sodium 100 MG capsule Commonly known as: COLACE Take 1 capsule (100 mg total) by mouth 2 (two) times daily as needed for mild constipation.   hydrochlorothiazide 12.5 MG tablet Commonly known as: HYDRODIURIL Take 1 tablet (12.5 mg total) by mouth daily.   ibuprofen 600 MG tablet Commonly known as: ADVIL Take 1 tablet (600 mg total) by mouth every 6 (six) hours as needed.   NIFEdipine 30 MG 24 hr tablet Commonly known as:  PROCARDIA-XL/NIFEDICAL-XL Take 1 tablet (30  mg total) by mouth daily.   oxyCODONE 5 MG immediate release tablet Commonly known as: Oxy IR/ROXICODONE Take 1 tablet (5 mg total) by mouth every 6 (six) hours as needed for up to 5 days for moderate pain (pain score 4-6).          Follow-up Information     Andria Meuse, MD. Go on 10/22/2023.   Specialties: General Surgery, Colon and Rectal Surgery Why: 9:40 AM, please arrive by 9:15 AM to check in. Contact information: [address] [place] [address] [phone]                  Signed: Franne Forts, [facility] 09/30/2023, 2:55 PM Please see Amion for pager number during day hours 7:00am-4:30pm

## 2023-09-30 NOTE — Plan of Care (Signed)

## 2023-09-30 NOTE — Progress Notes (Signed)
Progress Note  1 Day Post-Op  Subjective: Doing well after ERCP. No n/v with CLD. Still having some post op abdominal pain better with pain medications. BM this am  Objective: Vital signs in last 24 hours: Temp:  [97.6 F (36.4 C)-99.4 F (37.4 C)] 98.7 F (37.1 C) (11/26 0750) Pulse Rate:  [72-100] 72 (11/26 0750) Resp:  [16-25] 16 (11/26 0750) BP: (99-123)/(54-81) 105/60 (11/26 0750) SpO2:  [94 %-100 %] 97 % (11/26 0750) Last BM Date :  (per pt prior to admission)  Intake/Output from previous day: 11/25 0701 - 11/26 0700 In: 1529.5 [P.O.:270; I.V.:907.9; IV Piggyback:351.6] Out: 0  Intake/Output this shift: No intake/output data recorded.  PE: General: pleasant, WD, female who is laying in bed in NAD Lungs:Respiratory effort nonlabored Abd: soft, ND, appropriate TTP greatest over incisions which are cdi MSK: all 4 extremities are symmetrical with no cyanosis, clubbing, or edema. Skin: warm and dry Psych: A&Ox3 with an appropriate affect.    Lab Results:  Recent Labs    09/29/23 0704 09/30/23 0623  WBC 9.1 8.5  HGB 11.4* 11.7*  HCT 34.3* 35.7*  PLT 230 228   BMET Recent Labs    09/29/23 0704 09/30/23 0623  NA 138 136  K 3.3* 3.6  CL 107 103  CO2 21* 24  GLUCOSE 116* 119*  BUN 8 9  CREATININE 0.71 0.84  CALCIUM 8.4* 8.5*   PT/INR Recent Labs    09/29/23 0704  LABPROT 13.5  INR 1.0   CMP     Component Value Date/Time   NA 136 09/30/2023 0623   NA 143 08/27/2023 1108   K 3.6 09/30/2023 0623   CL 103 09/30/2023 0623   CO2 24 09/30/2023 0623   GLUCOSE 119 (H) 09/30/2023 0623   BUN 9 09/30/2023 0623   BUN 11 08/27/2023 1108   CREATININE 0.84 09/30/2023 0623   CALCIUM 8.5 (L) 09/30/2023 0623   PROT 6.0 (L) 09/30/2023 0623   PROT 6.8 08/27/2023 1108   ALBUMIN 2.9 (L) 09/30/2023 0623   ALBUMIN 3.8 (L) 08/27/2023 1108   AST 104 (H) 09/30/2023 0623   ALT 202 (H) 09/30/2023 0623   ALKPHOS 167 (H) 09/30/2023 0623   BILITOT 0.6 09/30/2023  0623   BILITOT 0.4 08/27/2023 1108   GFRNONAA >60 09/30/2023 0623   GFRAA 144 02/02/2019 1604   Lipase     Component Value Date/Time   LIPASE 69 (H) 09/28/2023 0407       Studies/Results: DG ERCP  Result Date: 09/29/2023 CLINICAL DATA:  ERCP for choledocholithiasis EXAM: ERCP TECHNIQUE: Multiple spot images obtained with the fluoroscopic device and submitted for interpretation post-procedure. FLUOROSCOPY TIME: FLUOROSCOPY TIME 1 minute, 30 seconds (19 mGy) COMPARISON:  Intraoperative cholangiogram during laparoscopic cholecystectomy-09/28/2023 FINDINGS: Six spot fluoroscopic images of the right abdominal quadrant during ERCP are provided for review Initial image demonstrates an ERCP probe overlying the right upper abdominal quadrant. Cholecystectomy clips overlies expected location of the gallbladder fossa Subsequent images demonstrate selective cannulation and opacification of the common bile duct which appears mildly dilated. There is a persistent nonocclusive filling defect within the distal aspect of the CBD compatible with questioned choledocholithiasis demonstrated on preceding intraoperative cholangiogram Subsequent images demonstrate insufflation of a balloon within the central aspect of the CBD with subsequent biliary sweeping, presumed stone extraction and sphincterotomy. There is minimal opacification of the cystic duct without definitive evidence of contrast extravasation. There is minimal opacification of the intrahepatic biliary tree which appears mildly dilated. There is no  definitive opacification of the pancreatic duct. IMPRESSION: ERCP with findings of choledocholithiasis with subsequent presumed biliary sweeping, stone extraction and sphincterotomy. These images were submitted for radiologic interpretation only. Please see the procedural report for the amount of contrast and the fluoroscopy time utilized. Electronically Signed   By: Simonne Come M.D.   On: 09/29/2023 14:18   DG  Cholangiogram Operative  Result Date: 09/28/2023 CLINICAL DATA:  Intraoperative cholangiography.  Gallstones. EXAM: INTRAOPERATIVE CHOLANGIOGRAM TECHNIQUE: Cholangiographic images from the C-arm fluoroscopic device were submitted for interpretation post-operatively. Please see the procedural report for the amount of contrast and the fluoroscopy time utilized. FLUOROSCOPY: Radiation Exposure Index (as provided by the fluoroscopic device): 2.6 mGy Kerma ultrasound COMPARISON:  Exam from earlier same day FINDINGS: Cannulation of the cystic duct evident with opacification of the intra and extrahepatic biliary tree. Mild diffuse distention evident. There is a rounded filling defect in the distal common bile duct beyond which no contrast passes. There is no evidence for contrast flow into the duodenum on this single image. IMPRESSION: Imaging features compatible with impacted common bile duct stone in the region of the ampulla. Electronically Signed   By: Kennith Center M.D.   On: 09/28/2023 11:34    Anti-infectives: Anti-infectives (From admission, onward)    Start     Dose/Rate Route Frequency Ordered Stop   09/29/23 1245  ampicillin-sulbactam (UNASYN) 1.5 g in sodium chloride 0.9 % 100 mL IVPB        1.5 g 200 mL/hr over 30 Minutes Intravenous  Once 09/29/23 1230 09/29/23 1457   09/28/23 0615  cefTRIAXone (ROCEPHIN) 2 g in sodium chloride 0.9 % 100 mL IVPB        2 g 200 mL/hr over 30 Minutes Intravenous  Once 09/28/23 0612 09/28/23 1023        Assessment/Plan Laparoscopic cholecystectomy with intraoperative cholangiogram 11/25 by CW  - IOC with filling defect and GI evaluated - s/p ERCP 11/25 with stone extraction and sphincterotomy - advance diet and home this afternoon if tolerating  FEN: soft ID: [email or web address] preop VTE: LMWH held for GI  Due to language barrier, an interpreter was present during the history-taking and subsequent discussion (and for part of the physical exam) with this  patient.    LOS: 2 days   Eric Form, [facility] 09/30/2023, 9:14 AM Please see Amion for pager number during day hours 7:00am-4:30pm

## 2023-09-30 NOTE — Progress Notes (Signed)
Daily Progress Note  DOA: 09/28/2023 Hospital Day: 3  Chief Complaint: bile duct stone  ASSESSMENT    Brief Narrative:  Danielle Hoover is a 42 y.o. year old female with a history of HTN, asthma.  Admitted 11/24 with cholelithiasis and suspected acute cholecystitis. Underwent lap cholecystectomy, IOC +.   Cholelithiasis s/p lap cholecystectomy with + IOC on 09/28/23  Choledocholithiasis, s/p ERCP with sphincterotomy and stone removal 11/25.  Today:  Liver enzymes improving AST 104 / ALT 202. Tbili normal. Alk phos better 187 >> 167.   Microcytosis without anemia.  Rule out iron deficiency. Thalassemia?  Hgb acutely low at 11.7 but likely 2/2 to IVF it was previously normal.   Principal Problem:   S/P laparoscopic cholecystectomy Active Problems:   Choledocholithiasis   RUQ pain   Elevated liver enzymes   PLAN   Ferritin, TIBC.    Subjective   No significant abdominal pain . No N/V.    Objective    Recent Labs    09/28/23 0407 09/29/23 0704 09/30/23 0623  WBC 10.8* 9.1 8.5  HGB 13.5 11.4* 11.7*  HCT 41.3 34.3* 35.7*  PLT 298 230 228   BMET Recent Labs    09/28/23 0407 09/29/23 0704 09/30/23 0623  NA 141 138 136  K 2.9* 3.3* 3.6  CL 103 107 103  CO2 26 21* 24  GLUCOSE 107* 116* 119*  BUN 15 8 9   CREATININE 0.80 0.71 0.84  CALCIUM 9.3 8.4* 8.5*   LFT Recent Labs    09/30/23 0623  PROT 6.0*  ALBUMIN 2.9*  AST 104*  ALT 202*  ALKPHOS 167*  BILITOT 0.6   PT/INR Recent Labs    09/29/23 0704  LABPROT 13.5  INR 1.0     Imaging:  DG ERCP CLINICAL DATA:  ERCP for choledocholithiasis  EXAM: ERCP  TECHNIQUE: Multiple spot images obtained with the fluoroscopic device and submitted for interpretation post-procedure.  FLUOROSCOPY TIME: FLUOROSCOPY TIME 1 minute, 30 seconds (19 mGy)  COMPARISON:  Intraoperative cholangiogram during laparoscopic cholecystectomy-09/28/2023  FINDINGS: Six spot fluoroscopic images of the right  abdominal quadrant during ERCP are provided for review  Initial image demonstrates an ERCP probe overlying the right upper abdominal quadrant. Cholecystectomy clips overlies expected location of the gallbladder fossa  Subsequent images demonstrate selective cannulation and opacification of the common bile duct which appears mildly dilated. There is a persistent nonocclusive filling defect within the distal aspect of the CBD compatible with questioned choledocholithiasis demonstrated on preceding intraoperative cholangiogram  Subsequent images demonstrate insufflation of a balloon within the central aspect of the CBD with subsequent biliary sweeping, presumed stone extraction and sphincterotomy.  There is minimal opacification of the cystic duct without definitive evidence of contrast extravasation. There is minimal opacification of the intrahepatic biliary tree which appears mildly dilated. There is no definitive opacification of the pancreatic duct.  IMPRESSION: ERCP with findings of choledocholithiasis with subsequent presumed biliary sweeping, stone extraction and sphincterotomy.  These images were submitted for radiologic interpretation only. Please see the procedural report for the amount of contrast and the fluoroscopy time utilized.  Electronically Signed   By: Simonne Come M.D.   On: 09/29/2023 14:18     Scheduled inpatient medications:   acetaminophen  1,000 mg Oral Q6H   atorvastatin  80 mg Oral Daily   diclofenac  100 mg Rectal Once   docusate sodium  100 mg Oral BID   hydrochlorothiazide  12.5 mg Oral Daily   NIFEdipine  30  mg Oral Daily   senna  1 tablet Oral QHS   Continuous inpatient infusions:  PRN inpatient medications: alum & mag hydroxide-simeth, diphenhydrAMINE **OR** diphenhydrAMINE, HYDROmorphone (DILAUDID) injection, ibuprofen, metoprolol tartrate, ondansetron **OR** ondansetron (ZOFRAN) IV, oxyCODONE, simethicone, traMADol  Vital signs in last 24  hours: Temp:  [97.6 F (36.4 C)-99.4 F (37.4 C)] 98.7 F (37.1 C) (11/26 0750) Pulse Rate:  [72-100] 72 (11/26 0750) Resp:  [16-25] 16 (11/26 0750) BP: (99-123)/(54-81) 105/60 (11/26 0750) SpO2:  [94 %-100 %] 97 % (11/26 0750) Last BM Date :  (per pt prior to admission)  Intake/Output Summary (Last 24 hours) at 09/30/2023 0926 Last data filed at 09/29/2023 2115 Gross per 24 hour  Intake 1529.52 ml  Output 0 ml  Net 1529.52 ml    Intake/Output from previous day: 11/25 0701 - 11/26 0700 In: 1529.5 [P.O.:270; I.V.:907.9; IV Piggyback:351.6] Out: 0  Intake/Output this shift: No intake/output data recorded.   Physical Exam:  General: Alert female in NAD Heart:  Regular rate and rhythm.  Pulmonary: Normal respiratory effort Abdomen: Soft, nondistended, nontender. Normal bowel sounds. Extremities: No lower extremity edema  Neurologic: Alert and oriented Psych: Pleasant. Cooperative. Insight appears normal.      LOS: 2 days   Willette Cluster ,NP 09/30/2023, 9:26 AM

## 2023-09-30 NOTE — Progress Notes (Signed)
Discharge instructions given. Patient verbalized understanding and all questions were answered.  ?

## 2023-10-01 ENCOUNTER — Encounter (HOSPITAL_COMMUNITY): Payer: Self-pay | Admitting: Internal Medicine

## 2023-10-01 ENCOUNTER — Telehealth: Payer: Self-pay

## 2023-10-01 NOTE — Transitions of Care (Post Inpatient/ED Visit) (Signed)
   10/01/2023  Name: Danielle Hoover MRN: [id number] DOB: 11-22-1980  Today's TOC FU Call Status: Today's TOC FU Call Status:: Unsuccessful Call (1st Attempt) Unsuccessful Call (1st Attempt) Date: 10/01/23  Attempted to reach the patient regarding the most recent Inpatient/ED visit.  Follow Up Plan: Additional outreach attempts will be made to reach the patient to complete the Transitions of Care (Post Inpatient/ED visit) call.   Signature Robyne Peers, RN

## 2023-10-06 ENCOUNTER — Telehealth: Payer: Self-pay

## 2023-10-06 NOTE — Transitions of Care (Post Inpatient/ED Visit) (Signed)
   10/06/2023  Name: Danielle Hoover MRN: [id number] DOB: 20-Feb-1981  Today's TOC FU Call Status: Today's TOC FU Call Status:: Successful TOC FU Call Completed Unsuccessful Call (1st Attempt) Date: 10/01/23 [facility] FU Call Complete Date: 10/06/23 Patient's Name and Date of Birth confirmed.  Transition Care Management Follow-up Telephone Call Date of Discharge: 09/30/23 Discharge Facility: [facility]) Type of Discharge: Inpatient Admission Primary Inpatient Discharge Diagnosis:: s/p lap chole How have you been since you were released from the hospital?: Better Any questions or concerns?: No  Items Reviewed: Did you receive and understand the discharge instructions provided?: Yes (She stated the instructions were explained to her and she understands them) Medications obtained,verified, and reconciled?: Partial Review Completed Reason for Partial Mediation Review: She said she has all medications and did not have any questions about the med regime and did not need to review the med list Any new allergies since your discharge?: No Dietary orders reviewed?: Yes Type of Diet Ordered:: heart healthy, low sodium.  She said she understands but primarily eats white rice. Do you have support at home?: Yes People in Home: spouse Name of Support/Comfort Primary Source: her husband  Medications Reviewed Today: Medications Reviewed Today   Medications were not reviewed in this encounter     Home Care and Equipment/Supplies: Were Home Health Services Ordered?: No Any new equipment or medical supplies ordered?: No  Functional Questionnaire: Do you need assistance with bathing/showering or dressing?: No Do you need assistance with meal preparation?: No Do you need assistance with eating?: No Do you have difficulty maintaining continence: No Do you need assistance with getting out of bed/getting out of a chair/moving?: No Do you have difficulty managing or taking your medications?: No  Follow up  appointments reviewed: PCP Follow-up appointment confirmed?: Yes Date of PCP follow-up appointment?: 11/27/23 Follow-up Provider: Gwinda Passe, NP Specialist Hospital Follow-up appointment confirmed?: Yes Date of Specialist follow-up appointment?: 10/22/23 Follow-Up Specialty Provider:: surgeon Do you need transportation to your follow-up appointment?: No Do you understand care options if your condition(s) worsen?: Yes-patient verbalized understanding    SIGNATURE. Robyne Peers, RN

## 2023-11-27 ENCOUNTER — Ambulatory Visit (INDEPENDENT_AMBULATORY_CARE_PROVIDER_SITE_OTHER): Payer: Medicaid Other | Admitting: Primary Care

## 2023-11-27 ENCOUNTER — Encounter (INDEPENDENT_AMBULATORY_CARE_PROVIDER_SITE_OTHER): Payer: Self-pay | Admitting: Primary Care

## 2023-11-27 VITALS — BP 114/79 | HR 87 | Resp 16 | Ht <= 58 in | Wt 134.6 lb

## 2023-11-27 DIAGNOSIS — Z758 Other problems related to medical facilities and other health care: Secondary | ICD-10-CM

## 2023-11-27 DIAGNOSIS — Z5941 Food insecurity: Secondary | ICD-10-CM

## 2023-11-27 DIAGNOSIS — R7309 Other abnormal glucose: Secondary | ICD-10-CM

## 2023-11-27 DIAGNOSIS — E782 Mixed hyperlipidemia: Secondary | ICD-10-CM | POA: Diagnosis not present

## 2023-11-27 DIAGNOSIS — Z603 Acculturation difficulty: Secondary | ICD-10-CM

## 2023-11-27 DIAGNOSIS — I10 Essential (primary) hypertension: Secondary | ICD-10-CM | POA: Diagnosis not present

## 2023-11-27 DIAGNOSIS — Z30013 Encounter for initial prescription of injectable contraceptive: Secondary | ICD-10-CM | POA: Diagnosis not present

## 2023-11-27 DIAGNOSIS — Z6829 Body mass index (BMI) 29.0-29.9, adult: Secondary | ICD-10-CM

## 2023-11-27 DIAGNOSIS — E669 Obesity, unspecified: Secondary | ICD-10-CM

## 2023-11-27 DIAGNOSIS — Z760 Encounter for issue of repeat prescription: Secondary | ICD-10-CM

## 2023-11-27 MED ORDER — MEDROXYPROGESTERONE ACETATE 150 MG/ML IM SUSP
150.0000 mg | Freq: Once | INTRAMUSCULAR | Status: AC
  Administered 2023-11-27: 150 mg via INTRAMUSCULAR

## 2023-11-27 MED ORDER — HYDROCHLOROTHIAZIDE 12.5 MG PO TABS: 12.5000 mg | ORAL_TABLET | Freq: Every day | ORAL | 1 refills | Status: DC

## 2023-11-27 MED ORDER — NIFEDIPINE ER OSMOTIC RELEASE 30 MG PO TB24: 30.0000 mg | ORAL_TABLET | Freq: Every day | ORAL | 1 refills | Status: DC

## 2023-11-27 MED ORDER — ATORVASTATIN CALCIUM 80 MG PO TABS: 80.0000 mg | ORAL_TABLET | Freq: Every day | ORAL | 1 refills | Status: DC

## 2023-11-27 NOTE — Progress Notes (Signed)
[facility]  Danielle Hoover, is a 43 y.o. female  OZH:[id number]  GEX:[id number]  DOB - [birth year]  Chief Complaint  Patient presents with   Hypertension   Hyperlipidemia       Subjective:   Danielle Hoover is a 43 y.o. [place] Danielle Hoover ( interpreter Danielle Hoover)  here today for a follow up visit management of HTN .  Endorses taking  medication daily no side effect. Patient has No headache, No chest pain, No abdominal pain -No Nausea, No new weakness tingling or numbness, No Cough - shortness of breath. Labs for hyperlipidemia   No problems updated.  Comprehensive ROS Pertinent positive and negative noted in HPI   No Known Allergies  Past Medical History:  Diagnosis Date   Anemia    during pregnancy 2006   Asthma    Headache    Hx of chlamydia infection 2016   Hypertension     Current Outpatient Medications on File Prior to Visit  Medication Sig Dispense Refill   acetaminophen (TYLENOL) 500 MG tablet Take 2 tablets (1,000 mg total) by mouth every 6 (six) hours as needed for mild pain (pain score 1-3).     atorvastatin (LIPITOR) 80 MG tablet Take 1 tablet (80 mg total) by mouth daily. 90 tablet 1   docusate sodium (COLACE) 100 MG capsule Take 1 capsule (100 mg total) by mouth 2 (two) times daily as needed for mild constipation.     hydrochlorothiazide (HYDRODIURIL) 12.5 MG tablet Take 1 tablet (12.5 mg total) by mouth daily. 90 tablet 1   ibuprofen (ADVIL) 600 MG tablet Take 1 tablet (600 mg total) by mouth every 6 (six) hours as needed.     NIFEdipine (PROCARDIA-XL/NIFEDICAL-XL) 30 MG 24 hr tablet Take 1 tablet (30 mg total) by mouth daily. 90 tablet 1   No current facility-administered medications on file prior to visit.   Health Maintenance  Topic Date Due   COVID-19 Vaccine (1) Never done   Flu Shot  02/02/2024*   Pap with HPV screening  04/04/2024   DTaP/Tdap/Td vaccine (3 - Td or Tdap) 06/06/2029   Hepatitis C Screening  Completed   HIV Screening  Completed   HPV  Vaccine  Aged Out  *Topic was postponed. The date shown is not the original due date.    Objective:   Vitals:   11/27/23 1126  BP: 114/79  Pulse: 87  Resp: 16  SpO2: 99%  Weight: 134 lb 9.6 oz (61.1 kg)  Height: 4\' 9"  (1.448 m)   BP Readings from Last 3 Encounters:  11/27/23 114/79  09/30/23 105/60  08/27/23 133/84      Physical Exam Vitals reviewed.  Constitutional:      Appearance: Normal appearance. She is obese.  HENT:     Head: Normocephalic.     Right Ear: Tympanic membrane, ear canal and external ear normal.     Left Ear: Tympanic membrane, ear canal and external ear normal.     Nose: Nose normal.     Mouth/Throat:     Mouth: Mucous membranes are moist.  Eyes:     Extraocular Movements: Extraocular movements intact.     Pupils: Pupils are equal, round, and reactive to light.  Cardiovascular:     Rate and Rhythm: Normal rate.  Pulmonary:     Effort: Pulmonary effort is normal.     Breath sounds: Normal breath sounds.  Abdominal:     General: Bowel sounds are normal. There is distension.     Palpations:  Abdomen is soft.  Musculoskeletal:        General: Normal range of motion.     Cervical back: Normal range of motion.  Skin:    General: Skin is warm and dry.  Neurological:     Mental Status: She is alert and oriented to person, place, and time.  Psychiatric:        Mood and Affect: Mood normal.        Behavior: Behavior normal.        Thought Content: Thought content normal.    Assessment & Plan  Danielle Hoover was seen today for hypertension and hyperlipidemia.  Diagnoses and all orders for this visit:  Encounter for initial prescription of injectable contraceptive -     medroxyPROGESTERone (DEPO-PROVERA) injection 150 mg  Elevated glucose -     Hemoglobin A1c  Essential hypertension Well controlled taking medication daily complaint . BP goal -met < 130/80 Explained that having normal blood pressure is the goal and medications are helping to get to  goal and maintain normal blood pressure. DIET: Limit salt intake, read nutrition labels to check salt content, limit fried and high fatty foods  Avoid using multisymptom OTC cold preparations that generally contain sudafed which can rise BP. Consult with pharmacist on best cold relief products to use for persons with HTN EXERCISE Discussed incorporating exercise such as walking - 30 minutes most days of the week and can do in 10 minute intervals    -     hydrochlorothiazide (HYDRODIURIL) 12.5 MG tablet; Take 1 tablet (12.5 mg total) by mouth daily. -     NIFEdipine (PROCARDIA-XL/NIFEDICAL-XL) 30 MG 24 hr tablet; Take 1 tablet (30 mg total) by mouth daily.  Mixed hyperlipidemia -     Lipid panel -     atorvastatin (LIPITOR) 80 MG tablet; Take 1 tablet (80 mg total) by mouth daily.  Language barrier  Medication refill -     Hemoglobin A1c -     hydrochlorothiazide (HYDRODIURIL) 12.5 MG tablet; Take 1 tablet (12.5 mg total) by mouth daily. -     NIFEdipine (PROCARDIA-XL/NIFEDICAL-XL) 30 MG 24 hr tablet; Take 1 tablet (30 mg total) by mouth daily. -     atorvastatin (LIPITOR) 80 MG tablet; Take 1 tablet (80 mg total) by mouth daily.  Food insecurity -     AMB Referral [facility] Care Management       Patient have been counseled extensively about nutrition and exercise. Other issues discussed during this visit include: low cholesterol diet, weight control and daily exercise, foot care, annual eye examinations at Ophthalmology, importance of adherence with medications and regular follow-up. We also discussed long term complications of uncontrolled diabetes and hypertension.   Return in about 6 months (around 05/26/2024).  The patient was given clear instructions to go to ER or return to medical center if symptoms don't improve, worsen or new problems develop. The patient verbalized understanding. The patient was told to call to get lab results if they haven't heard anything in the next week.    This note has been created with Education officer, environmental. Any transcriptional errors are unintentional.   Danielle Sessions, NP 11/27/2023, 9:01 PM

## 2023-11-28 ENCOUNTER — Ambulatory Visit (INDEPENDENT_AMBULATORY_CARE_PROVIDER_SITE_OTHER): Payer: Medicaid Other | Admitting: Primary Care

## 2023-11-28 LAB — HEMOGLOBIN A1C
Est. average glucose Bld gHb Est-mCnc: 103 mg/dL
Hgb A1c MFr Bld: 5.2 % (ref 4.8–5.6)

## 2023-11-28 LAB — LIPID PANEL
Chol/HDL Ratio: 4.6 {ratio} — ABNORMAL HIGH (ref 0.0–4.4)
Cholesterol, Total: 178 mg/dL (ref 100–199)
HDL: 39 mg/dL — ABNORMAL LOW (ref >39)
LDL Chol Calc (NIH): 108 mg/dL — ABNORMAL HIGH (ref 0–99)
Triglycerides: 174 mg/dL — ABNORMAL HIGH (ref 0–149)
VLDL Cholesterol Cal: 31 mg/dL (ref 5–40)

## 2023-12-03 ENCOUNTER — Telehealth: Payer: Self-pay

## 2023-12-03 NOTE — Progress Notes (Signed)
Entered in error

## 2023-12-04 ENCOUNTER — Telehealth: Payer: Self-pay

## 2023-12-04 NOTE — Progress Notes (Signed)
Complex Care Management Note Care Guide Note  12/04/2023 Name: Danielle Hoover MRN: [id number] DOB: 15-Aug-1981  Danielle Hoover is a 43 y.o. year old female who is a primary care patient of Grayce Sessions, NP . The community resource team was consulted for assistance with Food Insecurity  SDOH screenings and interventions completed:  Yes  SDOH Interventions Today    Flowsheet Row Most Recent Value  SDOH Interventions   Food Insecurity Interventions Other (Comment)  [Per patient's request emailed [employer] pantry list to [email or web address]. Patient requested that information be sent in English.]        Care guide performed the following interventions: Spoke to patient with assistance from Elissa Lovett interpreter Mr. Y'Keo. Per patient request emailed [place] food pantry list and [employer] of  healthy food and OTC benefits. Patient stated she does not need any further assistance at this time.   Follow Up Plan:  No further follow up planned at this time. The patient has been provided with needed resources.  Encounter Outcome:  Patient Visit Completed  [place] [facility]  [facility] Guide Direct Dial: [phone]  Fax: [phone] Website: [email or web address]

## 2023-12-04 NOTE — Progress Notes (Signed)
Complex Care Management Note Care Guide Note  12/04/2023 Name: Danielle Hoover MRN: [id number] DOB: 09-23-1981   Complex Care Management Outreach Attempts: An unsuccessful telephone outreach was attempted today to offer the patient information about available complex care management services.  Follow Up Plan:  Additional outreach attempts will be made to offer the patient complex care management information and services.  I will contact Shanda Bumps with Language [employer].  Encounter Outcome:  No Answer  [facility]  [facility] Guide Direct Dial: [phone]  Fax: [phone] Website: [email or web address]

## 2023-12-04 NOTE — Progress Notes (Signed)
Complex Care Management Note Care Guide Note  12/04/2023 Name: Danielle Hoover MRN: [id number] DOB: 10/30/1981  Danielle Hoover is a 43 y.o. year old female who is a primary care patient of Danielle Sessions, NP . The community resource team was consulted for assistance with Food Insecurity  SDOH screenings and interventions completed:  No     Care guide performed the following interventions:  Spoke with Danielle Hoover at [employer] .  Follow Up Plan:   Danielle Hoover will contact me when an interpreter is available.  Encounter Outcome:  No Answer  Danielle Hoover [facility]  [facility] Guide Direct Dial: [phone]  Fax: [phone] Website: [email or web address]

## 2024-02-16 ENCOUNTER — Ambulatory Visit (HOSPITAL_COMMUNITY)
Admission: EM | Admit: 2024-02-16 | Discharge: 2024-02-16 | Disposition: A | Discharge status: Home / Self Care | Attending: Family Medicine | Admitting: Family Medicine

## 2024-02-16 ENCOUNTER — Encounter (HOSPITAL_COMMUNITY): Payer: Self-pay

## 2024-02-16 DIAGNOSIS — N3090 Cystitis, unspecified without hematuria: Secondary | ICD-10-CM | POA: Diagnosis not present

## 2024-02-16 LAB — POCT URINALYSIS DIP (MANUAL ENTRY)
Bilirubin, UA: NEGATIVE
Glucose, UA: NEGATIVE mg/dL
Ketones, POC UA: NEGATIVE mg/dL
Nitrite, UA: NEGATIVE
Protein Ur, POC: >=300 mg/dL — AB
Spec Grav, UA: 1.015 (ref 1.010–1.025)
Urobilinogen, UA: 0.2 U/dL
pH, UA: 7 (ref 5.0–8.0)

## 2024-02-16 MED ORDER — PHENAZOPYRIDINE HCL 100 MG PO TABS: 100.0000 mg | ORAL_TABLET | Freq: Three times a day (TID) | ORAL | 0 refills | Status: DC | PRN

## 2024-02-16 MED ORDER — NITROFURANTOIN MONOHYD MACRO 100 MG PO CAPS: 100.0000 mg | ORAL_CAPSULE | Freq: Two times a day (BID) | ORAL | 0 refills | Status: DC

## 2024-02-16 NOTE — ED Provider Notes (Signed)
 [facility]    CSN: [id number] Arrival date & time: 02/16/24  1000      History   Chief Complaint Chief Complaint  Patient presents with   Dysuria    HPI Mileidy Creech is a 43 y.o. female.    Dysuria Here for dysuria and urinary frequency and incomplete bladder emptying.  Symptoms began last night.  No nausea or vomiting and no fever or chills.  NKDA  She has not had a period in a while as she gets Depo-Provera injections  Past Medical History:  Diagnosis Date   Anemia    during pregnancy 2006   Asthma    Headache    Hx of chlamydia infection 2016   Hypertension     Patient Active Problem List   Diagnosis Date Noted   Acute cholecystitis 09/30/2023   S/P laparoscopic cholecystectomy 09/28/2023   Choledocholithiasis 09/28/2023   RUQ pain 09/28/2023   Elevated liver enzymes 09/28/2023   Postpartum care and examination 09/16/2019   Prior poor obstetrical history, antepartum 08/10/2019   Encounter for induction of labor 08/10/2019   History of poor fetal growth 05/12/2019   Lewis isoimmunization during pregnancy 02/11/2019   GBS bacteriuria 02/07/2019   Supervision of high risk pregnancy, antepartum 02/02/2019   History of IUFD 02/02/2019   Advanced maternal age in multigravida 02/02/2019   Chronic hypertension complicating pregnancy, antepartum 02/02/2019   Language barrier 10/10/2014    Past Surgical History:  Procedure Laterality Date   CHOLECYSTECTOMY N/A 09/28/2023   Procedure: LAPAROSCOPIC CHOLECYSTECTOMY WITH ICG;  Surgeon: Andria Meuse, MD;  Location: [facility];  Service: General;  Laterality: N/A;   ERCP N/A 09/29/2023   Procedure: ENDOSCOPIC RETROGRADE CHOLANGIOPANCREATOGRAPHY (ERCP);  Surgeon: Iva Boop, MD;  Location: [facility] ENDOSCOPY;  Service: Gastroenterology;  Laterality: N/A;   INTRAOPERATIVE CHOLANGIOGRAM N/A 09/28/2023   Procedure: INTRAOPERATIVE CHOLANGIOGRAM;  Surgeon: Andria Meuse, MD;  Location: [facility];  Service:  General;  Laterality: N/A;   NO PAST SURGERIES     REMOVAL OF STONES  09/29/2023   Procedure: REMOVAL OF STONES;  Surgeon: Iva Boop, MD;  Location: [facility] ENDOSCOPY;  Service: Gastroenterology;;   Dennison Mascot  09/29/2023   Procedure: Dennison Mascot;  Surgeon: Iva Boop, MD;  Location: [facility] ENDOSCOPY;  Service: Gastroenterology;;    OB History     Gravida  5   Para  5   Term  4   Preterm  1   AB  0   Living  4      SAB  0   IAB  0   Ectopic  0   Multiple  0   Live Births  4            Home Medications    Prior to Admission medications   Medication Sig Start Date End Date Taking? Authorizing Provider  nitrofurantoin, macrocrystal-monohydrate, (MACROBID) 100 MG capsule Take 1 capsule (100 mg total) by mouth 2 (two) times daily. 02/16/24  Yes Zenia Resides, MD  phenazopyridine (PYRIDIUM) 100 MG tablet Take 1 tablet (100 mg total) by mouth 3 (three) times daily as needed (urinary pain). 02/16/24  Yes Zenia Resides, MD  acetaminophen (TYLENOL) 500 MG tablet Take 2 tablets (1,000 mg total) by mouth every 6 (six) hours as needed for mild pain (pain score 1-3). 09/30/23   Meuth, Brooke A, PA-C  atorvastatin (LIPITOR) 80 MG tablet Take 1 tablet (80 mg total) by mouth daily. 11/27/23   Grayce Sessions, NP  docusate sodium (COLACE) 100 MG capsule Take 1 capsule (100 mg total) by mouth 2 (two) times daily as needed for mild constipation. Patient not taking: Reported on 02/16/2024 09/30/23   Carlena Bjornstad A, PA-C  hydrochlorothiazide (HYDRODIURIL) 12.5 MG tablet Take 1 tablet (12.5 mg total) by mouth daily. 11/27/23   Grayce Sessions, NP  NIFEdipine (PROCARDIA-XL/NIFEDICAL-XL) 30 MG 24 hr tablet Take 1 tablet (30 mg total) by mouth daily. 11/27/23   Grayce Sessions, NP    Family History Family History  Problem Relation Age of Onset   Hypertension Mother    Alcohol abuse Neg Hx    Arthritis Neg Hx    Asthma Neg Hx    Birth defects Neg Hx     Cancer Neg Hx    COPD Neg Hx    Depression Neg Hx    Diabetes Neg Hx    Drug abuse Neg Hx    Early death Neg Hx    Hearing loss Neg Hx    Heart disease Neg Hx    Hyperlipidemia Neg Hx    Kidney disease Neg Hx    Learning disabilities Neg Hx    Mental illness Neg Hx    Mental retardation Neg Hx    Miscarriages / Stillbirths Neg Hx    Stroke Neg Hx    Vision loss Neg Hx    Varicose Veins Neg Hx     Social History Social History   Tobacco Use   Smoking status: Never   Smokeless tobacco: Never  Vaping Use   Vaping status: Never Used  Substance Use Topics   Alcohol use: No   Drug use: No     Allergies   Patient has no known allergies.   Review of Systems Review of Systems  Genitourinary:  Positive for dysuria.     Physical Exam Triage Vital Signs ED Triage Vitals  Encounter Vitals Group     BP 02/16/24 1026 107/73     Systolic BP Percentile --      Diastolic BP Percentile --      Pulse Rate 02/16/24 1026 81     Resp 02/16/24 1026 16     Temp 02/16/24 1026 98 F (36.7 C)     Temp Source 02/16/24 1026 Oral     SpO2 02/16/24 1026 98 %     Weight --      Height --      Head Circumference --      Peak Flow --      Pain Score 02/16/24 1025 5     Pain Loc --      Pain Education --      Exclude from Growth Chart --    No data found.  Updated Vital Signs BP 107/73 (BP Location: Right Arm)   Pulse 81   Temp 98 F (36.7 C) (Oral)   Resp 16   LMP  (LMP Unknown)   SpO2 98%   Visual Acuity Right Eye Distance:   Left Eye Distance:   Bilateral Distance:    Right Eye Near:   Left Eye Near:    Bilateral Near:     Physical Exam Vitals reviewed.  Constitutional:      General: She is not in acute distress.    Appearance: She is not ill-appearing, toxic-appearing or diaphoretic.  HENT:     Mouth/Throat:     Mouth: Mucous membranes are moist.  Eyes:     Extraocular Movements: Extraocular movements intact.  Conjunctiva/sclera: Conjunctivae normal.      Pupils: Pupils are equal, round, and reactive to light.  Cardiovascular:     Rate and Rhythm: Normal rate and regular rhythm.     Heart sounds: No murmur heard. Pulmonary:     Effort: Pulmonary effort is normal.     Breath sounds: Normal breath sounds.  Abdominal:     General: There is no distension.     Palpations: Abdomen is soft.     Tenderness: There is no abdominal tenderness. There is no guarding.  Musculoskeletal:     Cervical back: Neck supple.  Lymphadenopathy:     Cervical: No cervical adenopathy.  Skin:    Coloration: Skin is not pale.  Neurological:     General: No focal deficit present.     Mental Status: She is alert and oriented to person, place, and time.  Psychiatric:        Behavior: Behavior normal.      UC Treatments / Results  Labs (all labs ordered are listed, but only abnormal results are displayed) Labs Reviewed  POCT URINALYSIS DIP (MANUAL ENTRY) - Abnormal; Notable for the following components:      Result Value   Blood, UA large (*)    Protein Ur, POC >=300 (*)    Leukocytes, UA Moderate (2+) (*)    All other components within normal limits  URINE CULTURE    EKG   Radiology No results found.  Procedures Procedures (including critical care time)  Medications Ordered in UC Medications - No data to display  Initial Impression / Assessment and Plan / UC Course  I have reviewed the triage vital signs and the nursing notes.  Pertinent labs & imaging results that were available during my care of the patient were reviewed by me and considered in my medical decision making (see chart for details).     UA shows moderate amount of leukocytes and large amount of blood.  Nitrofurantoin is sent in to treat UTI and urine culture is sent.  Staff will notify her if it looks like she needs a different antibiotic.   Pyridium is sent in for her symptoms. Final Clinical Impressions(s) / UC Diagnoses   Final diagnoses:  Cystitis      Discharge Instructions      The urinalysis showed some white blood cells and red blood cells, consistent with a bladder infection.  Take nitrofurantoin 100 mg--1 capsule 2 times daily for 5 days  Urine culture is sent, and staff will notify you that it looks like the antibiotic needs to be changed.      ED Prescriptions     Medication Sig Dispense Auth. Provider   nitrofurantoin, macrocrystal-monohydrate, (MACROBID) 100 MG capsule Take 1 capsule (100 mg total) by mouth 2 (two) times daily. 10 capsule Abigael Mogle K, MD   phenazopyridine (PYRIDIUM) 100 MG tablet Take 1 tablet (100 mg total) by mouth 3 (three) times daily as needed (urinary pain). 10 tablet Ellsworth Haas Lovie Zarling K, MD      PDMP not reviewed this encounter.   Ann Keto, MD 02/16/24 1059

## 2024-02-16 NOTE — Discharge Instructions (Signed)
 The urinalysis showed some white blood cells and red blood cells, consistent with a bladder infection.  Take nitrofurantoin 100 mg--1 capsule 2 times daily for 5 days  Urine culture is sent, and staff will notify you that it looks like the antibiotic needs to be changed.

## 2024-02-16 NOTE — ED Triage Notes (Signed)
 Patient here today with c/o burning in urination X 1 day.

## 2024-02-17 LAB — URINE CULTURE: Culture: NO GROWTH

## 2024-02-25 ENCOUNTER — Ambulatory Visit (INDEPENDENT_AMBULATORY_CARE_PROVIDER_SITE_OTHER): Payer: Medicaid Other

## 2024-02-25 DIAGNOSIS — Z30013 Encounter for initial prescription of injectable contraceptive: Secondary | ICD-10-CM | POA: Diagnosis not present

## 2024-02-25 MED ORDER — MEDROXYPROGESTERONE ACETATE 150 MG/ML IM SUSP
150.0000 mg | Freq: Once | INTRAMUSCULAR | Status: AC
  Administered 2024-02-25: 150 mg via INTRAMUSCULAR

## 2024-02-25 NOTE — Progress Notes (Signed)
 Pt came into the office for her depo Pt is on time for shot No HCG needed Depo given in right glute  Next depo is due between July 9- July 23

## 2024-05-06 ENCOUNTER — Ambulatory Visit (HOSPITAL_COMMUNITY)
Admission: EM | Admit: 2024-05-06 | Discharge: 2024-05-06 | Disposition: A | Discharge status: Home / Self Care | Attending: Physician Assistant | Admitting: Physician Assistant

## 2024-05-06 ENCOUNTER — Encounter (HOSPITAL_COMMUNITY): Payer: Self-pay

## 2024-05-06 DIAGNOSIS — N898 Other specified noninflammatory disorders of vagina: Secondary | ICD-10-CM | POA: Insufficient documentation

## 2024-05-06 DIAGNOSIS — N3001 Acute cystitis with hematuria: Secondary | ICD-10-CM | POA: Insufficient documentation

## 2024-05-06 LAB — POCT URINALYSIS DIP (MANUAL ENTRY)
Bilirubin, UA: NEGATIVE
Glucose, UA: NEGATIVE mg/dL
Ketones, POC UA: NEGATIVE mg/dL
Nitrite, UA: NEGATIVE
Protein Ur, POC: NEGATIVE mg/dL
Spec Grav, UA: 1.01 (ref 1.010–1.025)
Urobilinogen, UA: 0.2 U/dL
pH, UA: 6 (ref 5.0–8.0)

## 2024-05-06 LAB — POCT URINE PREGNANCY: Preg Test, Ur: NEGATIVE

## 2024-05-06 MED ORDER — SULFAMETHOXAZOLE-TRIMETHOPRIM 800-160 MG PO TABS: 1.0000 | ORAL_TABLET | Freq: Two times a day (BID) | ORAL | 0 refills | Status: AC

## 2024-05-06 NOTE — Discharge Instructions (Signed)
 We are treating you for urinary tract infection.  Start Bactrim  DS twice daily for 5 days.  If you develop any rash or oral lesions stop the medication to be seen immediately.  I am sending your urine off for culture as well as the swab.  If we need to arrange additional treatment we will let you know.  Make sure you are drinking plenty of fluid.  If anything worsens and you have pelvic pain, abnormal discharge, fever, nausea, vomiting, severe abdominal pain you need to be seen immediately.  You did have some blood in your urine which could be related to the infection.  I recommend you drink lots of fluid and follow-up with your primary care in 2 to 4 weeks to have this repeated to make sure it goes away after we treat the infection.

## 2024-05-06 NOTE — ED Provider Notes (Signed)
 [facility]    CSN: [id number] Arrival date & time: 05/06/24  9164      History   Chief Complaint Chief Complaint  Patient presents with   Urinary Tract Infection    HPI Danielle Hoover is a 43 y.o. female.   Patient presents today with a several hour history of UTI symptoms.  She reports lower back pain, dysuria, frequency, urgency.  She denies any hematuria, abdominal pain, nausea, vomiting, vaginal discharge.  She does report a burning/discomfort in her vagina.  She has no concern for STI and declined testing for that but was open to testing for BV and yeast.  She denies any recent antibiotics.  She was last seen and treated 02/16/2024 with Macrobid  and reports her symptoms resolved with this medication only to recur today.  She has no concern for pregnancy but is open to testing.  She did take Tylenol  without improvement of symptoms.  She denies any recent urogenital procedure, self-catheterization, history of nephrolithiasis.  She denies history of diabetes and does not take an SGLT2 inhibitor.    Past Medical History:  Diagnosis Date   Anemia    during pregnancy 2006   Asthma    Headache    Hx of chlamydia infection 2016   Hypertension     Patient Active Problem List   Diagnosis Date Noted   Acute cholecystitis 09/30/2023   S/P laparoscopic cholecystectomy 09/28/2023   Choledocholithiasis 09/28/2023   RUQ pain 09/28/2023   Elevated liver enzymes 09/28/2023   Postpartum care and examination 09/16/2019   Prior poor obstetrical history, antepartum 08/10/2019   Encounter for induction of labor 08/10/2019   History of poor fetal growth 05/12/2019   Lewis isoimmunization during pregnancy 02/11/2019   GBS bacteriuria 02/07/2019   Supervision of high risk pregnancy, antepartum 02/02/2019   History of IUFD 02/02/2019   Advanced maternal age in multigravida 02/02/2019   Chronic hypertension complicating pregnancy, antepartum 02/02/2019   Language barrier 10/10/2014     Past Surgical History:  Procedure Laterality Date   CHOLECYSTECTOMY N/A 09/28/2023   Procedure: LAPAROSCOPIC CHOLECYSTECTOMY WITH ICG;  Surgeon: Teresa Lonni HERO, MD;  Location: [facility];  Service: General;  Laterality: N/A;   ERCP N/A 09/29/2023   Procedure: ENDOSCOPIC RETROGRADE CHOLANGIOPANCREATOGRAPHY (ERCP);  Surgeon: Avram Lupita BRAVO, MD;  Location[facility] ENDOSCOPY;  Service: Gastroenterology;  Laterality: N/A;   INTRAOPERATIVE CHOLANGIOGRAM N/A 09/28/2023   Procedure: INTRAOPERATIVE CHOLANGIOGRAM;  Surgeon: Teresa Lonni HERO, MD;  Location: [facility];  Service: General;  Laterality: N/A;   NO PAST SURGERIES     REMOVAL OF STONES  09/29/2023   Procedure: REMOVAL OF STONES;  Surgeon: Avram Lupita BRAVO, MD;  Location: [facility] ENDOSCOPY;  Service: Gastroenterology;;   ANNETT  09/29/2023   Procedure: ANNETT;  Surgeon: Avram Lupita BRAVO, MD;  Location: [facility] ENDOSCOPY;  Service: Gastroenterology;;    OB History     Gravida  5   Para  5   Term  4   Preterm  1   AB  0   Living  4      SAB  0   IAB  0   Ectopic  0   Multiple  0   Live Births  4            Home Medications    Prior to Admission medications   Medication Sig Start Date End Date Taking? Authorizing Provider  sulfamethoxazole -trimethoprim  (BACTRIM  DS) 800-160 MG tablet Take 1 tablet by mouth 2 (two) times daily for  5 days. 05/06/24 05/11/24 Yes Ashely Joshua K, PA-C  acetaminophen  (TYLENOL ) 500 MG tablet Take 2 tablets (1,000 mg total) by mouth every 6 (six) hours as needed for mild pain (pain score 1-3). 09/30/23   Meuth, Lyle LABOR, PA-C  atorvastatin  (LIPITOR) 80 MG tablet Take 1 tablet (80 mg total) by mouth daily. 11/27/23   Celestia Rosaline SQUIBB, NP  docusate sodium  (COLACE) 100 MG capsule Take 1 capsule (100 mg total) by mouth 2 (two) times daily as needed for mild constipation. Patient not taking: Reported on 02/16/2024 09/30/23   Doug Lyle LABOR, PA-C  hydrochlorothiazide  (HYDRODIURIL ) 12.5 MG  tablet Take 1 tablet (12.5 mg total) by mouth daily. 11/27/23   Celestia Rosaline SQUIBB, NP  NIFEdipine  (PROCARDIA -XL/NIFEDICAL-XL) 30 MG 24 hr tablet Take 1 tablet (30 mg total) by mouth daily. 11/27/23   Celestia Rosaline SQUIBB, NP    Family History Family History  Problem Relation Age of Onset   Hypertension Mother    Alcohol abuse Neg Hx    Arthritis Neg Hx    Asthma Neg Hx    Birth defects Neg Hx    Cancer Neg Hx    COPD Neg Hx    Depression Neg Hx    Diabetes Neg Hx    Drug abuse Neg Hx    Early death Neg Hx    Hearing loss Neg Hx    Heart disease Neg Hx    Hyperlipidemia Neg Hx    Kidney disease Neg Hx    Learning disabilities Neg Hx    Mental illness Neg Hx    Mental retardation Neg Hx    Miscarriages / Stillbirths Neg Hx    Stroke Neg Hx    Vision loss Neg Hx    Varicose Veins Neg Hx     Social History Social History   Tobacco Use   Smoking status: Never   Smokeless tobacco: Never  Vaping Use   Vaping status: Never Used  Substance Use Topics   Alcohol use: No   Drug use: No     Allergies   Patient has no known allergies.   Review of Systems Review of Systems  Constitutional:  Positive for activity change. Negative for appetite change, fatigue and fever.  Respiratory:  Negative for shortness of breath.   Cardiovascular:  Negative for chest pain.  Gastrointestinal:  Negative for abdominal pain, diarrhea, nausea and vomiting.  Genitourinary:  Positive for dysuria, frequency, urgency and vaginal pain (burning). Negative for vaginal bleeding and vaginal discharge.  Musculoskeletal:  Positive for back pain.     Physical Exam Triage Vital Signs ED Triage Vitals  Encounter Vitals Group     BP 05/06/24 0923 117/75     Girls Systolic BP Percentile --      Girls Diastolic BP Percentile --      Boys Systolic BP Percentile --      Boys Diastolic BP Percentile --      Pulse Rate 05/06/24 0923 75     Resp 05/06/24 0923 18     Temp 05/06/24 0923 98.2 F (36.8 C)      Temp Source 05/06/24 0923 Oral     SpO2 05/06/24 0923 99 %     Weight --      Height --      Head Circumference --      Peak Flow --      Pain Score 05/06/24 0924 5     Pain Loc --      Pain  Education --      Exclude from [employer] Chart --    No data found.  Updated Vital Signs BP 117/75 (BP Location: Right Arm)   Pulse 75   Temp 98.2 F (36.8 C) (Oral)   Resp 18   SpO2 99%   Visual Acuity Right Eye Distance:   Left Eye Distance:   Bilateral Distance:    Right Eye Near:   Left Eye Near:    Bilateral Near:     Physical Exam Vitals reviewed.  Constitutional:      General: She is awake. She is not in acute distress.    Appearance: Normal appearance. She is well-developed. She is not ill-appearing.     Comments: Very pleasant female appears stated age in no acute distress sitting comfortably in exam room  HENT:     Head: Normocephalic and atraumatic.  Cardiovascular:     Rate and Rhythm: Normal rate and regular rhythm.     Heart sounds: Normal heart sounds, S1 normal and S2 normal. No murmur heard. Pulmonary:     Effort: Pulmonary effort is normal.     Breath sounds: Normal breath sounds. No wheezing, rhonchi or rales.     Comments: Clear to auscultation bilaterally Abdominal:     General: Bowel sounds are normal.     Palpations: Abdomen is soft.     Tenderness: There is no abdominal tenderness. There is no right CVA tenderness, left CVA tenderness, guarding or rebound.     Comments: Benign abdominal exam  Psychiatric:        Behavior: Behavior is cooperative.      UC Treatments / Results  Labs (all labs ordered are listed, but only abnormal results are displayed) Labs Reviewed  POCT URINALYSIS DIP (MANUAL ENTRY) - Abnormal; Notable for the following components:      Result Value   Color, UA light yellow (*)    Blood, UA moderate (*)    Leukocytes, UA Moderate (2+) (*)    All other components within normal limits  URINE CULTURE  POCT URINE PREGNANCY   CERVICOVAGINAL ANCILLARY ONLY    EKG   Radiology No results found.  Procedures Procedures (including critical care time)  Medications Ordered in UC Medications - No data to display  Initial Impression / Assessment and Plan / UC Course  I have reviewed the triage vital signs and the nursing notes.  Pertinent labs & imaging results that were available during my care of the patient were reviewed by me and considered in my medical decision making (see chart for details).     Patient is well-appearing, afebrile, nontoxic, nontachycardic.  Vital signs and physical exam are reassuring with no indication for emergent evaluation or imaging.  UA did have blood as well as leukocyte esterase concerning for UTI.  Urine pregnancy was negative.  Will treat for UTI with Bactrim  DS twice daily for 5 days.  She reports some ongoing vaginal irritation we discussed that it is possible that she also has a yeast infection or that a yeast infection could be contributing to her symptoms.  Aptima swab was collected and is pending.  We will contact her if need to arrange additional treatment.  She declined concern for STI so this testing was not included.  She was encouraged to push fluids.  We did discuss that she had blood on her UA and recommend that she follow-up with her primary care within a few weeks to have this repeated to ensure that it resolves after  clearing of infection.  If she has any worsening or changing symptoms she needs to be seen immediately.  Strict return precautions given.  All questions were answered to patient satisfaction.  Excuse note provided.  Final Clinical Impressions(s) / UC Diagnoses   Final diagnoses:  Acute cystitis with hematuria  Vaginal irritation     Discharge Instructions      We are treating you for urinary tract infection.  Start Bactrim  DS twice daily for 5 days.  If you develop any rash or oral lesions stop the medication to be seen immediately.  I am sending  your urine off for culture as well as the swab.  If we need to arrange additional treatment we will let you know.  Make sure you are drinking plenty of fluid.  If anything worsens and you have pelvic pain, abnormal discharge, fever, nausea, vomiting, severe abdominal pain you need to be seen immediately.  You did have some blood in your urine which could be related to the infection.  I recommend you drink lots of fluid and follow-up with your primary care in 2 to 4 weeks to have this repeated to make sure it goes away after we treat the infection.     ED Prescriptions     Medication Sig Dispense Auth. Provider   sulfamethoxazole -trimethoprim  (BACTRIM  DS) 800-160 MG tablet Take 1 tablet by mouth 2 (two) times daily for 5 days. 10 tablet Zavannah Deblois K, PA-C      PDMP not reviewed this encounter.   Sherrell Rocky POUR, PA-C 05/06/24 1006

## 2024-05-06 NOTE — ED Notes (Signed)
 Pt c/o burning on urination with back pain since 4am. Took tylenol  with little relief.

## 2024-05-07 LAB — URINE CULTURE: Culture: NO GROWTH

## 2024-05-10 LAB — CERVICOVAGINAL ANCILLARY ONLY
Bacterial Vaginitis (gardnerella): NEGATIVE
Candida Glabrata: NEGATIVE
Candida Vaginitis: NEGATIVE
Comment: NEGATIVE
Comment: NEGATIVE
Comment: NEGATIVE

## 2024-05-12 ENCOUNTER — Ambulatory Visit (INDEPENDENT_AMBULATORY_CARE_PROVIDER_SITE_OTHER)

## 2024-05-12 DIAGNOSIS — Z30013 Encounter for initial prescription of injectable contraceptive: Secondary | ICD-10-CM

## 2024-05-12 MED ORDER — MEDROXYPROGESTERONE ACETATE 150 MG/ML IM SUSP
150.0000 mg | Freq: Once | INTRAMUSCULAR | Status: AC
  Administered 2024-05-12: 150 mg via INTRAMUSCULAR

## 2024-05-12 NOTE — Progress Notes (Signed)
 Pt came into the office for her depo Pt is on time for shot and tolerated shot well no HCG needed Depo given in left ventrogluteal Next depo is due between  Next depo due sept 24-oct 8

## 2024-05-26 ENCOUNTER — Telehealth (INDEPENDENT_AMBULATORY_CARE_PROVIDER_SITE_OTHER): Payer: Self-pay | Admitting: Primary Care

## 2024-05-26 NOTE — Telephone Encounter (Signed)
 Called pt to remind them about appt. Pt will be present.

## 2024-05-27 ENCOUNTER — Encounter (INDEPENDENT_AMBULATORY_CARE_PROVIDER_SITE_OTHER): Payer: Self-pay | Admitting: Primary Care

## 2024-05-27 ENCOUNTER — Ambulatory Visit (INDEPENDENT_AMBULATORY_CARE_PROVIDER_SITE_OTHER): Payer: Self-pay | Admitting: Primary Care

## 2024-05-27 VITALS — BP 116/81 | HR 72 | Wt 135.0 lb

## 2024-05-27 DIAGNOSIS — Z760 Encounter for issue of repeat prescription: Secondary | ICD-10-CM | POA: Diagnosis not present

## 2024-05-27 DIAGNOSIS — Z9229 Personal history of other drug therapy: Secondary | ICD-10-CM | POA: Diagnosis not present

## 2024-05-27 DIAGNOSIS — Z0184 Encounter for antibody response examination: Secondary | ICD-10-CM

## 2024-05-27 DIAGNOSIS — E782 Mixed hyperlipidemia: Secondary | ICD-10-CM

## 2024-05-27 DIAGNOSIS — E663 Overweight: Secondary | ICD-10-CM

## 2024-05-27 DIAGNOSIS — I10 Essential (primary) hypertension: Secondary | ICD-10-CM | POA: Diagnosis not present

## 2024-05-27 MED ORDER — NIFEDIPINE ER OSMOTIC RELEASE 30 MG PO TB24
30.0000 mg | ORAL_TABLET | Freq: Every day | ORAL | 1 refills | Status: AC
Start: 2024-05-27 — End: ?

## 2024-05-27 MED ORDER — HYDROCHLOROTHIAZIDE 12.5 MG PO TABS: 12.5000 mg | ORAL_TABLET | Freq: Every day | ORAL | 1 refills | Status: AC

## 2024-05-27 NOTE — Progress Notes (Signed)
 [facility]   Danielle Hoover is a 43 y.o. 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  Blood pressure is unremarkable., Denies shortness of breath, headaches, chest pain or lower extremity edema, sudden onset, vision changes, unilateral weakness, dizziness, paresthesias  Patient reports adherence with medications.  Past Medical History:  Diagnosis Date   Anemia    during pregnancy 2006   Asthma    Headache    Hx of chlamydia infection 2016   Hypertension    Past Surgical History:  Procedure Laterality Date   CHOLECYSTECTOMY N/A 09/28/2023   Procedure: LAPAROSCOPIC CHOLECYSTECTOMY WITH ICG;  Surgeon: Teresa Lonni HERO, MD;  Location: [facility] OR;  Service: General;  Laterality: N/A;   ERCP N/A 09/29/2023   Procedure: ENDOSCOPIC RETROGRADE CHOLANGIOPANCREATOGRAPHY (ERCP);  Surgeon: Avram Lupita BRAVO, MD;  Location: [facility] ENDOSCOPY;  Service: Gastroenterology;  Laterality: N/A;   INTRAOPERATIVE CHOLANGIOGRAM N/A 09/28/2023   Procedure: INTRAOPERATIVE CHOLANGIOGRAM;  Surgeon: Teresa Lonni HERO, MD;  Location: [facility] OR;  Service: General;  Laterality: N/A;   NO PAST SURGERIES     REMOVAL OF STONES  09/29/2023   Procedure: REMOVAL OF STONES;  Surgeon: Avram Lupita BRAVO, MD;  Location: [facility] ENDOSCOPY;  Service: Gastroenterology;;   ANNETT  09/29/2023   Procedure: SPHINCTEROTOMY;  Surgeon: Avram Lupita BRAVO, MD;  Location: [facility] ENDOSCOPY;  Service: Gastroenterology;;   No Known Allergies Current Outpatient Medications on File Prior to Visit  Medication Sig Dispense Refill   acetaminophen  (TYLENOL ) 500 MG tablet Take 2 tablets (1,000 mg total) by mouth every 6 (six) hours as needed for mild pain (pain score 1-3).     atorvastatin  (LIPITOR) 80 MG tablet Take 1 tablet (80 mg total) by mouth daily. 90 tablet 1   docusate sodium  (COLACE) 100 MG capsule Take 1 capsule (100 mg total) by mouth 2 (two) times daily as needed for mild constipation.      hydrochlorothiazide  (HYDRODIURIL ) 12.5 MG tablet Take 1 tablet (12.5 mg total) by mouth daily. 90 tablet 1   NIFEdipine  (PROCARDIA -XL/NIFEDICAL-XL) 30 MG 24 hr tablet Take 1 tablet (30 mg total) by mouth daily. 90 tablet 1   No current facility-administered medications on file prior to visit.   Social History   Socioeconomic History   Marital status: Divorced    Spouse name: Not on file   Number of children: Not on file   Years of education: Not on file   Highest education level: Not on file  Occupational History   Not on file  Tobacco Use   Smoking status: Never   Smokeless tobacco: Never  Vaping Use   Vaping status: Never Used  Substance and Sexual Activity   Alcohol use: No   Drug use: No   Sexual activity: Yes    Birth control/protection: None    Comment: last week  Other Topics Concern   Not on file  Social History Narrative   Makes socks   Not married- has a boy friend   Four children. Recent pregnancy    Social Drivers of Corporate investment banker Strain: Low Risk  (05/27/2024)   Overall Financial Resource Strain (CARDIA)    Difficulty of Paying Living Expenses: Not very hard  Food Insecurity: No Food Insecurity (05/27/2024)   Hunger Vital Sign    Worried About Running Out of Food in the Last Year: Never true    Ran Out of Food in the Last Year: Never true  Transportation Needs: No Transportation Needs (05/27/2024)  PRAPARE - Administrator, Civil Service (Medical): No    Lack of Transportation (Non-Medical): No  Physical Activity: Not on file  Stress: No Stress Concern Present (05/27/2024)   [employer] of Occupational Health - Occupational Stress Questionnaire    Feeling of Stress: Not at all  Social Connections: Unknown (05/27/2024)   Social Connection and Isolation Panel    Frequency of Communication with Friends and Family: More than three times a week    Frequency of Social Gatherings with Friends and Family: More than three times a  week    Attends Religious Services: Not on file    Active Member of Clubs or Organizations: Not on file    Attends Banker Meetings: Not on file    Marital Status: Not on file  Intimate Partner Violence: Not At Risk (11/27/2023)   Humiliation, Afraid, Rape, and Kick questionnaire    Fear of Current or Ex-Partner: No    Emotionally Abused: No    Physically Abused: No    Sexually Abused: No   Family History  Problem Relation Age of Onset   Hypertension Mother    Alcohol abuse Neg Hx    Arthritis Neg Hx    Asthma Neg Hx    Birth defects Neg Hx    Cancer Neg Hx    COPD Neg Hx    Depression Neg Hx    Diabetes Neg Hx    Drug abuse Neg Hx    Early death Neg Hx    Hearing loss Neg Hx    Heart disease Neg Hx    Hyperlipidemia Neg Hx    Kidney disease Neg Hx    Learning disabilities Neg Hx    Mental illness Neg Hx    Mental retardation Neg Hx    Miscarriages / Stillbirths Neg Hx    Stroke Neg Hx    Vision loss Neg Hx    Varicose Veins Neg Hx    Health Maintenance  Topic Date Due   COVID-19 Vaccine (1) Never done   Hepatitis B Vaccines (1 of 3 - 19+ 3-dose series) Never done   HPV VACCINES (1 - 3-dose SCDM series) Never done   Cervical Cancer Screening (HPV/Pap Cotest)  04/04/2024   INFLUENZA VACCINE  06/04/2024   DTaP/Tdap/Td (3 - Td or Tdap) 06/06/2029   Hepatitis C Screening  Completed   HIV Screening  Completed   Meningococcal B Vaccine  Aged Out     OBJECTIVE:  Vitals:   05/27/24 0844  BP: 116/81  Pulse: 72  SpO2: 99%  Weight: 135 lb (61.2 kg)    Physical Exam Vitals reviewed.  Constitutional:      Appearance: Normal appearance.     Comments: overweight  HENT:     Head: Normocephalic.     Right Ear: Tympanic membrane, ear canal and external ear normal.     Left Ear: Tympanic membrane, ear canal and external ear normal.     Nose: Nose normal.     Mouth/Throat:     Mouth: Mucous membranes are moist.  Eyes:     Extraocular Movements:  Extraocular movements intact.     Pupils: Pupils are equal, round, and reactive to light.  Cardiovascular:     Rate and Rhythm: Normal rate and regular rhythm.  Pulmonary:     Effort: Pulmonary effort is normal.     Breath sounds: Normal breath sounds.  Abdominal:     General: Bowel sounds are normal.  Palpations: Abdomen is soft.  Musculoskeletal:        General: Normal range of motion.     Cervical back: Normal range of motion.  Skin:    General: Skin is warm and dry.  Neurological:     Mental Status: She is alert and oriented to person, place, and time.  Psychiatric:        Mood and Affect: Mood normal.        Behavior: Behavior normal.        Thought Content: Thought content normal.      ROS  Last 3 Office BP readings: BP Readings from Last 3 Encounters:  05/27/24 116/81  05/06/24 117/75  02/16/24 107/73    BMET    Component Value Date/Time   NA 136 09/30/2023 0623   NA 143 08/27/2023 1108   K 3.6 09/30/2023 0623   CL 103 09/30/2023 0623   CO2 24 09/30/2023 0623   GLUCOSE 119 (H) 09/30/2023 0623   BUN 9 09/30/2023 0623   BUN 11 08/27/2023 1108   CREATININE 0.84 09/30/2023 0623   CALCIUM  8.5 (L) 09/30/2023 0623   GFRNONAA >60 09/30/2023 0623   GFRAA 144 02/02/2019 1604    Renal function: CrCl cannot be calculated (Patient's most recent lab result is older than the maximum 21 days allowed.).  Clinical ASCVD: No  The 10-year ASCVD risk score (Arnett DK, et al., 2019) is: 1%   Values used to calculate the score:     Age: 76 years     Clincally relevant sex: Female     Is Non-Hispanic African American: No     Diabetic: No     Tobacco smoker: No     Systolic Blood Pressure: 116 mmHg     Is BP treated: Yes     HDL Cholesterol: 39 mg/dL     Total Cholesterol: 178 mg/dL  ASCVD risk factors include- [place]  Danielle Hoover was seen today for medical management of chronic issues.  Diagnoses and all orders for this visit:  Has received first dose of hepatitis B  vaccine -     Cancel: Hepatitis B vaccine adult IM  Essential hypertension Well controlled  -Counseled on lifestyle modifications for blood pressure control including reduced dietary sodium, increased exercise, weight reduction and adequate sleep. Also, educated patient about the risk for cardiovascular events, stroke and heart attack. Also counseled patient about the importance of medication adherence. If you participate in smoking, it is important to stop using tobacco as this will increase the risks associated with uncontrolled blood pressure.   -     NIFEdipine  (PROCARDIA -XL/NIFEDICAL-XL) 30 MG 24 hr tablet; Take 1 tablet (30 mg total) by mouth daily. -     hydrochlorothiazide  (HYDRODIURIL ) 12.5 MG tablet; Take 1 tablet (12.5 mg total) by mouth daily.  Medication refill -     NIFEdipine  (PROCARDIA -XL/NIFEDICAL-XL) 30 MG 24 hr tablet; Take 1 tablet (30 mg total) by mouth daily. -     hydrochlorothiazide  (HYDRODIURIL ) 12.5 MG tablet; Take 1 tablet (12.5 mg total) by mouth daily.  Mixed hyperlipidemia -     Lipid Panel  Overweight (BMI 25.0-29.9) Discussed diet and exercise for person with BMI >25. Instructed: You must burn more calories than you eat. Losing 5 percent of your body weight should be considered a success. In the longer term, losing more than 15 percent of your body weight and staying at this weight is an extremely good result. However, keep in mind that even losing 5 percent of your  body weight leads to important health benefits, so try not to get discouraged if you're not able to lose more than this. Will recheck weight in 3-6 months.   Immunity status testing -     Hepatitis B surface antibody,qualitative  This note has been created with Education officer, environmental. Any transcriptional errors are unintentional.   Danielle SHAUNNA Bohr, NP 05/27/2024, 8:52 AM

## 2024-05-27 NOTE — Patient Instructions (Signed)
 Koyuadah ih emong di 1lbs. Sonan brei ih dam bong oh fries-food (mnong bong hua deh hong prai). Kan mnam ea mmih (soda) rei. Brei ih Exercise everyday (brei kpu hiu bi ktang asei mlei ih grap hrue).

## 2024-05-28 LAB — HEPATITIS B SURFACE ANTIBODY,QUALITATIVE: Hep B Surface Ab, Qual: NONREACTIVE

## 2024-05-28 LAB — LIPID PANEL
Chol/HDL Ratio: 3 ratio (ref 0.0–4.4)
Cholesterol, Total: 111 mg/dL (ref 100–199)
HDL: 37 mg/dL — ABNORMAL LOW (ref >39)
LDL Chol Calc (NIH): 51 mg/dL (ref 0–99)
Triglycerides: 130 mg/dL (ref 0–149)
VLDL Cholesterol Cal: 23 mg/dL (ref 5–40)

## 2024-05-29 ENCOUNTER — Other Ambulatory Visit: Payer: Self-pay | Admitting: Primary Care

## 2024-05-29 ENCOUNTER — Ambulatory Visit: Payer: Self-pay | Admitting: Primary Care

## 2024-05-29 DIAGNOSIS — Z760 Encounter for issue of repeat prescription: Secondary | ICD-10-CM

## 2024-05-29 DIAGNOSIS — E782 Mixed hyperlipidemia: Secondary | ICD-10-CM

## 2024-05-29 MED ORDER — ATORVASTATIN CALCIUM 20 MG PO TABS: 20.0000 mg | ORAL_TABLET | Freq: Every day | ORAL | 1 refills | Status: AC

## 2024-06-07 ENCOUNTER — Other Ambulatory Visit (INDEPENDENT_AMBULATORY_CARE_PROVIDER_SITE_OTHER): Payer: Self-pay

## 2024-06-07 MED ORDER — HEPATITIS B VAC RECOMBINANT 5 MCG/0.5ML IJ SUSP: 1.0000 mL | Freq: Once | INTRAMUSCULAR | 0 refills | Status: AC

## 2024-07-08 ENCOUNTER — Telehealth (INDEPENDENT_AMBULATORY_CARE_PROVIDER_SITE_OTHER): Payer: Self-pay | Admitting: Primary Care

## 2024-07-08 NOTE — Telephone Encounter (Signed)
 Called pt to confirm appt. Pt did not answer and LVM

## 2024-07-09 ENCOUNTER — Other Ambulatory Visit (HOSPITAL_COMMUNITY)
Admission: RE | Admit: 2024-07-09 | Discharge: 2024-07-09 | Disposition: A | Discharge status: Home / Self Care | Source: Ambulatory Visit | Attending: Primary Care | Admitting: Primary Care

## 2024-07-09 ENCOUNTER — Ambulatory Visit (INDEPENDENT_AMBULATORY_CARE_PROVIDER_SITE_OTHER): Admitting: Primary Care

## 2024-07-09 ENCOUNTER — Encounter (INDEPENDENT_AMBULATORY_CARE_PROVIDER_SITE_OTHER): Payer: Self-pay | Admitting: Primary Care

## 2024-07-09 VITALS — BP 120/76 | HR 83 | Ht <= 58 in | Wt 131.8 lb

## 2024-07-09 DIAGNOSIS — Z124 Encounter for screening for malignant neoplasm of cervix: Secondary | ICD-10-CM

## 2024-07-09 DIAGNOSIS — Z113 Encounter for screening for infections with a predominantly sexual mode of transmission: Secondary | ICD-10-CM | POA: Insufficient documentation

## 2024-07-09 NOTE — Progress Notes (Signed)
 [facility]  WELL-WOMAN PHYSICAL & PAP Patient name: Danielle Hoover MRN [id number]  Date of birth: 1981-01-12 Chief Complaint:   Weight Check  History of Present Illness:   Danielle Hoover is a 43 y.o. [phone] female being seen today for a routine well-woman exam.      The current method of family planning is depo.  No LMP recorded. Patient has had an injection.  Family h/o breast cancer: No Family h/o colorectal cancer: No  Health Maintenance  Topic Date Due   COVID-19 Vaccine (1) Never done   Pap with HPV screening  04/04/2024   Flu Shot  02/01/2025*   Hepatitis B Vaccine (2 of 3 - 19+ 3-dose series) 07/09/2025*   HPV Vaccine (1 - 3-dose SCDM series) 07/09/2025*   DTaP/Tdap/Td vaccine (3 - Td or Tdap) 06/06/2029   Hepatitis C Screening  Completed   HIV Screening  Completed   Pneumococcal Vaccine  Aged Out   Meningitis B Vaccine  Aged Out  *Topic was postponed. The date shown is not the original due date.   Review of Systems:    Denies any headaches, blurred vision, fatigue, shortness of breath, chest pain, abdominal pain, abnormal vaginal discharge/itching/odor/irritation, problems with periods, bowel movements, urination, or intercourse unless otherwise stated above.  Pertinent History Reviewed:   Reviewed past medical,surgical, social and family history.  Reviewed problem list, medications and allergies.  History Danielle Hoover has a past medical history of Anemia, Asthma, Headache, chlamydia infection (2016), and Hypertension.   Danielle Hoover has a past surgical history that includes No past surgeries; Cholecystectomy (N/A, 09/28/2023); Intraoperative cholangiogram (N/A, 09/28/2023); ERCP (N/A, 09/29/2023); sphincterotomy (09/29/2023); and removal of stones (09/29/2023).   Her family history includes Hypertension in her mother.Danielle Hoover reports that Danielle Hoover has never smoked. Danielle Hoover has never used smokeless tobacco. Danielle Hoover reports that Danielle Hoover does not drink alcohol and does not use drugs.  Current  Outpatient Medications on File Prior to Visit  Medication Sig Dispense Refill   acetaminophen  (TYLENOL ) 500 MG tablet Take 2 tablets (1,000 mg total) by mouth every 6 (six) hours as needed for mild pain (pain score 1-3).     atorvastatin  (LIPITOR) 20 MG tablet Take 1 tablet (20 mg total) by mouth daily. 90 tablet 1   docusate sodium  (COLACE) 100 MG capsule Take 1 capsule (100 mg total) by mouth 2 (two) times daily as needed for mild constipation.     hydrochlorothiazide  (HYDRODIURIL ) 12.5 MG tablet Take 1 tablet (12.5 mg total) by mouth daily. 90 tablet 1   NIFEdipine  (PROCARDIA -XL/NIFEDICAL-XL) 30 MG 24 hr tablet Take 1 tablet (30 mg total) by mouth daily. 90 tablet 1   No current facility-administered medications on file prior to visit.    Physical Assessment:   Vitals:   07/09/24 0842  BP: 120/76  Pulse: 83  SpO2: 100%  Weight: 131 lb 12.8 oz (59.8 kg)  Height: 4' 9 (1.448 m)  Body mass index is 28.52 kg/m.        Physical Examination:  General appearance - well appearing, and in no distress Mental status - alert, oriented to person, place, and time Psych:  Danielle Hoover has a normal mood and affect Skin - warm and dry, normal color, no suspicious lesions noted Chest - effort normal, all lung fields clear to auscultation bilaterally Heart - normal rate and regular rhythm Neck:  midline trachea, no thyromegaly or nodules Breasts - breasts appear normal, no suspicious masses, no skin or nipple changes or axillary nodes Educated patient on proper self  breast examination and had patient to demonstrate SBE. Abdomen - soft, nontender, nondistended, no masses or organomegaly Pelvic-VULVA: normal appearing vulva with no masses, tenderness or lesions   VAGINA: normal appearing vagina with normal color and discharge, no lesions   CERVIX: normal appearing cervix without discharge or lesions, no CMT UTERUS: uterus is felt to be normal size, shape, consistency and nontender  ADNEXA: No adnexal  masses or tenderness noted. Extremities:  No swelling or varicosities noted  No results found for this or any previous visit (from the past 24 hours).   Assessment & Plan:  Danielle Hoover was seen today for weight check.  Diagnoses and all orders for this visit:  Cervical cancer screening -     Cytology - PAP -     Cervicovaginal ancillary only    Follow-up: Return in about 6 months (around 01/06/2025).  This note has been created with Education officer, environmental. Any transcriptional errors are unintentional.   Danielle SHAUNNA Bohr, NP 07/12/2024, 12:39 AM

## 2024-07-12 ENCOUNTER — Encounter (INDEPENDENT_AMBULATORY_CARE_PROVIDER_SITE_OTHER): Payer: Self-pay | Admitting: Primary Care

## 2024-07-12 ENCOUNTER — Ambulatory Visit (INDEPENDENT_AMBULATORY_CARE_PROVIDER_SITE_OTHER): Payer: Self-pay | Admitting: Primary Care

## 2024-07-12 LAB — CERVICOVAGINAL ANCILLARY ONLY
Bacterial Vaginitis (gardnerella): NEGATIVE
Candida Glabrata: NEGATIVE
Candida Vaginitis: NEGATIVE
Chlamydia: NEGATIVE
Comment: NEGATIVE
Comment: NEGATIVE
Comment: NEGATIVE
Comment: NEGATIVE
Comment: NEGATIVE
Comment: NORMAL
Neisseria Gonorrhea: NEGATIVE
Trichomonas: NEGATIVE

## 2024-07-14 LAB — CYTOLOGY - PAP
Comment: NEGATIVE
Diagnosis: NEGATIVE
Diagnosis: REACTIVE
High risk HPV: NEGATIVE

## 2024-07-29 ENCOUNTER — Ambulatory Visit (INDEPENDENT_AMBULATORY_CARE_PROVIDER_SITE_OTHER)

## 2024-08-04 ENCOUNTER — Ambulatory Visit

## 2024-08-04 VITALS — BP 109/55 | HR 79 | Temp 98.0°F | Ht <= 58 in | Wt 131.8 lb

## 2024-08-04 DIAGNOSIS — E785 Hyperlipidemia, unspecified: Secondary | ICD-10-CM | POA: Diagnosis not present

## 2024-08-04 DIAGNOSIS — M545 Low back pain, unspecified: Secondary | ICD-10-CM | POA: Diagnosis not present

## 2024-08-04 DIAGNOSIS — Z0000 Encounter for general adult medical examination without abnormal findings: Secondary | ICD-10-CM | POA: Diagnosis not present

## 2024-08-04 DIAGNOSIS — I10 Essential (primary) hypertension: Secondary | ICD-10-CM | POA: Diagnosis not present

## 2024-08-04 DIAGNOSIS — Z30013 Encounter for initial prescription of injectable contraceptive: Secondary | ICD-10-CM | POA: Diagnosis present

## 2024-08-04 LAB — POCT URINE PREGNANCY: Preg Test, Ur: NEGATIVE

## 2024-08-04 MED ORDER — MEDROXYPROGESTERONE ACETATE 104 MG/0.65ML ~~LOC~~ SUSY
104.0000 mg | PREFILLED_SYRINGE | Freq: Once | SUBCUTANEOUS | Status: AC
  Administered 2024-08-04: 104 mg via SUBCUTANEOUS

## 2024-08-04 NOTE — Assessment & Plan Note (Signed)
 Patient presents with a 4-year on and off history of midline lower back pain.  Due to sudden onset of pain and patient's history of night sweats, although these could be due to menopause but hard to know because patient is on Depo-Provera  shot, will proceed with imaging in the form of lumbar x-ray.  Also told patient to take ibuprofen  400 to 600 mg 4 times a day and Tylenol  1000 mg up to 3 times a day.  Patient can also continue with IcyHot and cushion to help with pain

## 2024-08-04 NOTE — Assessment & Plan Note (Signed)
 Well-controlled on hydrochlorothiazide  12.5 mg and nifedipine  30 mg.  Will get BMP today since patient is on HCTZ

## 2024-08-04 NOTE — Assessment & Plan Note (Addendum)
 Patient does not desire to get pregnant at this time would like to continue with Depo-Provera  shots.  Patient was able to get after pregnancy test was negative

## 2024-08-04 NOTE — Patient Instructions (Addendum)
 Today we discussed the following medical conditions and plan:   Your blood pressure is doing well today.  If you have any lightheadedness or dizziness please let us  know and we can adjust your blood pressure medications if needed.  We will check your kidney function and electrolytes today and I will call you with those results  Because you are due for your Depo-Provera  shot we we will check to make sure you are not pregnant and if you are not then we can give you that shot today  For your back pain you can take ibuprofen  400 to 600 mg 4 times a day and Tylenol  1000 mg up to 3 times a day to help with the pain.  I would continue using the IcyHot and the cushion you were using to help as well.  Because you have been having some night sweats and this back pain started all of a sudden we will get some imaging of your spine.  Someone will call you to schedule that appointment.  We look forward to seeing you next time. Please call our clinic at [phone] if you have any questions or concerns. The best time to call is Monday-Friday from 9am-4pm, but there is someone available 24/7. If you need medication refills, please notify your pharmacy one week in advance and they will send us  a request.   Thank you for trusting me with your care. Wishing you the best! You can call us  to schedule your next Depo-Provera  shot otherwise we will see you back in 6 months  Remonia Romano, DO  [facility]

## 2024-08-04 NOTE — Assessment & Plan Note (Signed)
 Recent lipid panel showed HDL 37 but was otherwise normal with LDL at 50.  Continue Lipitor 20 mg

## 2024-08-04 NOTE — Assessment & Plan Note (Signed)
 Patient is up-to-date on Pap smear which was done 9/5- -negative for intraepithelial lesions and HPV  Patient declined COVID and flu vaccine today  Patient does not have history of mammogram before.  No history of breast cancer in family.  Patient declined scheduling for mammogram today be open to another visit

## 2024-08-04 NOTE — Progress Notes (Addendum)
 New Patient Office Visit  Subjective    Patient ID: Danielle Hoover, female    DOB: Jun 09, 1981  Age: 43 y.o. MRN: [id number]  CC:  Chief Complaint  Patient presents with   Back Pain   New Patient (Initial Visit)    HPI [name] past medical history of hypertension hyperlipidemia presents to establish care today.   Patient presents with acute concern for back pain. Patient states that she has a history of back pain since the time that she gave birth to her last baby about 5 years ago that has been on and off. She also does report a history of numbness in her right leg. Patient states that about a week ago she developed lower  back pain.Patient described it as sharp back pain. No urinary or bowel incontinence. States that the pain she is having now is worse than it has ever been. Patient does endorse night sweats, but states that this has been going on for awhile. Patient is on depo provera  shot and states that she has not had a period since her last child was born 5 years ago so not sure if this is menopause.No weight loss. Patient has tried [place] hot and cushioning almost a week.Tylenol  and ibuprofen ,one in the morning and one at night.  Pain is worse at night laying down and also when getting up in the morning. No position makes it feel better. Denies any trauma that could have caused the pain    PMH Asthma Hypertension Hyperlipidemia   PSH Intraoperative cholangiogram ERCP with sphincterotomy Cholecystectomy  Meds Atorvastatin  20 mg Hydrochlorothiazide  12.5 mg Nifedipine  30 mg Depo Shot  Allergies None  FH Mother-HTN   SH Lives: House, 4 kids  Occupation: Chief Strategy Officer  Function: independent in ADLs and iADLs PCP: referred from Rosaline Bohr NP  Tobacco use: no Alcohol use: no Illicit drug use: no  .new Outpatient Encounter Medications as of 08/04/2024  Medication Sig   acetaminophen  (TYLENOL ) 500 MG tablet Take 2 tablets (1,000 mg total) by mouth every 6 (six) hours as  needed for mild pain (pain score 1-3).   atorvastatin  (LIPITOR) 20 MG tablet Take 1 tablet (20 mg total) by mouth daily.   hydrochlorothiazide  (HYDRODIURIL ) 12.5 MG tablet Take 1 tablet (12.5 mg total) by mouth daily.   NIFEdipine  (PROCARDIA -XL/NIFEDICAL-XL) 30 MG 24 hr tablet Take 1 tablet (30 mg total) by mouth daily.   [DISCONTINUED] docusate sodium  (COLACE) 100 MG capsule Take 1 capsule (100 mg total) by mouth 2 (two) times daily as needed for mild constipation.   [EXPIRED] medroxyPROGESTERone  (DEPO-SUBQ PROVERA  104) injection 104 mg    No facility-administered encounter medications on file as of 08/04/2024.    Past Medical History:  Diagnosis Date   Anemia    during pregnancy 2006   Asthma    Headache    Hx of chlamydia infection 2016   Hypertension     Past Surgical History:  Procedure Laterality Date   CHOLECYSTECTOMY N/A 09/28/2023   Procedure: LAPAROSCOPIC CHOLECYSTECTOMY WITH ICG;  Surgeon: Teresa Lonni HERO, MD;  Location: [facility] OR;  Service: General;  Laterality: N/A;   ERCP N/A 09/29/2023   Procedure: ENDOSCOPIC RETROGRADE CHOLANGIOPANCREATOGRAPHY (ERCP);  Surgeon: Avram Lupita BRAVO, MD;  Location[facility] END[facility]COPY;  Service: Gastroenterology;  Laterality: N/A;   INTRAOPERATIVE CHOLANGIOGRAM N/A 09/28/2023   Procedure: INTRAOPERATIVE CHOLANGIOGRAM;  Surgeon: Teresa Lonni HERO, MD;  Location: [facility] OR;  Service: General;  Laterality: N/A;   NO PAST SURGERIES     REMOVAL OF  STONES  09/29/2023   Procedure: REMOVAL OF STONES;  Surgeon: Avram Lupita BRAVO, MD;  Location: [facility] ENDOSCOPY;  Service: Gastroenterology;;   ANNETT  09/29/2023   Procedure: ANNETT;  Surgeon: Avram Lupita BRAVO, MD;  Location: [facility] ENDOSCOPY;  Service: Gastroenterology;;    Family History  Problem Relation Age of Onset   Hypertension Mother    Alcohol abuse Neg Hx    Arthritis Neg Hx    Asthma Neg Hx    Birth defects Neg Hx    Cancer Neg Hx    COPD Neg Hx    Depression Neg Hx    Diabetes Neg  Hx    Drug abuse Neg Hx    Early death Neg Hx    Hearing loss Neg Hx    Heart disease Neg Hx    Hyperlipidemia Neg Hx    Kidney disease Neg Hx    Learning disabilities Neg Hx    Mental illness Neg Hx    Mental retardation Neg Hx    Miscarriages / Stillbirths Neg Hx    Stroke Neg Hx    Vision loss Neg Hx    Varicose Veins Neg Hx     Social History   Socioeconomic History   Marital status: Divorced    Spouse name: Not on file   Number of children: Not on file   Years of education: Not on file   Highest education level: Not on file  Occupational History   Not on file  Tobacco Use   Smoking status: Never   Smokeless tobacco: Never  Vaping Use   Vaping status: Never Used  Substance and Sexual Activity   Alcohol use: No   Drug use: No   Sexual activity: Yes    Birth control/protection: None    Comment: last week  Other Topics Concern   Not on file  Social History Narrative   Makes socks   Not married- has a boy friend   Four children. Recent pregnancy    Social Drivers of Corporate investment banker Strain: Low Risk  (05/27/2024)   Overall Financial Resource Strain (CARDIA)    Difficulty of Paying Living Expenses: Not very hard  Food Insecurity: No Food Insecurity (05/27/2024)   Hunger Vital Sign    Worried About Running Out of Food in the Last Year: Never true    Ran Out of Food in the Last Year: Never true  Transportation Needs: No Transportation Needs (05/27/2024)   PRAPARE - Administrator, Civil Service (Medical): No    Lack of Transportation (Non-Medical): No  Physical Activity: Not on file  Stress: No Stress Concern Present (05/27/2024)   [employer] of Occupational Health - Occupational Stress Questionnaire    Feeling of Stress: Not at all  Social Connections: Unknown (05/27/2024)   Social Connection and Isolation Panel    Frequency of Communication with Friends and Family: More than three times a week    Frequency of Social Gatherings  with Friends and Family: More than three times a week    Attends Religious Services: Not on file    Active Member of Clubs or Organizations: Not on file    Attends Banker Meetings: Not on file    Marital Status: Not on file  Intimate Partner Violence: Not At Risk (11/27/2023)   Humiliation, Afraid, Rape, and Kick questionnaire    Fear of Current or Ex-Partner: No    Emotionally Abused: No    Physically Abused: No  Sexually Abused: No    ROS Noted in HPI     Objective    BP (!) 109/55 (BP Location: Right Arm, Patient Position: Sitting, Cuff Size: Small)   Pulse 79   Temp 98 F (36.7 C) (Oral)   Ht 4' 9 (1.448 m)   Wt 131 lb 12.8 oz (59.8 kg)   SpO2 98%   BMI 28.52 kg/m   Physical Exam Constitutional: Alert, no acute distress Heart: Regular rate and rhythm, no murmurs auscultated Lungs: Clear to auscultation, respiratory effort normal Abdomen: Bowel sounds present, tenderness to palpation in the epigastrium but no rebound or guarding MSK: Lumbar spinal tenderness midline, mild paraspinal tenderness just next to midline bilaterally.  No erythema, rashes noted.  No step-off deformity appreciated.  Straight leg raise negative bilaterally      Assessment & Plan:   Problem List Items Addressed This Visit     Postpartum care and examination   Patient does not desire to get pregnant at this time would like to continue with Depo-Provera  shots.  Patient was able to get after pregnancy test was negative      Relevant Orders   POCT Urine Pregnancy (Completed)   Essential hypertension - Primary   Well-controlled on hydrochlorothiazide  12.5 mg and nifedipine  30 mg.  Will get BMP today since patient is on HCTZ      Relevant Orders   Basic metabolic panel with GFR   Midline low back pain   Patient presents with a 4-year on and off history of midline lower back pain.  Due to sudden onset of pain and patient's history of night sweats, although these could be due  to menopause but hard to know because patient is on Depo-Provera  shot, will proceed with imaging in the form of lumbar x-ray.  Also told patient to take ibuprofen  400 to 600 mg 4 times a day and Tylenol  1000 mg up to 3 times a day.  Patient can also continue with IcyHot and cushion to help with pain        Relevant Orders   DG Lumbar Spine Complete   Hyperlipidemia   Recent lipid panel showed HDL 37 but was otherwise normal with LDL at 50.  Continue Lipitor 20 mg      Healthcare maintenance   Patient is up-to-date on Pap smear which was done 9/5- -negative for intraepithelial lesions and HPV  Patient declined COVID and flu vaccine today  Patient does not have history of mammogram before.  No history of breast cancer in family.  Patient declined scheduling for mammogram today be open to another visit        Return if symptoms worsen or fail to improve.  Will follow up results of imaging with patient and at that time determine follow up interval.   Flonnie Wierman D'Mello, DO  Patient seen with Dr. Karna

## 2024-08-05 ENCOUNTER — Ambulatory Visit: Payer: Self-pay

## 2024-08-05 LAB — BASIC METABOLIC PANEL WITH GFR
BUN/Creatinine Ratio: 16 (ref 9–23)
BUN: 11 mg/dL (ref 6–24)
CO2: 24 mmol/L (ref 20–29)
Calcium: 9.6 mg/dL (ref 8.7–10.2)
Chloride: 103 mmol/L (ref 96–106)
Creatinine, Ser: 0.68 mg/dL (ref 0.57–1.00)
Glucose: 75 mg/dL (ref 70–99)
Potassium: 3.4 mmol/L — ABNORMAL LOW (ref 3.5–5.2)
Sodium: 144 mmol/L (ref 134–144)
eGFR: 112 mL/min/1.73 (ref >59)

## 2024-08-05 NOTE — Progress Notes (Signed)
 Internal Medicine Clinic Attending  I was physically present during the key portions of the resident provided service and participated in the medical decision making of patient's management care. I reviewed pertinent patient test results.  The assessment, diagnosis, and plan were formulated together and I agree with the documentation in the resident's note.  Dickie La, MD

## 2024-08-05 NOTE — Addendum Note (Signed)
 Addended by: KARNA FELLOWS on: 08/05/2024 09:01 AM   Modules accepted: Level of Service

## 2024-10-22 ENCOUNTER — Ambulatory Visit

## 2024-11-06 ENCOUNTER — Ambulatory Visit (HOSPITAL_COMMUNITY)
Admission: EM | Admit: 2024-11-06 | Discharge: 2024-11-06 | Disposition: A | Discharge status: Home / Self Care | Attending: Emergency Medicine | Admitting: Emergency Medicine

## 2024-11-06 ENCOUNTER — Encounter (HOSPITAL_COMMUNITY): Payer: Self-pay | Admitting: *Deleted

## 2024-11-06 DIAGNOSIS — J101 Influenza due to other identified influenza virus with other respiratory manifestations: Secondary | ICD-10-CM | POA: Diagnosis not present

## 2024-11-06 LAB — POCT INFLUENZA A/B
Influenza A, POC: POSITIVE — AB
Influenza B, POC: NEGATIVE

## 2024-11-06 MED ORDER — OSELTAMIVIR PHOSPHATE 75 MG PO CAPS: 75.0000 mg | ORAL_CAPSULE | Freq: Two times a day (BID) | ORAL | 0 refills | Status: AC

## 2024-11-06 MED ORDER — ACETAMINOPHEN 325 MG PO TABS
ORAL_TABLET | ORAL | Status: AC
  Filled 2024-11-06: qty 2

## 2024-11-06 MED ORDER — FLUTICASONE PROPIONATE 50 MCG/ACT NA SUSP: 1.0000 | Freq: Every day | NASAL | 0 refills | Status: AC

## 2024-11-06 MED ORDER — IBUPROFEN 800 MG PO TABS: 800.0000 mg | ORAL_TABLET | Freq: Three times a day (TID) | ORAL | 0 refills | Status: AC

## 2024-11-06 MED ORDER — ACETAMINOPHEN 325 MG PO TABS
650.0000 mg | ORAL_TABLET | Freq: Once | ORAL | Status: AC
  Administered 2024-11-06: 650 mg via ORAL

## 2024-11-06 MED ORDER — PROMETHAZINE-DM 6.25-15 MG/5ML PO SYRP: 5.0000 mL | ORAL_SOLUTION | Freq: Four times a day (QID) | ORAL | 0 refills | Status: AC | PRN

## 2024-11-06 NOTE — Discharge Instructions (Addendum)
 You tested positive for influenza A today in clinic, this is a viral illness.  Typical viral illnesses last 5 to 7 days.  You can take the Tamiflu  twice daily for the next 5 days to help cut down on how long you are sick with the flu.  Alternate between Tylenol  and ibuprofen  every 4-6 hours to help with any fever, body aches and chills.  Ensure you are staying well-hydrated and getting plenty of rest. For any prolonged symptoms or if new concerning symptoms seek follow-up care.  Cough syrup as needed, can cause drowsiness. Use nasal spray for congestion.   Return to clinic for prolonged symptoms or new urgent symptoms.

## 2024-11-06 NOTE — ED Triage Notes (Signed)
 Pt states she has fever, body aches, cough, congestion since yesterday. She has taken advil  this 9am

## 2024-11-06 NOTE — ED Provider Notes (Signed)
 " [facility]    CSN: [id number] Arrival date & time: 11/06/24  1753      History   Chief Complaint Chief Complaint  Patient presents with   Cough   Nasal Congestion   Fever   Generalized Body Aches    HPI Danielle Hoover is a 44 y.o. female.   Patient presents to clinic over concern of fever, body aches, cough, nasal congestion and feeling unwell.  Symptoms started yesterday.  Yesterday patient started with cough, congestion, and body aches  Hard to breathe through her nose, no wheezing or shortness of breath  Has had recent sick contacts, daughter went to ED and was told she is anemic.  Took Advil  this morning, tried to go to work earlier but had to leave due to how she was feeling  The history is provided by the patient and medical records.  Cough Fever   Past Medical History:  Diagnosis Date   Anemia    during pregnancy 2006   Asthma    Headache    Hx of chlamydia infection 2016   Hypertension     Patient Active Problem List   Diagnosis Date Noted   Essential hypertension 08/04/2024   Midline low back pain 08/04/2024   Hyperlipidemia 08/04/2024   Healthcare maintenance 08/04/2024   Acute cholecystitis 09/30/2023   S/P laparoscopic cholecystectomy 09/28/2023   Choledocholithiasis 09/28/2023   Elevated liver enzymes 09/28/2023   Postpartum care and examination 09/16/2019   Prior poor obstetrical history, antepartum 08/10/2019   History of poor fetal growth 05/12/2019   Lewis isoimmunization during pregnancy 02/11/2019   GBS bacteriuria 02/07/2019   Supervision of high risk pregnancy, antepartum 02/02/2019   History of IUFD 02/02/2019   Advanced maternal age in multigravida 02/02/2019   Chronic hypertension complicating pregnancy, antepartum 02/02/2019   Language barrier 10/10/2014    Past Surgical History:  Procedure Laterality Date   CHOLECYSTECTOMY N/A 09/28/2023   Procedure: LAPAROSCOPIC CHOLECYSTECTOMY WITH ICG;  Surgeon: Teresa Lonni HERO, MD;  Location: [facility] OR;  Service: General;  Laterality: N/A;   ERCP N/A 09/29/2023   Procedure: ENDOSCOPIC RETROGRADE CHOLANGIOPANCREATOGRAPHY (ERCP);  Surgeon: Avram Lupita BRAVO, MD;  Location: [facility] ENDOSCOPY;  Service: Gastroenterology;  Laterality: N/A;   INTRAOPERATIVE CHOLANGIOGRAM N/A 09/28/2023   Procedure: INTRAOPERATIVE CHOLANGIOGRAM;  Surgeon: Teresa Lonni HERO, MD;  Location: [facility] OR;  Service: General;  Laterality: N/A;   NO PAST SURGERIES     REMOVAL OF STONES  09/29/2023   Procedure: REMOVAL OF STONES;  Surgeon: Avram Lupita BRAVO, MD;  Location: [facility] ENDOSCOPY;  Service: Gastroenterology;;   ANNETT  09/29/2023   Procedure: ANNETT;  Surgeon: Avram Lupita BRAVO, MD;  Location: [facility] ENDOSCOPY;  Service: Gastroenterology;;    OB History     Gravida  5   Para  5   Term  4   Preterm  1   AB  0   Living  4      SAB  0   IAB  0   Ectopic  0   Multiple  0   Live Births  4            Home Medications    Prior to Admission medications  Medication Sig Start Date End Date Taking? Authorizing Provider  atorvastatin  (LIPITOR) 20 MG tablet Take 1 tablet (20 mg total) by mouth daily. 05/29/24  Yes Celestia Rosaline SQUIBB, NP  fluticasone  (FLONASE ) 50 MCG/ACT nasal spray Place 1 spray into both nostrils daily. 11/06/24  Yes Dreama,  Marialy Urbanczyk  N, FNP  hydrochlorothiazide  (HYDRODIURIL ) 12.5 MG tablet Take 1 tablet (12.5 mg total) by mouth daily. 05/27/24  Yes Celestia Rosaline SQUIBB, NP  ibuprofen  (ADVIL ) 800 MG tablet Take 1 tablet (800 mg total) by mouth 3 (three) times daily. 11/06/24  Yes Cleavon Goldman  N, FNP  NIFEdipine  (PROCARDIA -XL/NIFEDICAL-XL) 30 MG 24 hr tablet Take 1 tablet (30 mg total) by mouth daily. 05/27/24  Yes Celestia Rosaline SQUIBB, NP  oseltamivir  (TAMIFLU ) 75 MG capsule Take 1 capsule (75 mg total) by mouth every 12 (twelve) hours. 11/06/24  Yes Vivan Vanderveer  N, FNP  promethazine -dextromethorphan (PROMETHAZINE -DM) 6.25-15 MG/5ML syrup Take 5 mLs by mouth 4  (four) times daily as needed for cough. 11/06/24  Yes Shekia Kuper  N, FNP  acetaminophen  (TYLENOL ) 500 MG tablet Take 2 tablets (1,000 mg total) by mouth every 6 (six) hours as needed for mild pain (pain score 1-3). 09/30/23   Meuth, Lyle LABOR, PA-C    Family History Family History  Problem Relation Age of Onset   Hypertension Mother    Alcohol abuse Neg Hx    Arthritis Neg Hx    Asthma Neg Hx    Birth defects Neg Hx    Cancer Neg Hx    COPD Neg Hx    Depression Neg Hx    Diabetes Neg Hx    Drug abuse Neg Hx    Early death Neg Hx    Hearing loss Neg Hx    Heart disease Neg Hx    Hyperlipidemia Neg Hx    Kidney disease Neg Hx    Learning disabilities Neg Hx    Mental illness Neg Hx    Mental retardation Neg Hx    Miscarriages / Stillbirths Neg Hx    Stroke Neg Hx    Vision loss Neg Hx    Varicose Veins Neg Hx     Social History Social History[1]   Allergies   Patient has no known allergies.   Review of Systems Review of Systems  Per HPI  Physical Exam Triage Vital Signs ED Triage Vitals  Encounter Vitals Group     BP 11/06/24 1848 129/74     Girls Systolic BP Percentile --      Girls Diastolic BP Percentile --      Boys Systolic BP Percentile --      Boys Diastolic BP Percentile --      Pulse Rate 11/06/24 1848 (!) 118     Resp 11/06/24 1848 18     Temp 11/06/24 1848 (!) 102.4 F (39.1 C)     Temp Source 11/06/24 1848 Oral     SpO2 11/06/24 1848 98 %     Weight --      Height --      Head Circumference --      Peak Flow --      Pain Score 11/06/24 1846 0     Pain Loc --      Pain Education --      Exclude from Growth Chart --    No data found.  Updated Vital Signs BP 129/74 (BP Location: Right Arm)   Pulse (!) 118   Temp (!) 102.4 F (39.1 C) (Oral)   Resp 18   LMP  (LMP Unknown) Comment: no cycle with depo  SpO2 98%   Visual Acuity Right Eye Distance:   Left Eye Distance:   Bilateral Distance:    Right Eye Near:   Left Eye Near:     Bilateral Near:  Physical Exam Vitals and nursing note reviewed.  Constitutional:      Appearance: Normal appearance.  HENT:     Head: Normocephalic and atraumatic.     Right Ear: External ear normal.     Left Ear: External ear normal.     Nose: Congestion and rhinorrhea present.     Mouth/Throat:     Mouth: Mucous membranes are moist.     Pharynx: Posterior oropharyngeal erythema present.  Eyes:     Conjunctiva/sclera: Conjunctivae normal.  Cardiovascular:     Rate and Rhythm: Regular rhythm. Tachycardia present.     Heart sounds: Normal heart sounds. No murmur heard. Pulmonary:     Effort: Pulmonary effort is normal. No respiratory distress.     Breath sounds: Normal breath sounds. No wheezing.  Neurological:     General: No focal deficit present.     Mental Status: She is alert.  Psychiatric:        Mood and Affect: Mood normal.      UC Treatments / Results  Labs (all labs ordered are listed, but only abnormal results are displayed) Labs Reviewed  POCT INFLUENZA A/B - Abnormal; Notable for the following components:      Result Value   Influenza A, POC Positive (*)    All other components within normal limits    EKG   Radiology No results found.  Procedures Procedures (including critical care time)  Medications Ordered in UC Medications  acetaminophen  (TYLENOL ) tablet 650 mg (650 mg Oral Given 11/06/24 1851)    Initial Impression / Assessment and Plan / UC Course  I have reviewed the triage vital signs and the nursing notes.  Pertinent labs & imaging results that were available during my care of the patient were reviewed by me and considered in my medical decision making (see chart for details).  Vitals and triage reviewed, patient is hemodynamically stable.  Lungs vesicular, heart with regular rate and rhythm, mild tachycardia.  Most likely from fever, given Tylenol .  Congestion, rhinorrhea and postnasal drip present.  POC testing positive for  influenza A.  Tamiflu  sent in.  Symptomatic management viral illness discussed.  Plan of care, follow-up care and return precautions given, no questions at this time.    Final Clinical Impressions(s) / UC Diagnoses   Final diagnoses:  Influenza A     Discharge Instructions      You tested positive for influenza A today in clinic, this is a viral illness.  Typical viral illnesses last 5 to 7 days.  You can take the Tamiflu  twice daily for the next 5 days to help cut down on how long you are sick with the flu.  Alternate between Tylenol  and ibuprofen  every 4-6 hours to help with any fever, body aches and chills.  Ensure you are staying well-hydrated and getting plenty of rest. For any prolonged symptoms or if new concerning symptoms seek follow-up care.  Cough syrup as needed, can cause drowsiness. Use nasal spray for congestion.   Return to clinic for prolonged symptoms or new urgent symptoms.       ED Prescriptions     Medication Sig Dispense Auth. Provider   oseltamivir  (TAMIFLU ) 75 MG capsule Take 1 capsule (75 mg total) by mouth every 12 (twelve) hours. 10 capsule Dreama, Carlo Lorson  N, FNP   ibuprofen  (ADVIL ) 800 MG tablet Take 1 tablet (800 mg total) by mouth 3 (three) times daily. 30 tablet Dreama, Jhene Westmoreland  N, FNP   fluticasone  (FLONASE ) 50 MCG/ACT nasal spray  Place 1 spray into both nostrils daily. 9.9 mL Dreama, Amunique Neyra  N, FNP   promethazine -dextromethorphan (PROMETHAZINE -DM) 6.25-15 MG/5ML syrup Take 5 mLs by mouth 4 (four) times daily as needed for cough. 118 mL Dreama, Millicent Blazejewski  N, FNP      PDMP not reviewed this encounter.     [1]  Social History Tobacco Use   Smoking status: Never   Smokeless tobacco: Never  Vaping Use   Vaping status: Never Used  Substance Use Topics   Alcohol use: No   Drug use: No     Dreama Wallace SAILOR, FNP 11/06/24 1921  "

## 2025-01-03 ENCOUNTER — Ambulatory Visit (INDEPENDENT_AMBULATORY_CARE_PROVIDER_SITE_OTHER): Admitting: Primary Care
